# Patient Record
Sex: Female | Born: 1940 | Race: White | Hispanic: No | Marital: Married | Smoking: Former smoker
Health system: Southern US, Community
[De-identification: ages and names within clinical notes are randomized; demographics above are authoritative.]

## PROBLEM LIST (undated history)

## (undated) ENCOUNTER — Emergency Department (HOSPITAL_COMMUNITY): Payer: Medicare Other

## (undated) ENCOUNTER — Emergency Department (HOSPITAL_COMMUNITY): Admission: EM | Payer: Medicare Other | Source: Home / Self Care

## (undated) DIAGNOSIS — I10 Essential (primary) hypertension: Secondary | ICD-10-CM

## (undated) DIAGNOSIS — G629 Polyneuropathy, unspecified: Secondary | ICD-10-CM

## (undated) DIAGNOSIS — E039 Hypothyroidism, unspecified: Secondary | ICD-10-CM

## (undated) DIAGNOSIS — G8929 Other chronic pain: Secondary | ICD-10-CM

## (undated) DIAGNOSIS — I82409 Acute embolism and thrombosis of unspecified deep veins of unspecified lower extremity: Secondary | ICD-10-CM

## (undated) DIAGNOSIS — K3184 Gastroparesis: Secondary | ICD-10-CM

## (undated) DIAGNOSIS — I219 Acute myocardial infarction, unspecified: Secondary | ICD-10-CM

## (undated) DIAGNOSIS — K529 Noninfective gastroenteritis and colitis, unspecified: Secondary | ICD-10-CM

## (undated) DIAGNOSIS — M1990 Unspecified osteoarthritis, unspecified site: Secondary | ICD-10-CM

## (undated) DIAGNOSIS — E7800 Pure hypercholesterolemia, unspecified: Secondary | ICD-10-CM

## (undated) DIAGNOSIS — I2699 Other pulmonary embolism without acute cor pulmonale: Secondary | ICD-10-CM

## (undated) DIAGNOSIS — I2510 Atherosclerotic heart disease of native coronary artery without angina pectoris: Secondary | ICD-10-CM

## (undated) DIAGNOSIS — Z86718 Personal history of other venous thrombosis and embolism: Secondary | ICD-10-CM

## (undated) DIAGNOSIS — M62838 Other muscle spasm: Secondary | ICD-10-CM

## (undated) DIAGNOSIS — K5900 Constipation, unspecified: Secondary | ICD-10-CM

## (undated) DIAGNOSIS — I4891 Unspecified atrial fibrillation: Secondary | ICD-10-CM

## (undated) DIAGNOSIS — K219 Gastro-esophageal reflux disease without esophagitis: Secondary | ICD-10-CM

## (undated) HISTORY — PX: TONSILLECTOMY: SUR1361

## (undated) HISTORY — PX: WRIST SURGERY: SHX841

## (undated) HISTORY — PX: ANKLE SURGERY: SHX546

## (undated) HISTORY — PX: ABDOMINAL HYSTERECTOMY: SHX81

## (undated) HISTORY — PX: OTHER SURGICAL HISTORY: SHX169

## (undated) HISTORY — PX: CARDIAC SURGERY: SHX584

## (undated) HISTORY — PX: JOINT REPLACEMENT: SHX530

---

## 1999-01-08 ENCOUNTER — Encounter: Payer: Self-pay | Admitting: Gastroenterology

## 1999-01-08 ENCOUNTER — Ambulatory Visit (HOSPITAL_COMMUNITY): Admission: RE | Admit: 1999-01-08 | Discharge: 1999-01-08 | Payer: Self-pay | Admitting: Anesthesiology

## 1999-01-19 ENCOUNTER — Observation Stay (HOSPITAL_COMMUNITY): Admission: EM | Admit: 1999-01-19 | Discharge: 1999-01-20 | Payer: Self-pay | Admitting: Gastroenterology

## 1999-01-19 ENCOUNTER — Encounter: Payer: Self-pay | Admitting: Gastroenterology

## 2012-01-12 ENCOUNTER — Encounter (HOSPITAL_BASED_OUTPATIENT_CLINIC_OR_DEPARTMENT_OTHER): Payer: Self-pay | Admitting: *Deleted

## 2012-01-12 ENCOUNTER — Emergency Department (HOSPITAL_BASED_OUTPATIENT_CLINIC_OR_DEPARTMENT_OTHER)
Admission: EM | Admit: 2012-01-12 | Discharge: 2012-01-13 | Disposition: A | Discharge status: Home / Self Care | Payer: Medicare Other | Attending: Emergency Medicine | Admitting: Emergency Medicine

## 2012-01-12 ENCOUNTER — Emergency Department (INDEPENDENT_AMBULATORY_CARE_PROVIDER_SITE_OTHER): Payer: Medicare Other

## 2012-01-12 DIAGNOSIS — E119 Type 2 diabetes mellitus without complications: Secondary | ICD-10-CM | POA: Insufficient documentation

## 2012-01-12 DIAGNOSIS — Z794 Long term (current) use of insulin: Secondary | ICD-10-CM | POA: Insufficient documentation

## 2012-01-12 DIAGNOSIS — I10 Essential (primary) hypertension: Secondary | ICD-10-CM | POA: Insufficient documentation

## 2012-01-12 DIAGNOSIS — R7309 Other abnormal glucose: Secondary | ICD-10-CM

## 2012-01-12 DIAGNOSIS — Z9641 Presence of insulin pump (external) (internal): Secondary | ICD-10-CM | POA: Insufficient documentation

## 2012-01-12 DIAGNOSIS — I517 Cardiomegaly: Secondary | ICD-10-CM

## 2012-01-12 DIAGNOSIS — R739 Hyperglycemia, unspecified: Secondary | ICD-10-CM

## 2012-01-12 HISTORY — DX: Essential (primary) hypertension: I10

## 2012-01-12 HISTORY — DX: Unspecified osteoarthritis, unspecified site: M19.90

## 2012-01-12 HISTORY — DX: Gastroparesis: K31.84

## 2012-01-12 HISTORY — DX: Polyneuropathy, unspecified: G62.9

## 2012-01-12 HISTORY — DX: Atherosclerotic heart disease of native coronary artery without angina pectoris: I25.10

## 2012-01-12 HISTORY — DX: Other chronic pain: G89.29

## 2012-01-12 HISTORY — DX: Noninfective gastroenteritis and colitis, unspecified: K52.9

## 2012-01-12 HISTORY — DX: Gastro-esophageal reflux disease without esophagitis: K21.9

## 2012-01-12 LAB — CBC
MCH: 29.8 pg (ref 26.0–34.0)
MCHC: 33.2 g/dL (ref 30.0–36.0)
MCV: 89.7 fL (ref 78.0–100.0)
Platelets: 235 10*3/uL (ref 150–400)
RBC: 3.59 MIL/uL — ABNORMAL LOW (ref 3.87–5.11)

## 2012-01-12 LAB — BASIC METABOLIC PANEL
CO2: 30 mEq/L (ref 19–32)
Calcium: 9.3 mg/dL (ref 8.4–10.5)
Creatinine, Ser: 1.2 mg/dL — ABNORMAL HIGH (ref 0.50–1.10)
GFR calc non Af Amer: 45 mL/min — ABNORMAL LOW (ref >90)
Sodium: 134 mEq/L — ABNORMAL LOW (ref 135–145)

## 2012-01-12 LAB — GLUCOSE, CAPILLARY

## 2012-01-12 LAB — URINALYSIS, ROUTINE W REFLEX MICROSCOPIC
Glucose, UA: >1000 mg/dL — AB
Hgb urine dipstick: NEGATIVE
Ketones, ur: NEGATIVE mg/dL
Leukocytes, UA: NEGATIVE
Protein, ur: NEGATIVE mg/dL
Urobilinogen, UA: 0.2 mg/dL (ref 0.0–1.0)

## 2012-01-12 MED ORDER — SODIUM CHLORIDE 0.9 % IV SOLN: INTRAVENOUS | Status: DC

## 2012-01-12 MED ORDER — SODIUM CHLORIDE 0.9 % IV BOLUS (SEPSIS)
500.0000 mL | Freq: Once | INTRAVENOUS | Status: AC
  Administered 2012-01-12: 500 mL via INTRAVENOUS

## 2012-01-12 MED ORDER — INSULIN REGULAR HUMAN 100 UNIT/ML IJ SOLN
10.0000 [IU] | Freq: Once | INTRAMUSCULAR | Status: AC
  Administered 2012-01-12: 10 [IU] via SUBCUTANEOUS
  Filled 2012-01-12: qty 3

## 2012-01-12 NOTE — ED Notes (Signed)
Yesterday she was nauseated. Her sugar was over 600. She was able to get it down but it keeps going back up. Today she called her MD and she was told to come to the ED.

## 2012-01-12 NOTE — ED Notes (Signed)
Pt reports trouble keeping BS regulated states that it has gotten above 600 x 2 in last 2 days called PCP and was told to come to ED for assessment pt reports recent stomach virus and some nausea denies vomiting in last 48 hours

## 2012-01-12 NOTE — ED Provider Notes (Signed)
History     CSN: [id number]  Arrival date & time 01/12/12  2005   First MD Initiated Contact with Patient 01/12/12 2029      Chief Complaint  Patient presents with  . Blood Sugar Problem    (Consider location/radiation/quality/duration/timing/severity/associated sxs/prior treatment) The history is provided by the patient.   patient is a 71 year old female with known history of diabetes takes long-acting insulin for the diabetes for the past couple days she's been having trouble controlling her sugars in the range of 300- 600 no systemic symptoms no bowel pain no chest pain no shortness of breath no dysuria no fever no nausea vomiting or diarrhea.  Patient is followed by a local endocrinologist. Past Medical History  Diagnosis Date  . Diabetes mellitus   . Hypertension   . Coronary artery disease   . Colitis   . Neuropathy   . Chronic pain   . GERD (gastroesophageal reflux disease)   . Gastroparesis   . Fibrocystic breast   . Thyroid disease   . Arthritis     Past Surgical History  Procedure Date  . Abdominal hysterectomy   . Cardiac surgery   . Tonsillectomy   . Cardiac surgery   . Ankle surgery   . Wrist surgery   . Cateract surgery     No family history on file.  History  Substance Use Topics  . Smoking status: Never Smoker   . Smokeless tobacco: Not on file  . Alcohol Use: No    OB History    Grav Para Term Preterm Abortions TAB SAB Ect Mult Living                  Review of Systems  Constitutional: Negative for fever and chills.  HENT: Negative for congestion, sore throat and neck pain.   Respiratory: Negative for cough and shortness of breath.   Cardiovascular: Negative for chest pain.  Gastrointestinal: Negative for nausea, vomiting, abdominal pain and diarrhea.  Genitourinary: Negative for dysuria and hematuria.  Musculoskeletal: Negative for back pain.  Skin: Negative for rash and wound.  Neurological: Negative for headaches.    Hematological: Does not bruise/bleed easily.  Psychiatric/Behavioral: Negative for confusion.    Allergies  Sulfa antibiotics  Home Medications   Current Outpatient Rx  Name Route Sig Dispense Refill  . VITAMIN C PO Oral Take 1 tablet by mouth daily.    . ASPIRIN 81 MG PO TABS Oral Take 81 mg by mouth daily.    Marland Kitchen CALCIUM ACETATE PO Oral Take 1 tablet by mouth daily.    . FUROSEMIDE 20 MG PO TABS Oral Take 20 mg by mouth 2 (two) times daily.    Marland Kitchen GABAPENTIN 300 MG PO CAPS Oral Take 300 mg by mouth 3 (three) times daily.    . INSULIN PUMP Subcutaneous Inject 22.15 each into the skin 4 (four) times daily. Insulin pump:  @ midnight .09 units of insulin, @ 3:30 am 1.0 units of insulin, @ 9 am .80 units of insulin, @ 6 pm 1.05 units of insulin.    Marland Kitchen LEVOTHYROXINE SODIUM 112 MCG PO TABS Oral Take 112 mcg by mouth daily.    Marland Kitchen LISINOPRIL 20 MG PO TABS Oral Take 20 mg by mouth daily.    Marland Kitchen MESALAMINE 800 MG PO TBEC Oral Take 1 tablet by mouth daily.    Marland Kitchen METOPROLOL SUCCINATE ER 25 MG PO TB24 Oral Take 25 mg by mouth daily.    Marland Kitchen MODAFINIL 200 MG PO TABS  Oral Take 200 mg by mouth daily.    Marland Kitchen OMEPRAZOLE 40 MG PO CPDR Oral Take 40 mg by mouth daily.    . OXYCODONE-ACETAMINOPHEN 5-325 MG PO TABS Oral Take 1 tablet by mouth every 4 (four) hours as needed. Patient uses this medication for pain.    Marland Kitchen OXYMORPHONE ER (CRUSH RESIST) 40 MG PO TB12 Oral Take 1 tablet by mouth daily.    Marland Kitchen POLYETHYLENE GLYCOL 3350 PO PACK Oral Take 17 g by mouth daily.    Marland Kitchen ROSUVASTATIN CALCIUM 5 MG PO TABS Oral Take 5 mg by mouth daily. Patient takes 2.5 mg of this medication every night, except for on Wednesday and Friday.  Then she takes the 5 mg tablet.    Marland Kitchen UREA 40 % EX CREA Topical Apply 1 application topically daily. Patient applies this medication to her feet.    . WARFARIN SODIUM 3 MG PO TABS Oral Take 3 mg by mouth daily. Anti-coagulant.  Patient needs a consult.  Patient states that she takes 1 and half tablets on  everyday except Sunday.    Marland Kitchen ZINC GLUCONATE 50 MG PO TABS Oral Take 50 mg by mouth daily.      BP 160/58  Pulse 78  Temp(Src) 98.2 F (36.8 C) (Oral)  Resp 20  Wt 240 lb (108.863 kg)  SpO2 99%  Physical Exam  Nursing note and vitals reviewed. Constitutional: She is oriented to person, place, and time. She appears well-developed and well-nourished. No distress.  HENT:  Head: Normocephalic and atraumatic.  Mouth/Throat: Oropharynx is clear and moist. No oropharyngeal exudate.  Eyes: Conjunctivae and EOM are normal. Pupils are equal, round, and reactive to light. No scleral icterus.  Neck: Normal range of motion. Neck supple.  Cardiovascular: Normal rate, regular rhythm and normal heart sounds.   No murmur heard. Pulmonary/Chest: Effort normal and breath sounds normal. No respiratory distress. She has no wheezes. She has no rales.  Abdominal: Soft. Bowel sounds are normal. There is no tenderness.  Musculoskeletal: Normal range of motion. She exhibits no tenderness.  Lymphadenopathy:    She has no cervical adenopathy.  Neurological: She is alert and oriented to person, place, and time. No cranial nerve deficit. She exhibits normal muscle tone. Coordination normal.  Skin: Skin is warm. No rash noted.    ED Course  Procedures (including critical care time)  Labs Reviewed  GLUCOSE, CAPILLARY - Abnormal; Notable for the following:    Glucose-Capillary 407 (*)    All other components within normal limits  CBC - Abnormal; Notable for the following:    RBC 3.59 (*)    Hemoglobin 10.7 (*)    HCT 32.2 (*)    All other components within normal limits  BASIC METABOLIC PANEL - Abnormal; Notable for the following:    Sodium 134 (*)    Potassium 5.3 (*)    Glucose, Bld 428 (*)    Creatinine, Ser 1.20 (*)    GFR calc non Af Amer 45 (*)    GFR calc Af Amer 53 (*)    All other components within normal limits  URINALYSIS, ROUTINE W REFLEX MICROSCOPIC - Abnormal; Notable for the  following:    Glucose, UA >1000 (*)    All other components within normal limits  GLUCOSE, CAPILLARY - Abnormal; Notable for the following:    Glucose-Capillary 490 (*)    All other components within normal limits  URINE MICROSCOPIC-ADD ON   Dg Chest 2 View  01/12/2012  *RADIOLOGY REPORT*  Clinical Data: Hyperglycemia  CHEST - 2 VIEW  Comparison: None.  Findings: Bronchitic changes.  No focal consolidation.  No pleural effusion or pneumothorax.  Heart size upper normal to mildly enlarged.  Aortic arch atherosclerosis.  No acute osseous abnormality.  IMPRESSION: Bronchitic change without focal consolidation.  There heart size is upper normal to mildly enlarged.  Original Report Authenticated By: Waneta Martins, M.D.     1. Hyperglycemia       MDM  Patient with known diabetes for the past few days and having trouble controlling her blood sugars she is followed by endocrinology and also has a primary care Dr. The medication she takes for her diabetes is NovoLog she's been supplementing that with a sliding scale to help control her blood sugars have gone as high as 600 the remaining in the 300-600 range. No evidence of systemic infection or complaint no history of rest for infection cough abdominal pain nausea vomiting or diarrhea fever or dysuria.  Lab work up here tonight showing blood sugars very much in the 400 range despite IV fluids and 10 units of regular insulin IV no evidence of metabolic acidosis no evidence of pneumonia on chest x-ray no fever no sniffing and leukocytosis mild elevation in potassium which may be a little bit of hemolysis. No urinary tract infection. Patient first to go home and followup with her endocrinologist in the morning.       Shelda Jakes, MD 01/12/12 [phone]

## 2012-01-12 NOTE — Discharge Instructions (Signed)
Blood sugar slightly improved in the emergency department from which she had at home but no significant improvement still in the 400 range no evidence of infection in the lungs no evidence of infection in the urine no significant leukocytosis no significant lead actually abnormalities other than a slight increase in potassium no evidence of metabolic acidosis. Patient does have an endocrinologist in followup by phone with them tomorrow suspect that NovoLog will need to be increased and then supplemented with a sliding scale. But preferred to allow them to make that decision. Patient needs to return for new or worse symptoms.

## 2012-07-04 ENCOUNTER — Emergency Department (HOSPITAL_BASED_OUTPATIENT_CLINIC_OR_DEPARTMENT_OTHER)
Admission: EM | Admit: 2012-07-04 | Discharge: 2012-07-04 | Disposition: A | Discharge status: Home / Self Care | Payer: Medicare Other | Attending: Emergency Medicine | Admitting: Emergency Medicine

## 2012-07-04 ENCOUNTER — Encounter (HOSPITAL_BASED_OUTPATIENT_CLINIC_OR_DEPARTMENT_OTHER): Payer: Self-pay | Admitting: *Deleted

## 2012-07-04 DIAGNOSIS — R739 Hyperglycemia, unspecified: Secondary | ICD-10-CM

## 2012-07-04 DIAGNOSIS — E119 Type 2 diabetes mellitus without complications: Secondary | ICD-10-CM | POA: Insufficient documentation

## 2012-07-04 DIAGNOSIS — Z79899 Other long term (current) drug therapy: Secondary | ICD-10-CM | POA: Insufficient documentation

## 2012-07-04 DIAGNOSIS — E079 Disorder of thyroid, unspecified: Secondary | ICD-10-CM | POA: Insufficient documentation

## 2012-07-04 DIAGNOSIS — G8929 Other chronic pain: Secondary | ICD-10-CM | POA: Insufficient documentation

## 2012-07-04 DIAGNOSIS — Z7901 Long term (current) use of anticoagulants: Secondary | ICD-10-CM | POA: Insufficient documentation

## 2012-07-04 DIAGNOSIS — I10 Essential (primary) hypertension: Secondary | ICD-10-CM | POA: Insufficient documentation

## 2012-07-04 DIAGNOSIS — Z9641 Presence of insulin pump (external) (internal): Secondary | ICD-10-CM | POA: Insufficient documentation

## 2012-07-04 DIAGNOSIS — K219 Gastro-esophageal reflux disease without esophagitis: Secondary | ICD-10-CM | POA: Insufficient documentation

## 2012-07-04 DIAGNOSIS — I2510 Atherosclerotic heart disease of native coronary artery without angina pectoris: Secondary | ICD-10-CM | POA: Insufficient documentation

## 2012-07-04 DIAGNOSIS — M129 Arthropathy, unspecified: Secondary | ICD-10-CM | POA: Insufficient documentation

## 2012-07-04 LAB — CBC WITH DIFFERENTIAL/PLATELET
Basophils Relative: 1 % (ref 0–1)
HCT: 28.7 % — ABNORMAL LOW (ref 36.0–46.0)
Hemoglobin: 9.5 g/dL — ABNORMAL LOW (ref 12.0–15.0)
Lymphocytes Relative: 20 % (ref 12–46)
Lymphs Abs: 1.6 10*3/uL (ref 0.7–4.0)
Monocytes Relative: 9 % (ref 3–12)
Neutro Abs: 5.8 10*3/uL (ref 1.7–7.7)
Neutrophils Relative %: 71 % (ref 43–77)
RBC: 3.14 MIL/uL — ABNORMAL LOW (ref 3.87–5.11)

## 2012-07-04 LAB — URINALYSIS, ROUTINE W REFLEX MICROSCOPIC
Glucose, UA: 250 mg/dL — AB
Hgb urine dipstick: NEGATIVE
Specific Gravity, Urine: 1.024 (ref 1.005–1.030)
Urobilinogen, UA: 0.2 mg/dL (ref 0.0–1.0)
pH: 5 (ref 5.0–8.0)

## 2012-07-04 LAB — POCT I-STAT 3, VENOUS BLOOD GAS (G3P V)
TCO2: 26 mmol/L (ref 0–100)
pCO2, Ven: 45.1 mmHg (ref 45.0–50.0)
pH, Ven: 7.346 — ABNORMAL HIGH (ref 7.250–7.300)
pO2, Ven: 30 mmHg (ref 30.0–45.0)

## 2012-07-04 LAB — URINE MICROSCOPIC-ADD ON

## 2012-07-04 LAB — BASIC METABOLIC PANEL
Chloride: 94 mEq/L — ABNORMAL LOW (ref 96–112)
GFR calc Af Amer: 43 mL/min — ABNORMAL LOW (ref >90)
GFR calc non Af Amer: 37 mL/min — ABNORMAL LOW (ref >90)
Potassium: 4.1 mEq/L (ref 3.5–5.1)
Sodium: 132 mEq/L — ABNORMAL LOW (ref 135–145)

## 2012-07-04 MED ORDER — INSULIN REGULAR HUMAN 100 UNIT/ML IJ SOLN
INTRAMUSCULAR | Status: AC
  Filled 2012-07-04: qty 1

## 2012-07-04 MED ORDER — SODIUM CHLORIDE 0.9 % IV SOLN
INTRAVENOUS | Status: DC
  Administered 2012-07-04 (×2): via INTRAVENOUS

## 2012-07-04 MED ORDER — INSULIN ASPART 100 UNIT/ML ~~LOC~~ SOLN
10.0000 [IU] | Freq: Once | SUBCUTANEOUS | Status: AC
  Administered 2012-07-04: 10 [IU] via SUBCUTANEOUS
  Filled 2012-07-04: qty 3

## 2012-07-04 MED ORDER — SODIUM CHLORIDE 0.9 % IV BOLUS (SEPSIS): 1000.0000 mL | Freq: Once | INTRAVENOUS | Status: DC

## 2012-07-04 NOTE — ED Notes (Signed)
Pt c/o increased BS at home 400

## 2012-07-04 NOTE — ED Provider Notes (Signed)
History     CSN: [id number]  Arrival date & time 07/04/12  1513   First MD Initiated Contact with Patient 07/04/12 1530      Chief Complaint  Patient presents with  . Hyperglycemia    (Consider location/radiation/quality/duration/timing/severity/associated sxs/prior treatment) The history is provided by the patient.   patient complains of having hyperglycemia x2 days. Notes noncompliance with diabetic diet. Notes polyuria and polydipsia. Denies any fever, vomiting, diarrhea. States that her endocrinologist adjusted her insulin pump last week. States that she does have chronic pain and this seen at pain clinic for this and feels that her hyperglycemia is due to increased pain that she sustained from a weekend camping trip. States her sugar yesterday was 600 and she took extra insulin and she was able to have his sugar go down into the 400s.  Past Medical History  Diagnosis Date  . Diabetes mellitus   . Hypertension   . Coronary artery disease   . Colitis   . Neuropathy   . Chronic pain   . GERD (gastroesophageal reflux disease)   . Gastroparesis   . Fibrocystic breast   . Thyroid disease   . Arthritis     Past Surgical History  Procedure Date  . Abdominal hysterectomy   . Cardiac surgery   . Tonsillectomy   . Cardiac surgery   . Ankle surgery   . Wrist surgery   . Cateract surgery     History reviewed. No pertinent family history.  History  Substance Use Topics  . Smoking status: Never Smoker   . Smokeless tobacco: Not on file  . Alcohol Use: No    OB History    Grav Para Term Preterm Abortions TAB SAB Ect Mult Living                  Review of Systems  All other systems reviewed and are negative.    Allergies  Sulfa antibiotics  Home Medications   Current Outpatient Rx  Name Route Sig Dispense Refill  . VITAMIN C PO Oral Take 1 tablet by mouth daily.    . ASPIRIN 81 MG PO TABS Oral Take 81 mg by mouth daily.    Marland Kitchen CALCIUM ACETATE PO Oral Take 1  tablet by mouth daily.    . FUROSEMIDE 20 MG PO TABS Oral Take 20 mg by mouth 2 (two) times daily.    Marland Kitchen GABAPENTIN 300 MG PO CAPS Oral Take 300 mg by mouth 3 (three) times daily.    . INSULIN PUMP Subcutaneous Inject 22.15 each into the skin 4 (four) times daily. Insulin pump:  @ midnight .09 units of insulin, @ 3:30 am 1.0 units of insulin, @ 9 am .80 units of insulin, @ 6 pm 1.05 units of insulin.    Marland Kitchen LEVOTHYROXINE SODIUM 112 MCG PO TABS Oral Take 112 mcg by mouth daily.    Marland Kitchen LISINOPRIL 20 MG PO TABS Oral Take 20 mg by mouth daily.    Marland Kitchen MESALAMINE 800 MG PO TBEC Oral Take 1 tablet by mouth daily.    Marland Kitchen METOPROLOL SUCCINATE ER 25 MG PO TB24 Oral Take 25 mg by mouth daily.    Marland Kitchen MODAFINIL 200 MG PO TABS Oral Take 200 mg by mouth daily.    Marland Kitchen OMEPRAZOLE 40 MG PO CPDR Oral Take 40 mg by mouth daily.    . OXYCODONE-ACETAMINOPHEN 5-325 MG PO TABS Oral Take 1 tablet by mouth every 4 (four) hours as needed. Patient uses this medication for  pain.    Marland Kitchen OXYMORPHONE ER (CRUSH RESIST) 40 MG PO TB12 Oral Take 1 tablet by mouth daily.    Marland Kitchen POLYETHYLENE GLYCOL 3350 PO PACK Oral Take 17 g by mouth daily.    Marland Kitchen ROSUVASTATIN CALCIUM 5 MG PO TABS Oral Take 5 mg by mouth daily. Patient takes 2.5 mg of this medication every night, except for on Wednesday and Friday.  Then she takes the 5 mg tablet.    Marland Kitchen UREA 40 % EX CREA Topical Apply 1 application topically daily. Patient applies this medication to her feet.    . WARFARIN SODIUM 3 MG PO TABS Oral Take 3 mg by mouth daily. Anti-coagulant.  Patient needs a consult.  Patient states that she takes 1 and half tablets on everyday except Sunday.    Marland Kitchen ZINC GLUCONATE 50 MG PO TABS Oral Take 50 mg by mouth daily.      BP 124/56  Pulse 81  Temp 98.1 F (36.7 C) (Oral)  Resp 16  Ht 5\' 3"  (1.6 m)  Wt 250 lb (113.399 kg)  BMI 44.29 kg/m2  SpO2 100%  Physical Exam  Nursing note and vitals reviewed. Constitutional: She is oriented to person, place, and time. Vital signs  are normal. She appears well-developed and well-nourished.  Non-toxic appearance. No distress.  HENT:  Head: Normocephalic and atraumatic.  Eyes: Conjunctivae, EOM and lids are normal. Pupils are equal, round, and reactive to light.  Neck: Normal range of motion. Neck supple. No tracheal deviation present. No mass present.  Cardiovascular: Normal rate, regular rhythm and normal heart sounds.  Exam reveals no gallop.   No murmur heard. Pulmonary/Chest: Effort normal and breath sounds normal. No stridor. No respiratory distress. She has no decreased breath sounds. She has no wheezes. She has no rhonchi. She has no rales.  Abdominal: Soft. Normal appearance and bowel sounds are normal. She exhibits no distension. There is no tenderness. There is no rebound and no CVA tenderness.  Musculoskeletal: Normal range of motion. She exhibits no edema and no tenderness.  Neurological: She is alert and oriented to person, place, and time. She has normal strength. No cranial nerve deficit or sensory deficit. GCS eye subscore is 4. GCS verbal subscore is 5. GCS motor subscore is 6.  Skin: Skin is warm and dry. No abrasion and no rash noted.  Psychiatric: She has a normal mood and affect. Her speech is normal and behavior is normal.    ED Course  Procedures (including critical care time)  Labs Reviewed  GLUCOSE, CAPILLARY - Abnormal; Notable for the following:    Glucose-Capillary 436 (*)     All other components within normal limits  BASIC METABOLIC PANEL  CBC WITH DIFFERENTIAL  URINALYSIS, ROUTINE W REFLEX MICROSCOPIC  BLOOD GAS, VENOUS   No results found.   No diagnosis found.    MDM  Patient given IV fluids and insulin. Blood sugar has improved. She will followup with her endocrinologist tomorrow        Toy Baker, MD 07/04/12 1818

## 2013-02-10 ENCOUNTER — Emergency Department (HOSPITAL_BASED_OUTPATIENT_CLINIC_OR_DEPARTMENT_OTHER): Payer: Medicare Other

## 2013-02-10 ENCOUNTER — Encounter (HOSPITAL_BASED_OUTPATIENT_CLINIC_OR_DEPARTMENT_OTHER): Payer: Self-pay | Admitting: *Deleted

## 2013-02-10 ENCOUNTER — Emergency Department (HOSPITAL_BASED_OUTPATIENT_CLINIC_OR_DEPARTMENT_OTHER)
Admission: EM | Admit: 2013-02-10 | Discharge: 2013-02-10 | Disposition: A | Discharge status: Home / Self Care | Payer: Medicare Other | Attending: Emergency Medicine | Admitting: Emergency Medicine

## 2013-02-10 DIAGNOSIS — I10 Essential (primary) hypertension: Secondary | ICD-10-CM | POA: Insufficient documentation

## 2013-02-10 DIAGNOSIS — Z8669 Personal history of other diseases of the nervous system and sense organs: Secondary | ICD-10-CM | POA: Insufficient documentation

## 2013-02-10 DIAGNOSIS — Z8739 Personal history of other diseases of the musculoskeletal system and connective tissue: Secondary | ICD-10-CM | POA: Insufficient documentation

## 2013-02-10 DIAGNOSIS — K219 Gastro-esophageal reflux disease without esophagitis: Secondary | ICD-10-CM | POA: Insufficient documentation

## 2013-02-10 DIAGNOSIS — S63501A Unspecified sprain of right wrist, initial encounter: Secondary | ICD-10-CM

## 2013-02-10 DIAGNOSIS — Z7982 Long term (current) use of aspirin: Secondary | ICD-10-CM | POA: Insufficient documentation

## 2013-02-10 DIAGNOSIS — Z8742 Personal history of other diseases of the female genital tract: Secondary | ICD-10-CM | POA: Insufficient documentation

## 2013-02-10 DIAGNOSIS — S63509A Unspecified sprain of unspecified wrist, initial encounter: Secondary | ICD-10-CM | POA: Insufficient documentation

## 2013-02-10 DIAGNOSIS — Z79899 Other long term (current) drug therapy: Secondary | ICD-10-CM | POA: Insufficient documentation

## 2013-02-10 DIAGNOSIS — I2510 Atherosclerotic heart disease of native coronary artery without angina pectoris: Secondary | ICD-10-CM | POA: Insufficient documentation

## 2013-02-10 DIAGNOSIS — Z8719 Personal history of other diseases of the digestive system: Secondary | ICD-10-CM | POA: Insufficient documentation

## 2013-02-10 DIAGNOSIS — E079 Disorder of thyroid, unspecified: Secondary | ICD-10-CM | POA: Insufficient documentation

## 2013-02-10 DIAGNOSIS — Z794 Long term (current) use of insulin: Secondary | ICD-10-CM | POA: Insufficient documentation

## 2013-02-10 DIAGNOSIS — Y9289 Other specified places as the place of occurrence of the external cause: Secondary | ICD-10-CM | POA: Insufficient documentation

## 2013-02-10 DIAGNOSIS — S93401A Sprain of unspecified ligament of right ankle, initial encounter: Secondary | ICD-10-CM

## 2013-02-10 DIAGNOSIS — G8929 Other chronic pain: Secondary | ICD-10-CM | POA: Insufficient documentation

## 2013-02-10 DIAGNOSIS — R5381 Other malaise: Secondary | ICD-10-CM | POA: Insufficient documentation

## 2013-02-10 DIAGNOSIS — Z7901 Long term (current) use of anticoagulants: Secondary | ICD-10-CM | POA: Insufficient documentation

## 2013-02-10 DIAGNOSIS — W1789XA Other fall from one level to another, initial encounter: Secondary | ICD-10-CM | POA: Insufficient documentation

## 2013-02-10 DIAGNOSIS — Y9389 Activity, other specified: Secondary | ICD-10-CM | POA: Insufficient documentation

## 2013-02-10 DIAGNOSIS — R5383 Other fatigue: Secondary | ICD-10-CM | POA: Insufficient documentation

## 2013-02-10 DIAGNOSIS — E119 Type 2 diabetes mellitus without complications: Secondary | ICD-10-CM | POA: Insufficient documentation

## 2013-02-10 DIAGNOSIS — S93409A Sprain of unspecified ligament of unspecified ankle, initial encounter: Secondary | ICD-10-CM | POA: Insufficient documentation

## 2013-02-10 MED ORDER — HYDROMORPHONE HCL PF 2 MG/ML IJ SOLN
2.0000 mg | Freq: Once | INTRAMUSCULAR | Status: AC
  Administered 2013-02-10: 2 mg via INTRAMUSCULAR
  Filled 2013-02-10: qty 1

## 2013-02-10 NOTE — ED Provider Notes (Addendum)
History     CSN: [id number]  Arrival date & time 02/10/13  5366   First MD Initiated Contact with Patient 02/10/13 [phone]      Chief Complaint  Patient presents with  . Fall    (Consider location/radiation/quality/duration/timing/severity/associated sxs/prior treatment) Patient is a 72 y.o. female presenting with fall. The history is provided by the patient.  Fall The accident occurred yesterday. Fall occurred: she was working outside and felt weak and her right ankle locked up on her and she fell in the gravel. She fell from a height of 1 to 2 ft. She landed on dirt. There was no blood loss. The point of impact was the left wrist (no head injury.  ). Pain location: right ankle and left wrist. The pain is at a severity of 9/10. The pain is severe. She was not ambulatory at the scene (husband had to pick her up and take her to the house). Pertinent negatives include no abdominal pain, no nausea, no vomiting, no headaches and no loss of consciousness. The symptoms are aggravated by use of the injured limb, standing, ambulation and pressure on the injury. She has tried ice and elevation for the symptoms. The treatment provided no relief.    Past Medical History  Diagnosis Date  . Diabetes mellitus   . Hypertension   . Coronary artery disease   . Colitis   . Neuropathy   . Chronic pain   . GERD (gastroesophageal reflux disease)   . Gastroparesis   . Fibrocystic breast   . Thyroid disease   . Arthritis     Past Surgical History  Procedure Laterality Date  . Abdominal hysterectomy    . Cardiac surgery    . Tonsillectomy    . Cardiac surgery    . Ankle surgery    . Wrist surgery    . Cateract surgery      No family history on file.  History  Substance Use Topics  . Smoking status: Never Smoker   . Smokeless tobacco: Not on file  . Alcohol Use: No    OB History   Grav Para Term Preterm Abortions TAB SAB Ect Mult Living                  Review of Systems   Gastrointestinal: Negative for nausea, vomiting and abdominal pain.  Neurological: Negative for loss of consciousness and headaches.  All other systems reviewed and are negative.    Allergies  Sulfa antibiotics  Home Medications   Current Outpatient Rx  Name  Route  Sig  Dispense  Refill  . Armodafinil (NUVIGIL) 250 MG tablet   Oral   Take 250 mg by mouth daily.         . Ascorbic Acid (VITAMIN C PO)   Oral   Take 1 tablet by mouth daily.         Marland Kitchen aspirin 81 MG tablet   Oral   Take 81 mg by mouth daily.         . Calcium Acetate, Phos Binder, (CALCIUM ACETATE PO)   Oral   Take 1 tablet by mouth daily.         . diclofenac (FLECTOR) 1.3 % PTCH   Transdermal   Place 1 patch onto the skin 2 (two) times daily. Patient uses this on her back.         . furosemide (LASIX) 20 MG tablet   Oral   Take 20 mg by mouth 2 (two) times  daily.         . gabapentin (NEURONTIN) 300 MG capsule   Oral   Take 300 mg by mouth 3 (three) times daily.         . Insulin Human (INSULIN PUMP) 100 unit/ml SOLN   Subcutaneous   Inject 22.15 each into the skin 4 (four) times daily. Insulin pump:  @ midnight .09 units of insulin, @ 3:30 am 1.0 units of insulin, @ 9 am .80 units of insulin, @ 6 pm 1.05 units of insulin.         Marland Kitchen levothyroxine (SYNTHROID, LEVOTHROID) 112 MCG tablet   Oral   Take 112 mcg by mouth daily.         Marland Kitchen lidocaine (LIDODERM) 5 %   Transdermal   Place 1 patch onto the skin daily. Remove & Discard patch within 12 hours or as directed by MD         . lisinopril (PRINIVIL,ZESTRIL) 20 MG tablet   Oral   Take 20 mg by mouth daily.         . Mesalamine (ASACOL HD) 800 MG TBEC   Oral   Take 1 tablet by mouth daily.         . metoprolol succinate (TOPROL-XL) 25 MG 24 hr tablet   Oral   Take 25 mg by mouth daily.         . modafinil (PROVIGIL) 200 MG tablet   Oral   Take 200 mg by mouth daily.         . Multiple Vitamins-Minerals  (MULTIVITAMIN PO)   Oral   Take 1 tablet by mouth daily.         Marland Kitchen omeprazole (PRILOSEC) 40 MG capsule   Oral   Take 40 mg by mouth daily.         . Ondansetron HCl (ZOFRAN PO)   Oral   Take 1 tablet by mouth daily as needed. For nausea.         Marland Kitchen oxyCODONE-acetaminophen (PERCOCET) 5-325 MG per tablet   Oral   Take 1 tablet by mouth every 4 (four) hours as needed. Patient uses this medication for pain.         . Oxymorphone HCl, Crush Resist, (OPANA ER, CRUSH RESISTANT,) 40 MG TB12   Oral   Take 1 tablet by mouth daily.         . polyethylene glycol (MIRALAX / GLYCOLAX) packet   Oral   Take 17 g by mouth daily.         . rosuvastatin (CRESTOR) 5 MG tablet   Oral   Take 5 mg by mouth daily. Patient takes 2.5 mg of this medication every night, except for on Wednesday and Friday.  Then she takes the 5 mg tablet.         Marland Kitchen tiZANidine (ZANAFLEX) 4 MG tablet   Oral   Take 4 mg by mouth 3 (three) times daily.         . urea (CARMOL) 40 % CREA   Topical   Apply 1 application topically daily. Patient applies this medication to her feet.         . vitamin E 100 UNIT capsule   Oral   Take 100 Units by mouth daily.         Marland Kitchen warfarin (COUMADIN) 3 MG tablet   Oral   Take 3 mg by mouth daily. Anti-coagulant.  Patient needs a consult.  Patient states that she takes 1 and  half tablets on everyday except Sunday.         . zinc gluconate 50 MG tablet   Oral   Take 50 mg by mouth daily.           BP 126/56  Pulse 68  Temp(Src) 98.1 F (36.7 C) (Oral)  Resp 18  SpO2 95%  Physical Exam  Nursing note and vitals reviewed. Constitutional: She is oriented to person, place, and time. She appears well-developed and well-nourished. No distress.  Morbid obesity  HENT:  Head: Normocephalic and atraumatic.  Mouth/Throat: Oropharynx is clear and moist.  Eyes: Conjunctivae and EOM are normal. Pupils are equal, round, and reactive to light.  Neck: Normal range  of motion. Neck supple. No spinous process tenderness and no muscular tenderness present.  Cardiovascular: Normal rate, regular rhythm and intact distal pulses.   No murmur heard. Pulmonary/Chest: Effort normal and breath sounds normal. No respiratory distress. She has no wheezes. She has no rales. She exhibits no tenderness.  Abdominal: Soft. She exhibits no distension. There is no tenderness. There is no rebound and no guarding.  Musculoskeletal: She exhibits tenderness. She exhibits no edema.       Left wrist: She exhibits tenderness and bony tenderness. She exhibits normal range of motion and no swelling.       Right ankle: She exhibits decreased range of motion, swelling and ecchymosis. She exhibits no deformity and normal pulse. Tenderness. Lateral malleolus, head of 5th metatarsal and proximal fibula tenderness found.  Tenderness along the radial aspect of the left wrist with mild snuffbox tenderness.  Normal 2+ radial pulse  Neurological: She is alert and oriented to person, place, and time.  Skin: Skin is warm and dry. No rash noted. No erythema.  Psychiatric: She has a normal mood and affect. Her behavior is normal.    ED Course  Procedures (including critical care time)  Labs Reviewed - No data to display Dg Wrist Complete Left  02/10/2013  *RADIOLOGY REPORT*  Clinical Data: Fall, left wrist pain near scaphoid  LEFT WRIST - COMPLETE 3+ VIEW  Comparison: None.  Findings: No fracture or dislocation is seen.  Degenerative changes of the scaphoid triquetral joint and first carpometacarpal joint.  The visualized soft tissues are unremarkable.  IMPRESSION: No fracture or dislocation is seen.  If the patient has continued pain in the anatomic snuff box, consider follow-up MRI or delayed radiographs.   Original Report Authenticated By: Charline Bills, M.D.    Dg Tibia/fibula Right  02/10/2013  *RADIOLOGY REPORT*  Clinical Data: Fall, right lower leg pain  RIGHT TIBIA AND FIBULA - 2 VIEW   Comparison: None.  Findings: No fracture or dislocation is seen.  Right total knee arthroplasty, incompletely visualized but without evidence of complication.  The visualized soft tissues are unremarkable.  IMPRESSION: No fracture or dislocation is seen.   Original Report Authenticated By: Charline Bills, M.D.    Dg Ankle Complete Right  02/10/2013  *RADIOLOGY REPORT*  Clinical Data: Fall, right ankle pain  RIGHT ANKLE - COMPLETE 3+ VIEW  Comparison: None.  Findings: No fracture or dislocation is seen.  The ankle mortise is intact.  The base of the fifth metatarsal is unremarkable.  The visualized soft tissues are unremarkable.  IMPRESSION: No fracture or dislocation is seen.   Original Report Authenticated By: Charline Bills, M.D.      1. Ankle sprain, right, initial encounter   2. Wrist sprain, right, initial encounter       MDM  Patient with a mechanical fall yesterday while working outside. She states she started feeling very weak and her ankle locked up. Husband states at that time her blood sugar was 58. She felt much better after eating but has had persistent right ankle and left wrist pain. Her right ankle is swollen and tender also with some mild fibular head tenderness. She has been unable to bear weight. Also tenderness in the left wrist including mild snuffbox tenderness. Pt denies LOC or head injury and last coumadin was checked in the last week and 2.6.  Plain films of the ankle, tib-fib and wrist pending  11:22 AM No acute fractures.  Pt placed in thumb spica and cam walker for ankle sprain.      Gwyneth Sprout, MD 02/10/13 1122  Gwyneth Sprout, MD 02/10/13 1123

## 2013-02-10 NOTE — ED Notes (Signed)
Thumb spica placed on patient's left wrist.  CMS present per and post application.  Patient and husband verbalized understanding of it's use, application and removal.  CAM walker placed on the patient's RIGHT leg per patient complaint.  CMS present pre and post application.  Patient and husband verbalized understanding of it's use, application and removal.  Patient d/c via wheelchair.

## 2013-02-10 NOTE — ED Notes (Signed)
Patient fell while working outside, c/o R ankle & L hand pain

## 2013-07-20 ENCOUNTER — Emergency Department (HOSPITAL_BASED_OUTPATIENT_CLINIC_OR_DEPARTMENT_OTHER): Payer: Medicare Other

## 2013-07-20 ENCOUNTER — Emergency Department (HOSPITAL_BASED_OUTPATIENT_CLINIC_OR_DEPARTMENT_OTHER)
Admission: EM | Admit: 2013-07-20 | Discharge: 2013-07-20 | Disposition: A | Discharge status: Home / Self Care | Payer: Medicare Other | Attending: Emergency Medicine | Admitting: Emergency Medicine

## 2013-07-20 ENCOUNTER — Encounter (HOSPITAL_BASED_OUTPATIENT_CLINIC_OR_DEPARTMENT_OTHER): Payer: Self-pay

## 2013-07-20 DIAGNOSIS — Y9301 Activity, walking, marching and hiking: Secondary | ICD-10-CM | POA: Insufficient documentation

## 2013-07-20 DIAGNOSIS — Y92009 Unspecified place in unspecified non-institutional (private) residence as the place of occurrence of the external cause: Secondary | ICD-10-CM | POA: Insufficient documentation

## 2013-07-20 DIAGNOSIS — Z79899 Other long term (current) drug therapy: Secondary | ICD-10-CM | POA: Insufficient documentation

## 2013-07-20 DIAGNOSIS — S52109A Unspecified fracture of upper end of unspecified radius, initial encounter for closed fracture: Secondary | ICD-10-CM | POA: Insufficient documentation

## 2013-07-20 DIAGNOSIS — G8929 Other chronic pain: Secondary | ICD-10-CM | POA: Insufficient documentation

## 2013-07-20 DIAGNOSIS — S62009A Unspecified fracture of navicular [scaphoid] bone of unspecified wrist, initial encounter for closed fracture: Secondary | ICD-10-CM | POA: Insufficient documentation

## 2013-07-20 DIAGNOSIS — Z7901 Long term (current) use of anticoagulants: Secondary | ICD-10-CM | POA: Insufficient documentation

## 2013-07-20 DIAGNOSIS — K219 Gastro-esophageal reflux disease without esophagitis: Secondary | ICD-10-CM | POA: Insufficient documentation

## 2013-07-20 DIAGNOSIS — S62002A Unspecified fracture of navicular [scaphoid] bone of left wrist, initial encounter for closed fracture: Secondary | ICD-10-CM

## 2013-07-20 DIAGNOSIS — E119 Type 2 diabetes mellitus without complications: Secondary | ICD-10-CM | POA: Insufficient documentation

## 2013-07-20 DIAGNOSIS — Z8739 Personal history of other diseases of the musculoskeletal system and connective tissue: Secondary | ICD-10-CM | POA: Insufficient documentation

## 2013-07-20 DIAGNOSIS — S52121A Displaced fracture of head of right radius, initial encounter for closed fracture: Secondary | ICD-10-CM

## 2013-07-20 DIAGNOSIS — W19XXXA Unspecified fall, initial encounter: Secondary | ICD-10-CM

## 2013-07-20 DIAGNOSIS — Z8719 Personal history of other diseases of the digestive system: Secondary | ICD-10-CM | POA: Insufficient documentation

## 2013-07-20 DIAGNOSIS — I2510 Atherosclerotic heart disease of native coronary artery without angina pectoris: Secondary | ICD-10-CM | POA: Insufficient documentation

## 2013-07-20 DIAGNOSIS — Z794 Long term (current) use of insulin: Secondary | ICD-10-CM | POA: Insufficient documentation

## 2013-07-20 DIAGNOSIS — I10 Essential (primary) hypertension: Secondary | ICD-10-CM | POA: Insufficient documentation

## 2013-07-20 DIAGNOSIS — Z7982 Long term (current) use of aspirin: Secondary | ICD-10-CM | POA: Insufficient documentation

## 2013-07-20 DIAGNOSIS — Z8742 Personal history of other diseases of the female genital tract: Secondary | ICD-10-CM | POA: Insufficient documentation

## 2013-07-20 DIAGNOSIS — E079 Disorder of thyroid, unspecified: Secondary | ICD-10-CM | POA: Insufficient documentation

## 2013-07-20 DIAGNOSIS — W010XXA Fall on same level from slipping, tripping and stumbling without subsequent striking against object, initial encounter: Secondary | ICD-10-CM | POA: Insufficient documentation

## 2013-07-20 DIAGNOSIS — Z8669 Personal history of other diseases of the nervous system and sense organs: Secondary | ICD-10-CM | POA: Insufficient documentation

## 2013-07-20 MED ORDER — HYDROMORPHONE HCL PF 1 MG/ML IJ SOLN
1.0000 mg | Freq: Once | INTRAMUSCULAR | Status: AC
  Administered 2013-07-20: 1 mg via INTRAMUSCULAR
  Filled 2013-07-20: qty 1

## 2013-07-20 MED ORDER — CVS TRIPLE ANTIBIOTIC EX OINT: 1.0000 "application " | TOPICAL_OINTMENT | Freq: Two times a day (BID) | CUTANEOUS | Status: DC

## 2013-07-20 NOTE — ED Provider Notes (Signed)
CSN: [id number]     Arrival date & time 07/20/13  4098 History   First MD Initiated Contact with Patient 07/20/13 [phone]     Chief Complaint  Patient presents with  . Fall   (Consider location/radiation/quality/duration/timing/severity/associated sxs/prior Treatment) Patient is a 72 y.o. female presenting with fall. The history is provided by the patient.  Fall This is a new problem. The current episode started 12 to 24 hours ago. Episode frequency: once. The problem has been resolved. Pertinent negatives include no chest pain, no abdominal pain, no headaches and no shortness of breath. Exacerbated by: moving arms. The symptoms are relieved by narcotics. Treatments tried: narcoctics. The treatment provided mild relief.    Past Medical History  Diagnosis Date  . Diabetes mellitus   . Hypertension   . Coronary artery disease   . Colitis   . Neuropathy   . Chronic pain   . GERD (gastroesophageal reflux disease)   . Gastroparesis   . Fibrocystic breast   . Thyroid disease   . Arthritis    Past Surgical History  Procedure Laterality Date  . Abdominal hysterectomy    . Cardiac surgery    . Tonsillectomy    . Cardiac surgery    . Ankle surgery    . Wrist surgery    . Cateract surgery     No family history on file. History  Substance Use Topics  . Smoking status: Never Smoker   . Smokeless tobacco: Not on file  . Alcohol Use: No   OB History   Grav Para Term Preterm Abortions TAB SAB Ect Mult Living                 Review of Systems  Constitutional: Negative for fever and fatigue.  HENT: Negative for congestion, drooling and neck pain.   Eyes: Negative for pain.  Respiratory: Negative for cough and shortness of breath.   Cardiovascular: Negative for chest pain.  Gastrointestinal: Negative for nausea, vomiting, abdominal pain and diarrhea.  Genitourinary: Negative for dysuria and hematuria.  Musculoskeletal: Negative for back pain and gait problem.  Skin: Negative for  color change.  Neurological: Negative for dizziness and headaches.  Hematological: Negative for adenopathy.  Psychiatric/Behavioral: Negative for behavioral problems.  All other systems reviewed and are negative.    Allergies  Sulfa antibiotics  Home Medications   Current Outpatient Rx  Name  Route  Sig  Dispense  Refill  . Armodafinil (NUVIGIL) 250 MG tablet   Oral   Take 250 mg by mouth daily.         . Ascorbic Acid (VITAMIN C PO)   Oral   Take 1 tablet by mouth daily.         Marland Kitchen aspirin 81 MG tablet   Oral   Take 81 mg by mouth daily.         . Calcium Acetate, Phos Binder, (CALCIUM ACETATE PO)   Oral   Take 1 tablet by mouth daily.         . diclofenac (FLECTOR) 1.3 % PTCH   Transdermal   Place 1 patch onto the skin 2 (two) times daily. Patient uses this on her back.         . furosemide (LASIX) 20 MG tablet   Oral   Take 20 mg by mouth 2 (two) times daily.         Marland Kitchen gabapentin (NEURONTIN) 300 MG capsule   Oral   Take 300 mg by mouth 3 (three)  times daily.         . Insulin Human (INSULIN PUMP) 100 unit/ml SOLN   Subcutaneous   Inject 22.15 each into the skin 4 (four) times daily. Insulin pump:  @ midnight .09 units of insulin, @ 3:30 am 1.0 units of insulin, @ 9 am .80 units of insulin, @ 6 pm 1.05 units of insulin.         Marland Kitchen levothyroxine (SYNTHROID, LEVOTHROID) 112 MCG tablet   Oral   Take 112 mcg by mouth daily.         Marland Kitchen lidocaine (LIDODERM) 5 %   Transdermal   Place 1 patch onto the skin daily. Remove & Discard patch within 12 hours or as directed by MD         . lisinopril (PRINIVIL,ZESTRIL) 20 MG tablet   Oral   Take 20 mg by mouth daily.         . Mesalamine (ASACOL HD) 800 MG TBEC   Oral   Take 1 tablet by mouth daily.         . metoprolol succinate (TOPROL-XL) 25 MG 24 hr tablet   Oral   Take 25 mg by mouth daily.         . modafinil (PROVIGIL) 200 MG tablet   Oral   Take 200 mg by mouth daily.         .  Multiple Vitamins-Minerals (MULTIVITAMIN PO)   Oral   Take 1 tablet by mouth daily.         Marland Kitchen omeprazole (PRILOSEC) 40 MG capsule   Oral   Take 40 mg by mouth daily.         . Ondansetron HCl (ZOFRAN PO)   Oral   Take 1 tablet by mouth daily as needed. For nausea.         Marland Kitchen oxyCODONE-acetaminophen (PERCOCET) 5-325 MG per tablet   Oral   Take 1 tablet by mouth every 4 (four) hours as needed. Patient uses this medication for pain.         . Oxymorphone HCl, Crush Resist, (OPANA ER, CRUSH RESISTANT,) 40 MG TB12   Oral   Take 1 tablet by mouth daily.         . polyethylene glycol (MIRALAX / GLYCOLAX) packet   Oral   Take 17 g by mouth daily.         . rosuvastatin (CRESTOR) 5 MG tablet   Oral   Take 5 mg by mouth daily. Patient takes 2.5 mg of this medication every night, except for on Wednesday and Friday.  Then she takes the 5 mg tablet.         Marland Kitchen tiZANidine (ZANAFLEX) 4 MG tablet   Oral   Take 4 mg by mouth 3 (three) times daily.         . urea (CARMOL) 40 % CREA   Topical   Apply 1 application topically daily. Patient applies this medication to her feet.         . vitamin E 100 UNIT capsule   Oral   Take 100 Units by mouth daily.         Marland Kitchen warfarin (COUMADIN) 3 MG tablet   Oral   Take 3 mg by mouth daily. Anti-coagulant.  Patient needs a consult.  Patient states that she takes 1 and half tablets on everyday except Sunday.         . zinc gluconate 50 MG tablet   Oral   Take 50  mg by mouth daily.          BP 163/74  Pulse 67  Temp(Src) 98 F (36.7 C) (Oral)  Resp 18  SpO2 96% Physical Exam  Nursing note and vitals reviewed. Constitutional: She is oriented to person, place, and time. She appears well-developed and well-nourished.  HENT:  Head: Normocephalic.  Mouth/Throat: No oropharyngeal exudate.  Eyes: Conjunctivae and EOM are normal. Pupils are equal, round, and reactive to light.  Neck: Normal range of motion. Neck supple.   Cardiovascular: Normal rate, regular rhythm, normal heart sounds and intact distal pulses.  Exam reveals no gallop and no friction rub.   No murmur heard. Pulmonary/Chest: Effort normal and breath sounds normal. No respiratory distress. She has no wheezes.  Abdominal: Soft. Bowel sounds are normal. There is no tenderness. There is no rebound and no guarding.  Musculoskeletal: Normal range of motion. She exhibits no edema and no tenderness.  Tissue flap at base of left palm which has been re-approximated. No sutures in place. No erythema or evidence of infection.   Mild ttp of left wrist and mid left forearm.   Mild ttp of right proximal forearm and elbow.   2+ distal pulses. Sensation intact. No obvious motor deficits noted in bilateral UE's.   No ttp of hips/pelvis/LE's. Normal rom of hips bilaterally.   Neurological: She is alert and oriented to person, place, and time.  Skin: Skin is warm and dry.  Psychiatric: She has a normal mood and affect. Her behavior is normal.    ED Course  Procedures (including critical care time) Labs Review Labs Reviewed - No data to display Imaging Review Dg Elbow Complete Right  07/20/2013   CLINICAL DATA:  Fall, arm pain  EXAM: RIGHT ELBOW - COMPLETE 3+ VIEW  COMPARISON:  None.  FINDINGS: Mildly displaced radial head fracture.  No additional fracture is seen.  Displaced elbow joint fat pads, suggesting an elbow joint effusion.  IMPRESSION: Mildly displaced radial head fracture.   Electronically Signed   By: Charline Bills M.D.   On: 07/20/2013 09:55   Dg Forearm Left  07/20/2013   **ADDENDUM** CREATED: 07/20/2013 10:02:52  There is questioned lucency in the scaphoid.  Recommend left wrist film for further evaluation.  **END ADDENDUM** SIGNED BY: Sherian Rein, M.D.  07/20/2013   *RADIOLOGY REPORT*  Clinical Data: Status post fall with bilateral arm and hand pain.  LEFT FOREARM - 2 VIEW  Comparison: None.  Findings: There is no acute fracture or  dislocation in the left ulna or radius.  IMPRESSION: No acute fracture or dislocation.   Original Report Authenticated By: Sherian Rein, M.D.   Dg Forearm Right  07/20/2013   CLINICAL DATA:  Fall, arm/hand pain  EXAM: RIGHT FOREARM - 2 VIEW  COMPARISON:  None.  FINDINGS: Minimally displaced radial head fracture.  No additional fracture is seen.  Suspected displaced elbow joint fat pad, suggesting an elbow joint effusion.  IMPRESSION: Minimally displaced radial head fracture.   Electronically Signed   By: Charline Bills M.D.   On: 07/20/2013 09:55   Dg Wrist Complete Left  07/20/2013   *RADIOLOGY REPORT*  Clinical Data: Hand and wrist pain secondary to a fall yesterday. Palm laceration.  LEFT WRIST - COMPLETE 3+ VIEW  Comparison: None.  Findings: There appears to be a hairline fracture through the mid scaphoid.  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  Distal radius and ulna are normal.  IMPRESSION: Probable hairline fracture through the mid scaphoid.   Original Report Authenticated By: Francene Boyers, M.D.   Ct Head Wo Contrast  07/20/2013   CLINICAL DATA:  Fall  EXAM: CT HEAD WITHOUT CONTRAST  TECHNIQUE: Contiguous axial images were obtained from the base of the skull through the vertex without intravenous contrast.  COMPARISON:  None.  FINDINGS: No evidence of parenchymal hemorrhage or extra-axial fluid collection. No mass lesion, mass effect, or midline shift.  No CT evidence of acute infarction.  Subcortical white matter and periventricular small vessel ischemic changes. Intracranial atherosclerosis.  Cerebral volume is within normal limits.  No ventriculomegaly.  The visualized paranasal sinuses are essentially clear. The mastoid air cells are unopacified.  No evidence of calvarial fracture.  IMPRESSION: No evidence of acute intracranial abnormality.  Small vessel ischemic changes with intracranial  atherosclerosis.   Electronically Signed   By: Charline Bills M.D.   On: 07/20/2013 09:58   Dg Hand Complete Left  07/20/2013   *RADIOLOGY REPORT*  Clinical Data: Hand pain and laceration and swelling secondary to a fall yesterday.  LEFT HAND - COMPLETE 3+ VIEW  Comparison: None.  Findings: There is moderately severe arthritis of the interphalangeal joints of the hand as well as between the scaphoid and the trapezium and trapezoid.  There are cystic degenerative changes of the lunate and scaphoid.  No acute osseous abnormality.  IMPRESSION: No acute osseous abnormality.   Original Report Authenticated By: Francene Boyers, M.D.    MDM   1. Scaphoid fracture of wrist, left, closed, initial encounter   2. Right radial head fracture, closed, initial encounter   3. Fall, initial encounter    9:05 AM 72 y.o. female who presents with mechanical fall which occurred yesterday in her driveway. The patient suffered an avulsion of tissue on her right palm where her recent carpal tunnel surgery was. She was seen by her primary care doctor yesterday who started her on antibiotics, clean her wound, and set her up with a wound care followup. The patient presents today to 2 bony pain in her bilateral arms. She is afebrile here and her vital signs are otherwise unremarkable. She denies hitting her head or loss of consciousness yesterday. The patient has no headache. She is on Coumadin and will get a CT of her head. Will get screening imaging. The patient has been ambulatory without any pelvic or lower extremity complaints. She is no focal pelvic or large timing tenderness on exam. Do not think screening hip or pelvic films are necessary at this time. Will give the dilauded IM for pain.  Pt found to have minimally displaced right radial head fx. No limitation to supination/pronation in RUE. Will place in sling. Will place left hand scaphoid fx in thumb spica. Bacitracin for soft tissue wound on left palm.   I have  discussed the diagnosis/risks/treatment options with the patient and believe the pt to be eligible for discharge home to follow-up with ortho next week, call for appt. We also discussed returning to the ED immediately if new or worsening sx occur. We discussed the sx which are most concerning (e.g., worsening pain) that necessitate immediate return. Any new prescriptions provided to the patient are listed below.  Discharge Medication List as of 07/20/2013 11:00 AM    START taking these medications   Details  Neomycin-Bacitracin-Polymyxin ([facility] TRIPLE ANTIBIOTIC) OINT Apply 1 application topically 2 (two) times daily., Starting 07/20/2013, Until Discontinued, Print  Junius Argyle, MD 07/20/13 1730

## 2013-07-20 NOTE — ED Notes (Signed)
Patient here after fall yesterday am while walking down driveway, no loc. Complains of right elbow, left hand and wrist pain, recently had carpal tunnel surgery to left wrist

## 2013-07-20 NOTE — ED Notes (Signed)
Patients diamond ear rings were removed before cat scan and given to patients husband when patient was returned to her room after CT.

## 2013-07-20 NOTE — ED Notes (Signed)
MD at bedside. 

## 2013-10-05 ENCOUNTER — Emergency Department (HOSPITAL_BASED_OUTPATIENT_CLINIC_OR_DEPARTMENT_OTHER): Payer: Medicare Other

## 2013-10-05 ENCOUNTER — Emergency Department (HOSPITAL_BASED_OUTPATIENT_CLINIC_OR_DEPARTMENT_OTHER)
Admission: EM | Admit: 2013-10-05 | Discharge: 2013-10-05 | Disposition: A | Discharge status: Other Acute Inpatient Hospital | Payer: Medicare Other | Attending: Emergency Medicine | Admitting: Emergency Medicine

## 2013-10-05 ENCOUNTER — Encounter (HOSPITAL_BASED_OUTPATIENT_CLINIC_OR_DEPARTMENT_OTHER): Payer: Self-pay | Admitting: Emergency Medicine

## 2013-10-05 ENCOUNTER — Other Ambulatory Visit: Payer: Self-pay

## 2013-10-05 DIAGNOSIS — Z791 Long term (current) use of non-steroidal anti-inflammatories (NSAID): Secondary | ICD-10-CM | POA: Insufficient documentation

## 2013-10-05 DIAGNOSIS — E079 Disorder of thyroid, unspecified: Secondary | ICD-10-CM | POA: Insufficient documentation

## 2013-10-05 DIAGNOSIS — Z9861 Coronary angioplasty status: Secondary | ICD-10-CM | POA: Insufficient documentation

## 2013-10-05 DIAGNOSIS — Z79899 Other long term (current) drug therapy: Secondary | ICD-10-CM | POA: Insufficient documentation

## 2013-10-05 DIAGNOSIS — I2510 Atherosclerotic heart disease of native coronary artery without angina pectoris: Secondary | ICD-10-CM | POA: Insufficient documentation

## 2013-10-05 DIAGNOSIS — Z792 Long term (current) use of antibiotics: Secondary | ICD-10-CM | POA: Insufficient documentation

## 2013-10-05 DIAGNOSIS — M129 Arthropathy, unspecified: Secondary | ICD-10-CM | POA: Insufficient documentation

## 2013-10-05 DIAGNOSIS — Z794 Long term (current) use of insulin: Secondary | ICD-10-CM | POA: Insufficient documentation

## 2013-10-05 DIAGNOSIS — E119 Type 2 diabetes mellitus without complications: Secondary | ICD-10-CM | POA: Insufficient documentation

## 2013-10-05 DIAGNOSIS — Z7901 Long term (current) use of anticoagulants: Secondary | ICD-10-CM | POA: Insufficient documentation

## 2013-10-05 DIAGNOSIS — G8929 Other chronic pain: Secondary | ICD-10-CM | POA: Insufficient documentation

## 2013-10-05 DIAGNOSIS — I214 Non-ST elevation (NSTEMI) myocardial infarction: Secondary | ICD-10-CM

## 2013-10-05 DIAGNOSIS — Z7982 Long term (current) use of aspirin: Secondary | ICD-10-CM | POA: Insufficient documentation

## 2013-10-05 DIAGNOSIS — R739 Hyperglycemia, unspecified: Secondary | ICD-10-CM

## 2013-10-05 DIAGNOSIS — I10 Essential (primary) hypertension: Secondary | ICD-10-CM | POA: Insufficient documentation

## 2013-10-05 DIAGNOSIS — E860 Dehydration: Secondary | ICD-10-CM

## 2013-10-05 DIAGNOSIS — K219 Gastro-esophageal reflux disease without esophagitis: Secondary | ICD-10-CM | POA: Insufficient documentation

## 2013-10-05 DIAGNOSIS — Z8742 Personal history of other diseases of the female genital tract: Secondary | ICD-10-CM | POA: Insufficient documentation

## 2013-10-05 DIAGNOSIS — G609 Hereditary and idiopathic neuropathy, unspecified: Secondary | ICD-10-CM | POA: Insufficient documentation

## 2013-10-05 HISTORY — DX: Pure hypercholesterolemia, unspecified: E78.00

## 2013-10-05 HISTORY — DX: Personal history of other venous thrombosis and embolism: Z86.718

## 2013-10-05 LAB — URINALYSIS, ROUTINE W REFLEX MICROSCOPIC
Leukocytes, UA: NEGATIVE
Protein, ur: NEGATIVE mg/dL
Urobilinogen, UA: 0.2 mg/dL (ref 0.0–1.0)

## 2013-10-05 LAB — CBC WITH DIFFERENTIAL/PLATELET
Basophils Absolute: 0 10*3/uL (ref 0.0–0.1)
Eosinophils Relative: 0 % (ref 0–5)
HCT: 35.4 % — ABNORMAL LOW (ref 36.0–46.0)
Lymphocytes Relative: 6 % — ABNORMAL LOW (ref 12–46)
MCV: 91.9 fL (ref 78.0–100.0)
Monocytes Absolute: 0.8 10*3/uL (ref 0.1–1.0)
Platelets: 302 10*3/uL (ref 150–400)
RDW: 13 % (ref 11.5–15.5)
WBC: 15.8 10*3/uL — ABNORMAL HIGH (ref 4.0–10.5)

## 2013-10-05 LAB — POCT I-STAT 3, VENOUS BLOOD GAS (G3P V)
Acid-base deficit: 6 mmol/L — ABNORMAL HIGH (ref 0.0–2.0)
Patient temperature: 98
pCO2, Ven: 38.2 mmHg — ABNORMAL LOW (ref 45.0–50.0)

## 2013-10-05 LAB — GLUCOSE, CAPILLARY: Glucose-Capillary: 549 mg/dL — ABNORMAL HIGH (ref 70–99)

## 2013-10-05 LAB — COMPREHENSIVE METABOLIC PANEL
AST: 106 U/L — ABNORMAL HIGH (ref 0–37)
Alkaline Phosphatase: 107 U/L (ref 39–117)
BUN: 27 mg/dL — ABNORMAL HIGH (ref 6–23)
CO2: 19 mEq/L (ref 19–32)
Calcium: 9.7 mg/dL (ref 8.4–10.5)
Creatinine, Ser: 1.2 mg/dL — ABNORMAL HIGH (ref 0.50–1.10)
GFR calc Af Amer: 52 mL/min — ABNORMAL LOW (ref >90)
GFR calc non Af Amer: 45 mL/min — ABNORMAL LOW (ref >90)
Glucose, Bld: 399 mg/dL — ABNORMAL HIGH (ref 70–99)
Total Bilirubin: 0.3 mg/dL (ref 0.3–1.2)

## 2013-10-05 LAB — KETONES, QUALITATIVE

## 2013-10-05 LAB — URINE MICROSCOPIC-ADD ON

## 2013-10-05 LAB — TROPONIN I: Troponin I: >20 ng/mL (ref <0.30)

## 2013-10-05 MED ORDER — MORPHINE SULFATE 4 MG/ML IJ SOLN: 4.0000 mg | Freq: Once | INTRAMUSCULAR | Status: DC

## 2013-10-05 MED ORDER — HEPARIN (PORCINE) IN NACL 100-0.45 UNIT/ML-% IJ SOLN
12.0000 [IU]/kg/h | INTRAMUSCULAR | Status: DC
  Administered 2013-10-05: 8.507 [IU]/kg/h via INTRAVENOUS

## 2013-10-05 MED ORDER — INSULIN REGULAR HUMAN 100 UNIT/ML IJ SOLN
10.0000 [IU] | Freq: Once | INTRAMUSCULAR | Status: AC
  Administered 2013-10-05: 10 [IU] via SUBCUTANEOUS

## 2013-10-05 MED ORDER — HEPARIN BOLUS VIA INFUSION
4000.0000 [IU] | Freq: Once | INTRAVENOUS | Status: AC
  Administered 2013-10-05: 4000 [IU] via INTRAVENOUS

## 2013-10-05 MED ORDER — METOCLOPRAMIDE HCL 5 MG/ML IJ SOLN
10.0000 mg | Freq: Once | INTRAMUSCULAR | Status: AC
  Administered 2013-10-05: 10 mg via INTRAVENOUS

## 2013-10-05 MED ORDER — HEPARIN (PORCINE) IN NACL 100-0.45 UNIT/ML-% IJ SOLN
INTRAMUSCULAR | Status: AC
  Administered 2013-10-05: 8.507 [IU]/kg/h via INTRAVENOUS
  Filled 2013-10-05: qty 250

## 2013-10-05 MED ORDER — SODIUM CHLORIDE 0.9 % IV SOLN: 1000.0000 mL | INTRAVENOUS | Status: DC

## 2013-10-05 MED ORDER — NITROGLYCERIN 0.4 MG SL SUBL
0.4000 mg | SUBLINGUAL_TABLET | Freq: Once | SUBLINGUAL | Status: AC
  Administered 2013-10-05: 0.4 mg via SUBLINGUAL
  Filled 2013-10-05 (×2): qty 25

## 2013-10-05 MED ORDER — ASPIRIN 81 MG PO CHEW
324.0000 mg | CHEWABLE_TABLET | Freq: Once | ORAL | Status: AC
  Administered 2013-10-05: 324 mg via ORAL
  Filled 2013-10-05: qty 4

## 2013-10-05 MED ORDER — DEXTROSE-NACL 5-0.45 % IV SOLN: INTRAVENOUS | Status: DC

## 2013-10-05 MED ORDER — ONDANSETRON HCL 4 MG/2ML IJ SOLN
4.0000 mg | Freq: Once | INTRAMUSCULAR | Status: AC
  Administered 2013-10-05: 4 mg via INTRAVENOUS
  Filled 2013-10-05: qty 2

## 2013-10-05 MED ORDER — NITROGLYCERIN IN D5W 200-5 MCG/ML-% IV SOLN
10.0000 ug/min | INTRAVENOUS | Status: DC
  Administered 2013-10-05: 10 ug/min via INTRAVENOUS
  Filled 2013-10-05: qty 250

## 2013-10-05 MED ORDER — MORPHINE SULFATE 4 MG/ML IJ SOLN
4.0000 mg | Freq: Once | INTRAMUSCULAR | Status: AC
  Administered 2013-10-05: 4 mg via INTRAVENOUS

## 2013-10-05 MED ORDER — ASPIRIN 81 MG PO CHEW
CHEWABLE_TABLET | ORAL | Status: AC
  Administered 2013-10-05: 324 mg via ORAL
  Filled 2013-10-05: qty 3

## 2013-10-05 MED ORDER — SODIUM CHLORIDE 0.9 % IV SOLN: INTRAVENOUS | Status: DC

## 2013-10-05 MED ORDER — INSULIN REGULAR HUMAN 100 UNIT/ML IJ SOLN
INTRAMUSCULAR | Status: AC
  Filled 2013-10-05: qty 1

## 2013-10-05 MED ORDER — SODIUM CHLORIDE 0.9 % IV BOLUS (SEPSIS)
1000.0000 mL | Freq: Once | INTRAVENOUS | Status: AC
  Administered 2013-10-05: 1000 mL via INTRAVENOUS

## 2013-10-05 MED ORDER — INSULIN ASPART 100 UNIT/ML ~~LOC~~ SOLN: 10.0000 [IU] | Freq: Once | SUBCUTANEOUS | Status: DC

## 2013-10-05 MED ORDER — INSULIN REGULAR HUMAN 100 UNIT/ML IJ SOLN: 10.0000 [IU] | Freq: Once | INTRAMUSCULAR | Status: DC

## 2013-10-05 MED ORDER — MORPHINE SULFATE 4 MG/ML IJ SOLN
INTRAMUSCULAR | Status: AC
  Filled 2013-10-05: qty 1

## 2013-10-05 MED ORDER — METOCLOPRAMIDE HCL 5 MG/ML IJ SOLN
INTRAMUSCULAR | Status: AC
  Filled 2013-10-05: qty 2

## 2013-10-05 MED ORDER — MORPHINE SULFATE 4 MG/ML IJ SOLN
4.0000 mg | Freq: Once | INTRAMUSCULAR | Status: AC
  Administered 2013-10-05: 4 mg via INTRAVENOUS
  Filled 2013-10-05: qty 1

## 2013-10-05 NOTE — ED Notes (Signed)
Heparin to be dosed at 1,100 units/hour (104ml/hr) per Amalia Hailey @ main pharmacy

## 2013-10-05 NOTE — ED Provider Notes (Addendum)
CSN: [id number]     Arrival date & time 10/05/13  1005 History   First MD Initiated Contact with Patient 10/05/13 1029     Chief Complaint  Patient presents with  . Emesis   (Consider location/radiation/quality/duration/timing/severity/associated sxs/prior Treatment) HPI Comments: Patient presents with a two-day history of nausea vomiting. She feels achy all over. She's checked her ketones at home and it showed a large amount of urine ketones. Her blood sugars have been running high in the 3-400 range. With the vomiting she started having some pain under her left breast. She describes as an achy pain. It radiates to her back. It's not significantly worse with inspiration. It is worse with movement and cough. She denies any significant pedal edema. She denies any known fevers. She does have a history of DKA in the past. She's on insulin pump.  Patient is a 72 y.o. female presenting with vomiting.  Emesis Associated symptoms: no abdominal pain, no arthralgias, no chills, no diarrhea and no headaches     Past Medical History  Diagnosis Date  . Diabetes mellitus   . Hypertension   . Coronary artery disease   . Colitis   . Neuropathy   . Chronic pain   . GERD (gastroesophageal reflux disease)   . Gastroparesis   . Fibrocystic breast   . Thyroid disease   . Arthritis    Past Surgical History  Procedure Laterality Date  . Abdominal hysterectomy    . Cardiac surgery    . Tonsillectomy    . Cardiac surgery    . Ankle surgery    . Wrist surgery    . Cateract surgery     No family history on file. History  Substance Use Topics  . Smoking status: Never Smoker   . Smokeless tobacco: Not on file  . Alcohol Use: No   OB History   Grav Para Term Preterm Abortions TAB SAB Ect Mult Living                 Review of Systems  Constitutional: Positive for fatigue. Negative for fever, chills and diaphoresis.  HENT: Negative for congestion, rhinorrhea and sneezing.   Eyes: Negative.    Respiratory: Positive for shortness of breath. Negative for cough and chest tightness.   Cardiovascular: Positive for chest pain. Negative for leg swelling.  Gastrointestinal: Positive for nausea and vomiting. Negative for abdominal pain, diarrhea and blood in stool.  Genitourinary: Negative for frequency, hematuria, flank pain and difficulty urinating.  Musculoskeletal: Negative for arthralgias and back pain.  Skin: Negative for rash.  Neurological: Negative for dizziness, speech difficulty, weakness, numbness and headaches.    Allergies  Sulfa antibiotics  Home Medications   Current Outpatient Rx  Name  Route  Sig  Dispense  Refill  . Armodafinil (NUVIGIL) 250 MG tablet   Oral   Take 250 mg by mouth daily.         . Ascorbic Acid (VITAMIN C PO)   Oral   Take 1 tablet by mouth daily.         Marland Kitchen aspirin 81 MG tablet   Oral   Take 81 mg by mouth daily.         . Calcium Acetate, Phos Binder, (CALCIUM ACETATE PO)   Oral   Take 1 tablet by mouth daily.         . diclofenac (FLECTOR) 1.3 % PTCH   Transdermal   Place 1 patch onto the skin 2 (two) times daily. Patient  uses this on her back.         . furosemide (LASIX) 20 MG tablet   Oral   Take 20 mg by mouth 2 (two) times daily.         Marland Kitchen gabapentin (NEURONTIN) 300 MG capsule   Oral   Take 300 mg by mouth 3 (three) times daily.         . Insulin Human (INSULIN PUMP) 100 unit/ml SOLN   Subcutaneous   Inject 22.15 each into the skin 4 (four) times daily. Insulin pump:  @ midnight .09 units of insulin, @ 3:30 am 1.0 units of insulin, @ 9 am .80 units of insulin, @ 6 pm 1.05 units of insulin.         Marland Kitchen levothyroxine (SYNTHROID, LEVOTHROID) 112 MCG tablet   Oral   Take 112 mcg by mouth daily.         Marland Kitchen lidocaine (LIDODERM) 5 %   Transdermal   Place 1 patch onto the skin daily. Remove & Discard patch within 12 hours or as directed by MD         . lisinopril (PRINIVIL,ZESTRIL) 20 MG tablet   Oral    Take 20 mg by mouth daily.         . Mesalamine (ASACOL HD) 800 MG TBEC   Oral   Take 1 tablet by mouth daily.         . metoprolol succinate (TOPROL-XL) 25 MG 24 hr tablet   Oral   Take 25 mg by mouth daily.         . modafinil (PROVIGIL) 200 MG tablet   Oral   Take 200 mg by mouth daily.         . Multiple Vitamins-Minerals (MULTIVITAMIN PO)   Oral   Take 1 tablet by mouth daily.         Marland Kitchen Neomycin-Bacitracin-Polymyxin ([employer] TRIPLE ANTIBIOTIC) OINT   Apply externally   Apply 1 application topically 2 (two) times daily.   60 g   0   . omeprazole (PRILOSEC) 40 MG capsule   Oral   Take 40 mg by mouth daily.         . Ondansetron HCl (ZOFRAN PO)   Oral   Take 1 tablet by mouth daily as needed. For nausea.         Marland Kitchen oxyCODONE-acetaminophen (PERCOCET) 5-325 MG per tablet   Oral   Take 1 tablet by mouth every 4 (four) hours as needed. Patient uses this medication for pain.         . Oxymorphone HCl, Crush Resist, (OPANA ER, CRUSH RESISTANT,) 40 MG TB12   Oral   Take 1 tablet by mouth daily.         . polyethylene glycol (MIRALAX / GLYCOLAX) packet   Oral   Take 17 g by mouth daily.         . rosuvastatin (CRESTOR) 5 MG tablet   Oral   Take 5 mg by mouth daily. Patient takes 2.5 mg of this medication every night, except for on Wednesday and Friday.  Then she takes the 5 mg tablet.         Marland Kitchen tiZANidine (ZANAFLEX) 4 MG tablet   Oral   Take 4 mg by mouth 3 (three) times daily.         . urea (CARMOL) 40 % CREA   Topical   Apply 1 application topically daily. Patient applies this medication to her feet.         Marland Kitchen  vitamin E 100 UNIT capsule   Oral   Take 100 Units by mouth daily.         Marland Kitchen warfarin (COUMADIN) 3 MG tablet   Oral   Take 3 mg by mouth daily. Anti-coagulant.  Patient needs a consult.  Patient states that she takes 1 and half tablets on everyday except Sunday.         . zinc gluconate 50 MG tablet   Oral   Take 50 mg by  mouth daily.          BP 166/59  Pulse 80  Temp(Src) 98 F (36.7 C)  Resp 18  SpO2 97% Physical Exam  Constitutional: She is oriented to person, place, and time. She appears well-developed and well-nourished.  Smells of ketones  HENT:  Head: Normocephalic and atraumatic.  Eyes: Pupils are equal, round, and reactive to light.  Neck: Normal range of motion. Neck supple.  Cardiovascular: Normal rate, regular rhythm and normal heart sounds.   Pulmonary/Chest: Effort normal and breath sounds normal. No respiratory distress. She has no wheezes. She has no rales. She exhibits tenderness (Some reproducible tenderness to the left chest wall).  Abdominal: Soft. Bowel sounds are normal. There is no tenderness. There is no rebound and no guarding.  Musculoskeletal: Normal range of motion. She exhibits no edema.  Lymphadenopathy:    She has no cervical adenopathy.  Neurological: She is alert and oriented to person, place, and time.  Skin: Skin is warm and dry. No rash noted.  Psychiatric: She has a normal mood and affect.    ED Course  Procedures (including critical care time) Labs Review Labs Reviewed  CBC WITH DIFFERENTIAL - Abnormal; Notable for the following:    WBC 15.8 (*)    RBC 3.85 (*)    Hemoglobin 11.5 (*)    HCT 35.4 (*)    Neutrophils Relative % 89 (*)    Neutro Abs 14.1 (*)    Lymphocytes Relative 6 (*)    All other components within normal limits  COMPREHENSIVE METABOLIC PANEL - Abnormal; Notable for the following:    Chloride 95 (*)    Glucose, Bld 399 (*)    BUN 27 (*)    Creatinine, Ser 1.20 (*)    AST 106 (*)    GFR calc non Af Amer 45 (*)    GFR calc Af Amer 52 (*)    All other components within normal limits  TROPONIN I - Abnormal; Notable for the following:    Troponin I >20.00 (*)    All other components within normal limits  URINALYSIS, ROUTINE W REFLEX MICROSCOPIC - Abnormal; Notable for the following:    Glucose, UA >1000 (*)    Bilirubin Urine  SMALL (*)    Ketones, ur >80 (*)    All other components within normal limits  KETONES, QUALITATIVE - Abnormal; Notable for the following:    Acetone, Bld MODERATE (*)    All other components within normal limits  URINE MICROSCOPIC-ADD ON - Abnormal; Notable for the following:    Bacteria, UA FEW (*)    All other components within normal limits  GLUCOSE, CAPILLARY - Abnormal; Notable for the following:    Glucose-Capillary 531 (*)    All other components within normal limits  POCT I-STAT 3, BLOOD GAS (G3P V) - Abnormal; Notable for the following:    pH, Ven 7.315 (*)    pCO2, Ven 38.2 (*)    pO2, Ven 28.0 (*)  Bicarbonate 19.5 (*)    Acid-base deficit 6.0 (*)    All other components within normal limits  BLOOD GAS, VENOUS   Imaging Review Dg Chest 2 View  10/05/2013   CLINICAL DATA:  Nausea and vomiting.  EXAM: CHEST  2 VIEW  COMPARISON:  01/12/2012  FINDINGS: There is mild cardiac enlargement. No pleural effusion or edema identified. Both lungs are clear. The visualized skeletal structures are unremarkable.  IMPRESSION: 1. No acute cardiopulmonary abnormalities.   Electronically Signed   By: Signa Kell M.D.   On: 10/05/2013 11:29    EKG Interpretation    Date/Time:  Saturday October 05 2013 14:15:44 EST Ventricular Rate:  83 PR Interval:  160 QRS Duration: 84 QT Interval:  392 QTC Calculation: 460 R Axis:   -30 Text Interpretation:  Normal sinus rhythm Left axis deviation Abnormal ECG Confirmed by Rilda Bulls  MD, Suleika Donavan ([id number]) on 10/05/2013 2:34:02 PM            Date: 10/05/2013  Rate: 83  Rhythm: normal sinus rhythm  QRS Axis: left  Intervals: normal  ST/T Wave abnormalities: nonspecific ST/T changes  Conduction Disutrbances:none  Narrative Interpretation:   Old EKG Reviewed: unchanged    MDM   1. NSTEMI (non-ST elevated myocardial infarction)   2. Hyperglycemia   3. Dehydration    Patient with a history of diabetes and coronary artery disease status  post stent placement 10 years ago presents with left-sided chest pain associated with vomiting and hyperglycemia. Her pain has been going on continuously suggested a morning. The pain is atypical sharp in nature and somewhat reproducible on exam. However her troponin came back at greater than 20. She has no ischemic changes noted on EKG. She was started on heparin and as well as a nitroglycerin drip. She was started on glucose stabilizer. She was given aspirin. Her pain is starting to improve with the nitroglycerin. Given her markedly elevated troponin I did contact Dr. Dot Been at [facility]. Patient prefers to go to [facility] his all her doctors are there. Dr. Dot Been says that he agrees with the current plan and will see the patient when she is transferred to the hospital. He prefers the patient be admitted to [facility].  I spoke with Karena Addison, the [facility] who accepted the patient for transfer to a step-down bed for Dr. Clydie Braun  14:55: Dr Clydie Braun called back and does not want to admit pt.  Dr. Dot Been contacted and agrees to accept  CRITICAL CARE Performed by: Amylynn Fano Total critical care time: 60 Critical care time was exclusive of separately billable procedures and treating other patients. Critical care was necessary to treat or prevent imminent or life-threatening deterioration. Critical care was time spent personally by me on the following activities: development of treatment plan with patient and/or surrogate as well as nursing, discussions with consultants, evaluation of patient's response to treatment, examination of patient, obtaining history from patient or surrogate, ordering and performing treatments and interventions, ordering and review of laboratory studies, ordering and review of radiographic studies, pulse oximetry and re-evaluation of patient's condition.     Rolan Bucco, MD 10/05/13 1447  Rolan Bucco, MD 10/05/13 1455

## 2013-10-05 NOTE — ED Notes (Addendum)
Patient here with ongoing nausea and vomiting since last pm. Reports that she is aching all over and has vomited x 5, denies diarrhea. Concerned that her urine dipstick showed a lot of ketones, denies abdominal pain, pain under left breast

## 2013-10-05 NOTE — ED Notes (Signed)
Beeped Cardiologist back on patient for Dr. Florene Glen call back to [phone]

## 2013-10-05 NOTE — ED Notes (Signed)
Beeped [facility] for admission--sill call back to [phone]

## 2013-10-05 NOTE — ED Notes (Signed)
[place] is transporting patient to [facility] is riding to transport

## 2013-10-05 NOTE — ED Notes (Signed)
Beeped [facility]--to call back to [phone]

## 2014-01-20 ENCOUNTER — Emergency Department (HOSPITAL_BASED_OUTPATIENT_CLINIC_OR_DEPARTMENT_OTHER)
Admission: EM | Admit: 2014-01-20 | Discharge: 2014-01-20 | Disposition: A | Discharge status: Nursing Home (Includes ICF) | Payer: Medicare Other | Attending: Emergency Medicine | Admitting: Emergency Medicine

## 2014-01-20 ENCOUNTER — Encounter (HOSPITAL_BASED_OUTPATIENT_CLINIC_OR_DEPARTMENT_OTHER): Payer: Self-pay | Admitting: Emergency Medicine

## 2014-01-20 ENCOUNTER — Emergency Department (HOSPITAL_BASED_OUTPATIENT_CLINIC_OR_DEPARTMENT_OTHER): Payer: Medicare Other

## 2014-01-20 DIAGNOSIS — Z791 Long term (current) use of non-steroidal anti-inflammatories (NSAID): Secondary | ICD-10-CM | POA: Insufficient documentation

## 2014-01-20 DIAGNOSIS — I10 Essential (primary) hypertension: Secondary | ICD-10-CM | POA: Insufficient documentation

## 2014-01-20 DIAGNOSIS — K219 Gastro-esophageal reflux disease without esophagitis: Secondary | ICD-10-CM | POA: Insufficient documentation

## 2014-01-20 DIAGNOSIS — Z794 Long term (current) use of insulin: Secondary | ICD-10-CM | POA: Insufficient documentation

## 2014-01-20 DIAGNOSIS — Z7901 Long term (current) use of anticoagulants: Secondary | ICD-10-CM | POA: Insufficient documentation

## 2014-01-20 DIAGNOSIS — Z9889 Other specified postprocedural states: Secondary | ICD-10-CM | POA: Insufficient documentation

## 2014-01-20 DIAGNOSIS — I509 Heart failure, unspecified: Secondary | ICD-10-CM | POA: Insufficient documentation

## 2014-01-20 DIAGNOSIS — G8929 Other chronic pain: Secondary | ICD-10-CM | POA: Insufficient documentation

## 2014-01-20 DIAGNOSIS — I2510 Atherosclerotic heart disease of native coronary artery without angina pectoris: Secondary | ICD-10-CM | POA: Insufficient documentation

## 2014-01-20 DIAGNOSIS — E079 Disorder of thyroid, unspecified: Secondary | ICD-10-CM | POA: Insufficient documentation

## 2014-01-20 DIAGNOSIS — E7800 Pure hypercholesterolemia, unspecified: Secondary | ICD-10-CM | POA: Insufficient documentation

## 2014-01-20 DIAGNOSIS — R0902 Hypoxemia: Secondary | ICD-10-CM

## 2014-01-20 DIAGNOSIS — I252 Old myocardial infarction: Secondary | ICD-10-CM | POA: Insufficient documentation

## 2014-01-20 DIAGNOSIS — Z8669 Personal history of other diseases of the nervous system and sense organs: Secondary | ICD-10-CM | POA: Insufficient documentation

## 2014-01-20 DIAGNOSIS — M129 Arthropathy, unspecified: Secondary | ICD-10-CM | POA: Insufficient documentation

## 2014-01-20 DIAGNOSIS — Z86711 Personal history of pulmonary embolism: Secondary | ICD-10-CM | POA: Insufficient documentation

## 2014-01-20 DIAGNOSIS — Z7982 Long term (current) use of aspirin: Secondary | ICD-10-CM | POA: Insufficient documentation

## 2014-01-20 DIAGNOSIS — E119 Type 2 diabetes mellitus without complications: Secondary | ICD-10-CM | POA: Insufficient documentation

## 2014-01-20 DIAGNOSIS — Z79899 Other long term (current) drug therapy: Secondary | ICD-10-CM | POA: Insufficient documentation

## 2014-01-20 DIAGNOSIS — Z86718 Personal history of other venous thrombosis and embolism: Secondary | ICD-10-CM | POA: Insufficient documentation

## 2014-01-20 DIAGNOSIS — Z8719 Personal history of other diseases of the digestive system: Secondary | ICD-10-CM | POA: Insufficient documentation

## 2014-01-20 DIAGNOSIS — Z8742 Personal history of other diseases of the female genital tract: Secondary | ICD-10-CM | POA: Insufficient documentation

## 2014-01-20 HISTORY — DX: Acute myocardial infarction, unspecified: I21.9

## 2014-01-20 HISTORY — DX: Acute embolism and thrombosis of unspecified deep veins of unspecified lower extremity: I82.409

## 2014-01-20 HISTORY — DX: Other pulmonary embolism without acute cor pulmonale: I26.99

## 2014-01-20 LAB — CBC WITH DIFFERENTIAL/PLATELET
Basophils Absolute: 0.1 10*3/uL (ref 0.0–0.1)
Basophils Relative: 1 % (ref 0–1)
Eosinophils Absolute: 0.2 10*3/uL (ref 0.0–0.7)
Eosinophils Relative: 2 % (ref 0–5)
HEMATOCRIT: 33.6 % — AB (ref 36.0–46.0)
HEMOGLOBIN: 10.9 g/dL — AB (ref 12.0–15.0)
LYMPHS ABS: 1.2 10*3/uL (ref 0.7–4.0)
Lymphocytes Relative: 13 % (ref 12–46)
MCH: 29.8 pg (ref 26.0–34.0)
MCHC: 32.4 g/dL (ref 30.0–36.0)
MCV: 91.8 fL (ref 78.0–100.0)
MONO ABS: 0.7 10*3/uL (ref 0.1–1.0)
MONOS PCT: 8 % (ref 3–12)
NEUTROS ABS: 6.7 10*3/uL (ref 1.7–7.7)
Neutrophils Relative %: 76 % (ref 43–77)
Platelets: 278 10*3/uL (ref 150–400)
RBC: 3.66 MIL/uL — AB (ref 3.87–5.11)
RDW: 13.1 % (ref 11.5–15.5)
WBC: 8.8 10*3/uL (ref 4.0–10.5)

## 2014-01-20 LAB — PROTIME-INR
INR: 1.16 (ref 0.00–1.49)
PROTHROMBIN TIME: 14.6 s (ref 11.6–15.2)

## 2014-01-20 LAB — BASIC METABOLIC PANEL
BUN: 14 mg/dL (ref 6–23)
CHLORIDE: 100 meq/L (ref 96–112)
CO2: 30 mEq/L (ref 19–32)
Calcium: 9.5 mg/dL (ref 8.4–10.5)
Creatinine, Ser: 1 mg/dL (ref 0.50–1.10)
GFR, EST AFRICAN AMERICAN: 65 mL/min — AB (ref >90)
GFR, EST NON AFRICAN AMERICAN: 56 mL/min — AB (ref >90)
Glucose, Bld: 253 mg/dL — ABNORMAL HIGH (ref 70–99)
POTASSIUM: 4.6 meq/L (ref 3.7–5.3)
SODIUM: 140 meq/L (ref 137–147)

## 2014-01-20 LAB — CBG MONITORING, ED: Glucose-Capillary: 197 mg/dL — ABNORMAL HIGH (ref 70–99)

## 2014-01-20 LAB — TROPONIN I: Troponin I: <0.3 ng/mL (ref <0.30)

## 2014-01-20 LAB — PRO B NATRIURETIC PEPTIDE: Pro B Natriuretic peptide (BNP): 8565 pg/mL — ABNORMAL HIGH (ref 0–125)

## 2014-01-20 MED ORDER — IPRATROPIUM-ALBUTEROL 0.5-2.5 (3) MG/3ML IN SOLN
3.0000 mL | Freq: Once | RESPIRATORY_TRACT | Status: AC
  Administered 2014-01-20: 3 mL via RESPIRATORY_TRACT
  Filled 2014-01-20: qty 3

## 2014-01-20 MED ORDER — IPRATROPIUM BROMIDE 0.02 % IN SOLN
0.5000 mg | Freq: Once | RESPIRATORY_TRACT | Status: DC
  Filled 2014-01-20: qty 2.5

## 2014-01-20 MED ORDER — FUROSEMIDE 10 MG/ML IJ SOLN
40.0000 mg | Freq: Once | INTRAMUSCULAR | Status: AC
  Administered 2014-01-20: 40 mg via INTRAVENOUS
  Filled 2014-01-20: qty 4

## 2014-01-20 MED ORDER — ALBUTEROL SULFATE (2.5 MG/3ML) 0.083% IN NEBU
2.5000 mg | INHALATION_SOLUTION | Freq: Once | RESPIRATORY_TRACT | Status: DC
  Filled 2014-01-20: qty 3

## 2014-01-20 NOTE — ED Notes (Signed)
Patient transported to X-ray via stretcher per tech. 

## 2014-01-20 NOTE — ED Notes (Signed)
MD at bedside. 

## 2014-01-20 NOTE — ED Notes (Signed)
Patient had to use restroom urgently, EKG will be done upon return to room.

## 2014-01-20 NOTE — ED Notes (Signed)
Pt placed on cardiac, bp and pulse ox monitoring 

## 2014-01-20 NOTE — ED Notes (Addendum)
Pt c/o shortness of breath since waking up this morning. Pt is on O2 2L [place] at home. 94% in triage with 2L [place]. Pt tachypneic in triage and sts she felt like this the last time she had a heart attack. Pt reports discomfort in chest earlier today but not at present.

## 2014-01-20 NOTE — ED Notes (Signed)
Patient ambulated in hall with O2 at 2l/m.  HR 79-90, RR 18-24, spO2 87% at lowest point, DOE noted.

## 2014-01-20 NOTE — ED Provider Notes (Signed)
CSN: [id number]     Arrival date & time 01/20/14  1041 History   First MD Initiated Contact with Patient 01/20/14 1054     Chief Complaint  Patient presents with  . Shortness of Breath     (Consider location/radiation/quality/duration/timing/severity/associated sxs/prior Treatment) HPI Comments: Patient is a 73 year old female with history of diabetes, coronary artery disease, pulmonary embolism. She is on home oxygen. She presents today with complaints of difficulty breathing that started this morning at approximately 6 AM. She denies any fever or productive cough. She felt tight in her chest this morning but denies pain.  Patient is a 73 y.o. female presenting with shortness of breath. The history is provided by the patient.  Shortness of Breath Severity:  Moderate Onset quality:  Sudden Duration: 4. Timing:  Constant Progression:  Worsening Chronicity:  New Context: activity   Relieved by:  Nothing Worsened by:  Nothing tried Ineffective treatments:  None tried   Past Medical History  Diagnosis Date  . Diabetes mellitus   . Hypertension   . Coronary artery disease   . Colitis   . Neuropathy   . Chronic pain   . GERD (gastroesophageal reflux disease)   . Gastroparesis   . Fibrocystic breast   . Thyroid disease   . Arthritis   . History of blood clots   . High cholesterol   . MI (myocardial infarction)   . DVT (deep venous thrombosis)   . Pulmonary embolism    Past Surgical History  Procedure Laterality Date  . Abdominal hysterectomy    . Cardiac surgery    . Tonsillectomy    . Cardiac surgery    . Ankle surgery    . Wrist surgery    . Cateract surgery     No family history on file. History  Substance Use Topics  . Smoking status: Never Smoker   . Smokeless tobacco: Not on file  . Alcohol Use: No   OB History   Grav Para Term Preterm Abortions TAB SAB Ect Mult Living                 Review of Systems  Respiratory: Positive for shortness of breath.    All other systems reviewed and are negative.      Allergies  Sulfa antibiotics  Home Medications   Current Outpatient Rx  Name  Route  Sig  Dispense  Refill  . Armodafinil (NUVIGIL) 250 MG tablet   Oral   Take 250 mg by mouth daily.         . Ascorbic Acid (VITAMIN C PO)   Oral   Take 1 tablet by mouth daily.         Marland Kitchen aspirin 81 MG tablet   Oral   Take 81 mg by mouth daily.         . Calcium Acetate, Phos Binder, (CALCIUM ACETATE PO)   Oral   Take 1 tablet by mouth daily.         . diclofenac (FLECTOR) 1.3 % PTCH   Transdermal   Place 1 patch onto the skin 2 (two) times daily. Patient uses this on her back.         . furosemide (LASIX) 20 MG tablet   Oral   Take 20 mg by mouth 2 (two) times daily.         Marland Kitchen gabapentin (NEURONTIN) 300 MG capsule   Oral   Take 300 mg by mouth 3 (three) times daily.         Marland Kitchen  Insulin Human (INSULIN PUMP) 100 unit/ml SOLN   Subcutaneous   Inject 22.15 each into the skin 4 (four) times daily. Insulin pump:  @ midnight .09 units of insulin, @ 3:30 am 1.0 units of insulin, @ 9 am .80 units of insulin, @ 6 pm 1.05 units of insulin.         Marland Kitchen levothyroxine (SYNTHROID, LEVOTHROID) 112 MCG tablet   Oral   Take 112 mcg by mouth daily.         Marland Kitchen lidocaine (LIDODERM) 5 %   Transdermal   Place 1 patch onto the skin daily. Remove & Discard patch within 12 hours or as directed by MD         . lisinopril (PRINIVIL,ZESTRIL) 20 MG tablet   Oral   Take 20 mg by mouth daily.         . Mesalamine (ASACOL HD) 800 MG TBEC   Oral   Take 1 tablet by mouth daily.         . metoprolol succinate (TOPROL-XL) 25 MG 24 hr tablet   Oral   Take 25 mg by mouth daily.         . modafinil (PROVIGIL) 200 MG tablet   Oral   Take 200 mg by mouth daily.         . Multiple Vitamins-Minerals (MULTIVITAMIN PO)   Oral   Take 1 tablet by mouth daily.         Marland Kitchen Neomycin-Bacitracin-Polymyxin ([employer] TRIPLE ANTIBIOTIC) OINT    Apply externally   Apply 1 application topically 2 (two) times daily.   60 g   0   . omeprazole (PRILOSEC) 40 MG capsule   Oral   Take 40 mg by mouth daily.         . Ondansetron HCl (ZOFRAN PO)   Oral   Take 1 tablet by mouth daily as needed. For nausea.         Marland Kitchen oxyCODONE-acetaminophen (PERCOCET) 5-325 MG per tablet   Oral   Take 1 tablet by mouth every 4 (four) hours as needed. Patient uses this medication for pain.         . Oxymorphone HCl, Crush Resist, (OPANA ER, CRUSH RESISTANT,) 40 MG TB12   Oral   Take 1 tablet by mouth daily.         . polyethylene glycol (MIRALAX / GLYCOLAX) packet   Oral   Take 17 g by mouth daily.         . rosuvastatin (CRESTOR) 5 MG tablet   Oral   Take 5 mg by mouth daily. Patient takes 2.5 mg of this medication every night, except for on Wednesday and Friday.  Then she takes the 5 mg tablet.         Marland Kitchen tiZANidine (ZANAFLEX) 4 MG tablet   Oral   Take 4 mg by mouth 3 (three) times daily.         . urea (CARMOL) 40 % CREA   Topical   Apply 1 application topically daily. Patient applies this medication to her feet.         . vitamin E 100 UNIT capsule   Oral   Take 100 Units by mouth daily.         Marland Kitchen warfarin (COUMADIN) 3 MG tablet   Oral   Take 3 mg by mouth daily. Anti-coagulant.  Patient needs a consult.  Patient states that she takes 1 and half tablets on everyday except Sunday.         Marland Kitchen  zinc gluconate 50 MG tablet   Oral   Take 50 mg by mouth daily.          BP 118/62  Pulse 66  Temp(Src) 98.6 F (37 C) (Oral)  Resp 22  SpO2 94% Physical Exam  Nursing note and vitals reviewed. Constitutional: She is oriented to person, place, and time. She appears well-developed and well-nourished. No distress.  HENT:  Head: Normocephalic and atraumatic.  Neck: Normal range of motion. Neck supple.  Cardiovascular: Normal rate and regular rhythm.  Exam reveals no gallop and no friction rub.   No murmur  heard. Pulmonary/Chest: Effort normal. No respiratory distress. She has no wheezes. She has rales.  There are slight rales in the bases bilaterally.  Abdominal: Soft. Bowel sounds are normal. She exhibits no distension. There is no tenderness.  Musculoskeletal: Normal range of motion. She exhibits edema.  There is 2+ bilateral edema present.  Neurological: She is alert and oriented to person, place, and time.  Skin: Skin is warm and dry. She is not diaphoretic.    ED Course  Procedures (including critical care time) Labs Review Labs Reviewed  CBC WITH DIFFERENTIAL - Abnormal; Notable for the following:    RBC 3.66 (*)    Hemoglobin 10.9 (*)    HCT 33.6 (*)    All other components within normal limits  BASIC METABOLIC PANEL  TROPONIN I  PRO B NATRIURETIC PEPTIDE  PROTIME-INR   Imaging Review No results found.   EKG Interpretation   Date/Time:  Monday January 20 2014 11:00:40 EDT Ventricular Rate:  69 PR Interval:  168 QRS Duration: 82 QT Interval:  424 QTC Calculation: 454 R Axis:   59 Text Interpretation:  Normal sinus rhythm Nonspecific T wave abnormality  Abnormal ECG Confirmed by DELOS  MD, Anthonee Gelin ([id number]) on 01/20/2014 11:35:41  AM      MDM   Final diagnoses:  None    Patient is a 73 year old female with history of CHF, CAD, pulmonary embolism. She presents with complaints of dyspnea that appears to be heart failure related. Her BNP is 8500 and chest x-ray shows pulmonary vasculature 4 during on pulmonary edema. She was given IV Lasix which produced a good diuresis. However with ambulation she desaturates in the low 80s and becomes very dyspneic. I feel as though she requires hospitalization for diuresis and further evaluation. I've spoken with Helayne Seminole, the nurse practitioner with [facility]. The patient will be accepted under the care of Dr. Jonelle Sidle for admission.    Veryl Speak, MD 01/20/14 [phone]

## 2014-02-11 ENCOUNTER — Emergency Department (HOSPITAL_BASED_OUTPATIENT_CLINIC_OR_DEPARTMENT_OTHER): Payer: Medicare Other

## 2014-02-11 ENCOUNTER — Emergency Department (HOSPITAL_BASED_OUTPATIENT_CLINIC_OR_DEPARTMENT_OTHER)
Admission: EM | Admit: 2014-02-11 | Discharge: 2014-02-11 | Disposition: A | Discharge status: Other Acute Inpatient Hospital | Payer: Medicare Other | Attending: Emergency Medicine | Admitting: Emergency Medicine

## 2014-02-11 ENCOUNTER — Encounter (HOSPITAL_BASED_OUTPATIENT_CLINIC_OR_DEPARTMENT_OTHER): Payer: Self-pay | Admitting: Emergency Medicine

## 2014-02-11 DIAGNOSIS — Z79899 Other long term (current) drug therapy: Secondary | ICD-10-CM | POA: Insufficient documentation

## 2014-02-11 DIAGNOSIS — Z86711 Personal history of pulmonary embolism: Secondary | ICD-10-CM | POA: Insufficient documentation

## 2014-02-11 DIAGNOSIS — R05 Cough: Secondary | ICD-10-CM | POA: Insufficient documentation

## 2014-02-11 DIAGNOSIS — E079 Disorder of thyroid, unspecified: Secondary | ICD-10-CM | POA: Insufficient documentation

## 2014-02-11 DIAGNOSIS — G8929 Other chronic pain: Secondary | ICD-10-CM | POA: Insufficient documentation

## 2014-02-11 DIAGNOSIS — Z7982 Long term (current) use of aspirin: Secondary | ICD-10-CM | POA: Insufficient documentation

## 2014-02-11 DIAGNOSIS — Z86718 Personal history of other venous thrombosis and embolism: Secondary | ICD-10-CM | POA: Insufficient documentation

## 2014-02-11 DIAGNOSIS — E119 Type 2 diabetes mellitus without complications: Secondary | ICD-10-CM | POA: Insufficient documentation

## 2014-02-11 DIAGNOSIS — I10 Essential (primary) hypertension: Secondary | ICD-10-CM | POA: Insufficient documentation

## 2014-02-11 DIAGNOSIS — R5383 Other fatigue: Secondary | ICD-10-CM

## 2014-02-11 DIAGNOSIS — R112 Nausea with vomiting, unspecified: Secondary | ICD-10-CM | POA: Insufficient documentation

## 2014-02-11 DIAGNOSIS — I509 Heart failure, unspecified: Secondary | ICD-10-CM | POA: Insufficient documentation

## 2014-02-11 DIAGNOSIS — R609 Edema, unspecified: Secondary | ICD-10-CM | POA: Insufficient documentation

## 2014-02-11 DIAGNOSIS — Z9889 Other specified postprocedural states: Secondary | ICD-10-CM | POA: Insufficient documentation

## 2014-02-11 DIAGNOSIS — E7800 Pure hypercholesterolemia, unspecified: Secondary | ICD-10-CM | POA: Insufficient documentation

## 2014-02-11 DIAGNOSIS — M129 Arthropathy, unspecified: Secondary | ICD-10-CM | POA: Insufficient documentation

## 2014-02-11 DIAGNOSIS — R059 Cough, unspecified: Secondary | ICD-10-CM | POA: Insufficient documentation

## 2014-02-11 DIAGNOSIS — R5381 Other malaise: Secondary | ICD-10-CM | POA: Insufficient documentation

## 2014-02-11 DIAGNOSIS — K219 Gastro-esophageal reflux disease without esophagitis: Secondary | ICD-10-CM | POA: Insufficient documentation

## 2014-02-11 DIAGNOSIS — Z8742 Personal history of other diseases of the female genital tract: Secondary | ICD-10-CM | POA: Insufficient documentation

## 2014-02-11 DIAGNOSIS — I214 Non-ST elevation (NSTEMI) myocardial infarction: Secondary | ICD-10-CM | POA: Insufficient documentation

## 2014-02-11 DIAGNOSIS — I2510 Atherosclerotic heart disease of native coronary artery without angina pectoris: Secondary | ICD-10-CM | POA: Insufficient documentation

## 2014-02-11 LAB — PROTIME-INR
INR: 1.16 (ref 0.00–1.49)
PROTHROMBIN TIME: 14.6 s (ref 11.6–15.2)

## 2014-02-11 LAB — COMPREHENSIVE METABOLIC PANEL
ALBUMIN: 4 g/dL (ref 3.5–5.2)
ALT: 19 U/L (ref 0–35)
AST: 55 U/L — AB (ref 0–37)
Alkaline Phosphatase: 99 U/L (ref 39–117)
BILIRUBIN TOTAL: 0.4 mg/dL (ref 0.3–1.2)
BUN: 24 mg/dL — ABNORMAL HIGH (ref 6–23)
CHLORIDE: 99 meq/L (ref 96–112)
CO2: 25 mEq/L (ref 19–32)
CREATININE: 1.1 mg/dL (ref 0.50–1.10)
Calcium: 9.5 mg/dL (ref 8.4–10.5)
GFR calc Af Amer: 57 mL/min — ABNORMAL LOW (ref >90)
GFR calc non Af Amer: 49 mL/min — ABNORMAL LOW (ref >90)
Glucose, Bld: 376 mg/dL — ABNORMAL HIGH (ref 70–99)
Potassium: 4.5 mEq/L (ref 3.7–5.3)
Sodium: 139 mEq/L (ref 137–147)
Total Protein: 6.8 g/dL (ref 6.0–8.3)

## 2014-02-11 LAB — CBC WITH DIFFERENTIAL/PLATELET
BASOS ABS: 0 10*3/uL (ref 0.0–0.1)
BASOS PCT: 0 % (ref 0–1)
Eosinophils Absolute: 0 10*3/uL (ref 0.0–0.7)
Eosinophils Relative: 0 % (ref 0–5)
HEMATOCRIT: 33 % — AB (ref 36.0–46.0)
Hemoglobin: 10.8 g/dL — ABNORMAL LOW (ref 12.0–15.0)
LYMPHS PCT: 15 % (ref 12–46)
Lymphs Abs: 1.3 10*3/uL (ref 0.7–4.0)
MCH: 29.9 pg (ref 26.0–34.0)
MCHC: 32.7 g/dL (ref 30.0–36.0)
MCV: 91.4 fL (ref 78.0–100.0)
MONO ABS: 0.7 10*3/uL (ref 0.1–1.0)
Monocytes Relative: 7 % (ref 3–12)
NEUTROS ABS: 6.8 10*3/uL (ref 1.7–7.7)
Neutrophils Relative %: 78 % — ABNORMAL HIGH (ref 43–77)
PLATELETS: 261 10*3/uL (ref 150–400)
RBC: 3.61 MIL/uL — ABNORMAL LOW (ref 3.87–5.11)
RDW: 13 % (ref 11.5–15.5)
WBC: 8.8 10*3/uL (ref 4.0–10.5)

## 2014-02-11 LAB — PRO B NATRIURETIC PEPTIDE: Pro B Natriuretic peptide (BNP): 15162 pg/mL — ABNORMAL HIGH (ref 0–125)

## 2014-02-11 LAB — TROPONIN I: Troponin I: 0.73 ng/mL (ref <0.30)

## 2014-02-11 MED ORDER — NITROGLYCERIN 0.4 MG SL SUBL
0.4000 mg | SUBLINGUAL_TABLET | SUBLINGUAL | Status: AC | PRN
  Administered 2014-02-11 (×3): 0.4 mg via SUBLINGUAL
  Filled 2014-02-11: qty 1
  Filled 2014-02-11: qty 25

## 2014-02-11 MED ORDER — NITROGLYCERIN 2 % TD OINT
1.0000 [in_us] | TOPICAL_OINTMENT | Freq: Once | TRANSDERMAL | Status: AC
  Administered 2014-02-11: 1 [in_us] via TOPICAL
  Filled 2014-02-11: qty 1

## 2014-02-11 MED ORDER — ASPIRIN 325 MG PO TABS
325.0000 mg | ORAL_TABLET | Freq: Once | ORAL | Status: AC
  Administered 2014-02-11: 325 mg via ORAL
  Filled 2014-02-11: qty 1

## 2014-02-11 MED ORDER — MORPHINE SULFATE 4 MG/ML IJ SOLN
4.0000 mg | Freq: Once | INTRAMUSCULAR | Status: AC
  Administered 2014-02-11: 4 mg via INTRAVENOUS
  Filled 2014-02-11: qty 1

## 2014-02-11 NOTE — ED Notes (Signed)
Report to RN at [facility]

## 2014-02-11 NOTE — ED Notes (Signed)
Pt will go to [facility] room 467.

## 2014-02-11 NOTE — ED Provider Notes (Signed)
CSN: [id number]     Arrival date & time 02/11/14  1449 History   First MD Initiated Contact with Patient 02/11/14 1513     Chief Complaint  Patient presents with  . Chest Pain     (Consider location/radiation/quality/duration/timing/severity/associated sxs/prior Treatment) Patient is a 73 y.o. female presenting with chest pain. The history is provided by the patient and the spouse. No language interpreter was used.  Chest Pain Pain location:  L chest Pain quality: pressure   Pain radiates to:  Does not radiate Pain severity:  Moderate Onset quality:  Gradual Duration:  14 hours Timing:  Constant Progression:  Waxing and waning Context: at rest   Relieved by:  Nothing Worsened by:  Nothing tried Ineffective treatments:  Nitroglycerin and rest Associated symptoms: cough, fatigue, nausea, shortness of breath and vomiting   Associated symptoms: no abdominal pain, no back pain, no diaphoresis, no fever, no headache, no lower extremity edema, no near-syncope, no numbness, no palpitations and no weakness   Cough:    Cough characteristics:  Dry   Sputum characteristics:  Nondescript   Severity:  Moderate   Duration:  14 hours   Timing:  Intermittent   Progression:  Unchanged   Chronicity:  New Shortness of breath:    Severity:  Moderate   Onset quality:  Sudden   Duration:  14 hours   Timing:  Constant   Progression:  Unchanged Risk factors: aortic disease, coronary artery disease, diabetes mellitus, high cholesterol, hypertension, obesity and prior DVT/PE   Risk factors: no smoking     Past Medical History  Diagnosis Date  . Diabetes mellitus   . Hypertension   . Coronary artery disease   . Colitis   . Neuropathy   . Chronic pain   . GERD (gastroesophageal reflux disease)   . Gastroparesis   . Fibrocystic breast   . Thyroid disease   . Arthritis   . History of blood clots   . High cholesterol   . MI (myocardial infarction)   . DVT (deep venous thrombosis)   .  Pulmonary embolism    Past Surgical History  Procedure Laterality Date  . Abdominal hysterectomy    . Cardiac surgery    . Tonsillectomy    . Cardiac surgery    . Ankle surgery    . Wrist surgery    . Cateract surgery     History reviewed. No pertinent family history. History  Substance Use Topics  . Smoking status: Never Smoker   . Smokeless tobacco: Not on file  . Alcohol Use: No   OB History   Grav Para Term Preterm Abortions TAB SAB Ect Mult Living                 Review of Systems  Constitutional: Positive for fatigue. Negative for fever, chills, diaphoresis, activity change and appetite change.  HENT: Negative for congestion, facial swelling, rhinorrhea and sore throat.   Eyes: Negative for photophobia and discharge.  Respiratory: Positive for cough and shortness of breath. Negative for chest tightness.   Cardiovascular: Positive for chest pain. Negative for palpitations, leg swelling and near-syncope.  Gastrointestinal: Positive for nausea and vomiting. Negative for abdominal pain and diarrhea.  Endocrine: Negative for polydipsia and polyuria.  Genitourinary: Negative for dysuria, frequency, difficulty urinating and pelvic pain.  Musculoskeletal: Negative for arthralgias, back pain, neck pain and neck stiffness.  Skin: Negative for color change and wound.  Allergic/Immunologic: Negative for immunocompromised state.  Neurological: Negative for facial asymmetry,  weakness, numbness and headaches.  Hematological: Does not bruise/bleed easily.  Psychiatric/Behavioral: Negative for confusion and agitation.      Allergies  Sulfa antibiotics  Home Medications   Prior to Admission medications   Medication Sig Start Date End Date Taking? Authorizing Provider  Rivaroxaban (XARELTO) 15 MG TABS tablet Take 15 mg by mouth daily with supper.   Yes Historical Provider, MD  Armodafinil (NUVIGIL) 250 MG tablet Take 250 mg by mouth daily.    Historical Provider, MD  Ascorbic  Acid (VITAMIN C PO) Take 1 tablet by mouth daily.    Historical Provider, MD  aspirin 81 MG tablet Take 81 mg by mouth daily.    Historical Provider, MD  Calcium Acetate, Phos Binder, (CALCIUM ACETATE PO) Take 1 tablet by mouth daily.    Historical Provider, MD  diclofenac (FLECTOR) 1.3 % PTCH Place 1 patch onto the skin 2 (two) times daily. Patient uses this on her back.    Historical Provider, MD  furosemide (LASIX) 20 MG tablet Take 20 mg by mouth 2 (two) times daily.    Historical Provider, MD  gabapentin (NEURONTIN) 300 MG capsule Take 300 mg by mouth 3 (three) times daily.    Historical Provider, MD  Insulin Human (INSULIN PUMP) 100 unit/ml SOLN Inject 22.15 each into the skin 4 (four) times daily. Insulin pump:  @ midnight .09 units of insulin, @ 3:30 am 1.0 units of insulin, @ 9 am .80 units of insulin, @ 6 pm 1.05 units of insulin.    Historical Provider, MD  levothyroxine (SYNTHROID, LEVOTHROID) 112 MCG tablet Take 112 mcg by mouth daily.    Historical Provider, MD  lidocaine (LIDODERM) 5 % Place 1 patch onto the skin daily. Remove & Discard patch within 12 hours or as directed by MD    Historical Provider, MD  lisinopril (PRINIVIL,ZESTRIL) 20 MG tablet Take 20 mg by mouth daily.    Historical Provider, MD  Mesalamine (ASACOL HD) 800 MG TBEC Take 1 tablet by mouth daily.    Historical Provider, MD  modafinil (PROVIGIL) 200 MG tablet Take 200 mg by mouth daily.    Historical Provider, MD  Multiple Vitamins-Minerals (MULTIVITAMIN PO) Take 1 tablet by mouth daily.    Historical Provider, MD  Neomycin-Bacitracin-Polymyxin ([employer] TRIPLE ANTIBIOTIC) OINT Apply 1 application topically 2 (two) times daily. 07/20/13   Blanchard Kelch, MD  omeprazole (PRILOSEC) 40 MG capsule Take 40 mg by mouth daily.    Historical Provider, MD  Ondansetron HCl (ZOFRAN PO) Take 1 tablet by mouth daily as needed. For nausea.    Historical Provider, MD  oxyCODONE-acetaminophen (PERCOCET) 5-325 MG per tablet Take 1  tablet by mouth every 4 (four) hours as needed. Patient uses this medication for pain.    Historical Provider, MD  Oxymorphone HCl, Crush Resist, (OPANA ER, CRUSH RESISTANT,) 40 MG TB12 Take 1 tablet by mouth daily.    Historical Provider, MD  polyethylene glycol (MIRALAX / GLYCOLAX) packet Take 17 g by mouth daily.    Historical Provider, MD  rosuvastatin (CRESTOR) 5 MG tablet Take 5 mg by mouth daily. Patient takes 2.5 mg of this medication every night, except for on Wednesday and Friday.  Then she takes the 5 mg tablet.    Historical Provider, MD  tiZANidine (ZANAFLEX) 4 MG tablet Take 4 mg by mouth 3 (three) times daily.    Historical Provider, MD  urea (CARMOL) 40 % CREA Apply 1 application topically daily. Patient applies this medication to her feet.  Historical Provider, MD  vitamin E 100 UNIT capsule Take 100 Units by mouth daily.    Historical Provider, MD  zinc gluconate 50 MG tablet Take 50 mg by mouth daily.    Historical Provider, MD   BP 144/59  Pulse 68  Temp(Src) 97.6 F (36.4 C) (Oral)  Resp 17  SpO2 97% Physical Exam  Constitutional: She is oriented to person, place, and time. She appears well-developed and well-nourished. No distress.  HENT:  Head: Normocephalic and atraumatic.  Mouth/Throat: No oropharyngeal exudate.  Eyes: Pupils are equal, round, and reactive to light.  Neck: Normal range of motion. Neck supple.  Cardiovascular: Normal rate, regular rhythm and normal heart sounds.  Exam reveals no gallop and no friction rub.   No murmur heard. Pulmonary/Chest: Effort normal and breath sounds normal. No respiratory distress. She has no wheezes. She has no rales.  Abdominal: Soft. Bowel sounds are normal. She exhibits no distension and no mass. There is no tenderness. There is no rebound and no guarding.  Musculoskeletal: Normal range of motion. She exhibits edema (1+ BLLE edema). She exhibits no tenderness.  Neurological: She is alert and oriented to person, place,  and time.  Skin: Skin is warm and dry.  Psychiatric: She has a normal mood and affect.    ED Course  Procedures (including critical care time) Labs Review Labs Reviewed  CBC WITH DIFFERENTIAL - Abnormal; Notable for the following:    RBC 3.61 (*)    Hemoglobin 10.8 (*)    HCT 33.0 (*)    Neutrophils Relative % 78 (*)    All other components within normal limits  COMPREHENSIVE METABOLIC PANEL - Abnormal; Notable for the following:    Glucose, Bld 376 (*)    BUN 24 (*)    AST 55 (*)    GFR calc non Af Amer 49 (*)    GFR calc Af Amer 57 (*)    All other components within normal limits  TROPONIN I - Abnormal; Notable for the following:    Troponin I 0.73 (*)    All other components within normal limits  PRO B NATRIURETIC PEPTIDE - Abnormal; Notable for the following:    Pro B Natriuretic peptide (BNP) 15162.0 (*)    All other components within normal limits  PROTIME-INR    Imaging Review Dg Chest Port 1 View  02/11/2014   CLINICAL DATA:  CP, SOB CP, SOB  EXAM: PORTABLE CHEST - 1 VIEW  COMPARISON:  DG CHEST 2 VIEW dated 01/20/2014  FINDINGS: Cardiac silhouette is enlarged. Low lung volumes. There is diffuse prominence of the interstitial markings and areas of peribronchial cuffing. No new focal regions of consolidation or new infiltrates. Osseous structures unremarkable.  IMPRESSION: Stable interstitial densities and cardiomegaly. Chronic bronchitic changes diagnostic consideration. Component of pulmonary vascular congestion not excluded.   Electronically Signed   By: Margaree Mackintosh M.D.   On: 02/11/2014 15:55     EKG Interpretation   Date/Time:  Tuesday February 11 2014 15:03:19 EDT Ventricular Rate:  69 PR Interval:  174 QRS Duration: 86 QT Interval:  464 QTC Calculation: 497 R Axis:   153 Text Interpretation:  Normal sinus rhythm Left posterior fascicular block  Nonspecific ST abnormality New TWI V2 Prolonged QT Abnormal ECG Confirmed  by DOCHERTY  MD, MEGAN ([id number]) on  02/11/2014 3:09:15 PM      MDM   Final diagnoses:  NSTEMI (non-ST elevated myocardial infarction)  CHF exacerbation    Pt is a 73 y.o.  female with Pmhx as above including CAD w/ 4 stents, who presents with about 14 hrs of constant L sided CP w/ assoc SOB, n/v, cough.  She was seen at endo office in [place], encouraged to go ED, but refused instead coming here in hopes to being transferred to [facility] where her cardiologist is located, but skipping [facility] ED. EKG w/ new TWI V2, Trop elevated at 0.73, BNP elevated a 15,162, CXR "Stable interstitial densities and cardiomegaly. Chronic bronchitic changes diagnostic consideration. Component of pulmonary vascular congestion not excluded". ASA given. Pain not improved w/ 3 SL NTG. IV morphine given. Cannot start heparin as she last took her QD Xarelto at 7pm yesterday. Cardiology at [facility] consulted for transfer to likely NSTEMI, will call me back w/ accepting Dr and bed availability.    4:56 PM Dr. Quillian Quince will accept to Cardiac telemetry. Agree with plan to hold heparin given <24hrs from last Xarelto.          Neta Ehlers, MD 02/11/14 [phone]

## 2014-02-11 NOTE — ED Notes (Signed)
Pt reports that she had a MI and PE in December.  States that she started having substernal CP, N/V, SOB last night.  Vomited multiple times.  States that she went to her endocrinologist at [place] today and was told to come to the ED for eval.  pt very weak, SOB, labored breathing.

## 2014-02-11 NOTE — ED Notes (Signed)
MD at bedside. 

## 2014-06-04 ENCOUNTER — Encounter (HOSPITAL_BASED_OUTPATIENT_CLINIC_OR_DEPARTMENT_OTHER): Payer: Self-pay | Admitting: Emergency Medicine

## 2014-06-04 ENCOUNTER — Emergency Department (HOSPITAL_BASED_OUTPATIENT_CLINIC_OR_DEPARTMENT_OTHER)
Admission: EM | Admit: 2014-06-04 | Discharge: 2014-06-04 | Disposition: A | Discharge status: Home / Self Care | Payer: Medicare Other | Attending: Emergency Medicine | Admitting: Emergency Medicine

## 2014-06-04 DIAGNOSIS — E7800 Pure hypercholesterolemia, unspecified: Secondary | ICD-10-CM | POA: Insufficient documentation

## 2014-06-04 DIAGNOSIS — Z79899 Other long term (current) drug therapy: Secondary | ICD-10-CM | POA: Diagnosis not present

## 2014-06-04 DIAGNOSIS — M129 Arthropathy, unspecified: Secondary | ICD-10-CM | POA: Insufficient documentation

## 2014-06-04 DIAGNOSIS — Z86711 Personal history of pulmonary embolism: Secondary | ICD-10-CM | POA: Diagnosis not present

## 2014-06-04 DIAGNOSIS — I252 Old myocardial infarction: Secondary | ICD-10-CM | POA: Diagnosis not present

## 2014-06-04 DIAGNOSIS — Z86718 Personal history of other venous thrombosis and embolism: Secondary | ICD-10-CM | POA: Insufficient documentation

## 2014-06-04 DIAGNOSIS — Z9889 Other specified postprocedural states: Secondary | ICD-10-CM | POA: Insufficient documentation

## 2014-06-04 DIAGNOSIS — T48204A Poisoning by unspecified drugs acting on muscles, undetermined, initial encounter: Secondary | ICD-10-CM | POA: Insufficient documentation

## 2014-06-04 DIAGNOSIS — E119 Type 2 diabetes mellitus without complications: Secondary | ICD-10-CM | POA: Diagnosis not present

## 2014-06-04 DIAGNOSIS — E079 Disorder of thyroid, unspecified: Secondary | ICD-10-CM | POA: Insufficient documentation

## 2014-06-04 DIAGNOSIS — Z7982 Long term (current) use of aspirin: Secondary | ICD-10-CM | POA: Diagnosis not present

## 2014-06-04 DIAGNOSIS — K219 Gastro-esophageal reflux disease without esophagitis: Secondary | ICD-10-CM | POA: Insufficient documentation

## 2014-06-04 DIAGNOSIS — Z7901 Long term (current) use of anticoagulants: Secondary | ICD-10-CM | POA: Diagnosis not present

## 2014-06-04 DIAGNOSIS — G8929 Other chronic pain: Secondary | ICD-10-CM | POA: Diagnosis not present

## 2014-06-04 DIAGNOSIS — Z7902 Long term (current) use of antithrombotics/antiplatelets: Secondary | ICD-10-CM | POA: Insufficient documentation

## 2014-06-04 DIAGNOSIS — Y9389 Activity, other specified: Secondary | ICD-10-CM | POA: Insufficient documentation

## 2014-06-04 DIAGNOSIS — Z794 Long term (current) use of insulin: Secondary | ICD-10-CM | POA: Diagnosis not present

## 2014-06-04 DIAGNOSIS — Z9861 Coronary angioplasty status: Secondary | ICD-10-CM | POA: Insufficient documentation

## 2014-06-04 DIAGNOSIS — Z8742 Personal history of other diseases of the female genital tract: Secondary | ICD-10-CM | POA: Diagnosis not present

## 2014-06-04 DIAGNOSIS — T48201A Poisoning by unspecified drugs acting on muscles, accidental (unintentional), initial encounter: Secondary | ICD-10-CM | POA: Diagnosis not present

## 2014-06-04 DIAGNOSIS — Z792 Long term (current) use of antibiotics: Secondary | ICD-10-CM | POA: Diagnosis not present

## 2014-06-04 DIAGNOSIS — I2510 Atherosclerotic heart disease of native coronary artery without angina pectoris: Secondary | ICD-10-CM | POA: Diagnosis not present

## 2014-06-04 DIAGNOSIS — G589 Mononeuropathy, unspecified: Secondary | ICD-10-CM | POA: Insufficient documentation

## 2014-06-04 DIAGNOSIS — I10 Essential (primary) hypertension: Secondary | ICD-10-CM | POA: Diagnosis not present

## 2014-06-04 DIAGNOSIS — Y929 Unspecified place or not applicable: Secondary | ICD-10-CM | POA: Diagnosis not present

## 2014-06-04 DIAGNOSIS — T50901A Poisoning by unspecified drugs, medicaments and biological substances, accidental (unintentional), initial encounter: Secondary | ICD-10-CM

## 2014-06-04 NOTE — ED Provider Notes (Signed)
CSN: [id number]     Arrival date & time 06/04/14  1048 History   First MD Initiated Contact with Patient 06/04/14 1107     Chief Complaint  Patient presents with  . Ingestion     (Consider location/radiation/quality/duration/timing/severity/associated sxs/prior Treatment) Patient is a 73 y.o. female presenting with Ingested Medication.  Ingestion This is a new problem. Episode onset: last dose 2 hours ago. The problem occurs constantly. The problem has not changed since onset.Pertinent negatives include no chest pain, no abdominal pain, no headaches and no shortness of breath. Nothing aggravates the symptoms. Nothing relieves the symptoms.    Past Medical History  Diagnosis Date  . Diabetes mellitus   . Hypertension   . Coronary artery disease   . Colitis   . Neuropathy   . Chronic pain   . GERD (gastroesophageal reflux disease)   . Gastroparesis   . Fibrocystic breast   . Thyroid disease   . Arthritis   . History of blood clots   . High cholesterol   . MI (myocardial infarction)   . DVT (deep venous thrombosis)   . Pulmonary embolism    Past Surgical History  Procedure Laterality Date  . Abdominal hysterectomy    . Cardiac surgery    . Tonsillectomy    . Cardiac surgery    . Ankle surgery    . Wrist surgery    . Cateract surgery    . Cardiac stents    . Joint replacement      bilateral knees   No family history on file. History  Substance Use Topics  . Smoking status: Never Smoker   . Smokeless tobacco: Not on file  . Alcohol Use: No   OB History   Grav Para Term Preterm Abortions TAB SAB Ect Mult Living                 Review of Systems  Constitutional: Negative for fever and chills.  HENT: Negative for congestion, rhinorrhea and sore throat.   Eyes: Negative for photophobia and visual disturbance.  Respiratory: Negative for cough and shortness of breath.   Cardiovascular: Negative for chest pain and leg swelling.  Gastrointestinal: Negative for  nausea, vomiting, abdominal pain, diarrhea and constipation.  Endocrine: Negative for polyphagia and polyuria.  Genitourinary: Negative for dysuria, flank pain, vaginal bleeding, vaginal discharge and enuresis.  Musculoskeletal: Negative for back pain and gait problem.  Skin: Negative for color change and rash.  Neurological: Negative for dizziness, syncope, light-headedness, numbness and headaches.  Hematological: Negative for adenopathy. Does not bruise/bleed easily.  All other systems reviewed and are negative.     Allergies  Clindamycin/lincomycin and Sulfa antibiotics  Home Medications   Prior to Admission medications   Medication Sig Start Date End Date Taking? Authorizing Provider  Ascorbic Acid (VITAMIN C PO) Take 1 tablet by mouth daily.   Yes Historical Provider, MD  Calcium Acetate, Phos Binder, (CALCIUM ACETATE PO) Take 1 tablet by mouth daily.   Yes Historical Provider, MD  Carvedilol (COREG PO) Take 6 mg by mouth.    Yes Historical Provider, MD  diclofenac (FLECTOR) 1.3 % PTCH Place 1 patch onto the skin 2 (two) times daily. Patient uses this on her back.   Yes Historical Provider, MD  furosemide (LASIX) 20 MG tablet Take 20 mg by mouth daily.    Yes Historical Provider, MD  gabapentin (NEURONTIN) 300 MG capsule Take 300 mg by mouth 3 (three) times daily.   Yes Historical Provider, MD  Insulin Human (INSULIN PUMP) 100 unit/ml SOLN Inject 22.15 each into the skin 4 (four) times daily. Insulin pump:  @ midnight .09 units of insulin, @ 3:30 am 1.0 units of insulin, @ 9 am .80 units of insulin, @ 6 pm 1.05 units of insulin.   Yes Historical Provider, MD  levothyroxine (SYNTHROID, LEVOTHROID) 112 MCG tablet Take 224 mcg by mouth daily.    Yes Historical Provider, MD  lidocaine (LIDODERM) 5 % Place 1 patch onto the skin daily. Remove & Discard patch within 12 hours or as directed by MD   Yes Historical Provider, MD  lisinopril (PRINIVIL,ZESTRIL) 20 MG tablet Take 20 mg by mouth  daily.   Yes Historical Provider, MD  Mesalamine (ASACOL HD) 800 MG TBEC Take 1 tablet by mouth daily.   Yes Historical Provider, MD  Multiple Vitamins-Minerals (MULTIVITAMIN PO) Take 1 tablet by mouth daily.   Yes Historical Provider, MD  omeprazole (PRILOSEC) 40 MG capsule Take 40 mg by mouth daily.   Yes Historical Provider, MD  Ondansetron HCl (ZOFRAN PO) Take 1 tablet by mouth daily as needed. For nausea.   Yes Historical Provider, MD  oxyCODONE-acetaminophen (PERCOCET) 5-325 MG per tablet Take 1 tablet by mouth every 4 (four) hours as needed. Patient uses this medication for pain.   Yes Historical Provider, MD  Oxymorphone HCl, Crush Resist, (OPANA ER, CRUSH RESISTANT,) 40 MG TB12 Take 1 tablet by mouth 2 times daily at 12 noon and 4 pm.    Yes Historical Provider, MD  polyethylene glycol (MIRALAX / GLYCOLAX) packet Take 17 g by mouth daily.   Yes Historical Provider, MD  Rivaroxaban (XARELTO) 15 MG TABS tablet Take 15 mg by mouth daily with supper.   Yes Historical Provider, MD  rosuvastatin (CRESTOR) 5 MG tablet Take 5 mg by mouth daily. Patient takes 2.5 mg of this medication every night, except for on Wednesday and Friday.  Then she takes the 5 mg tablet.   Yes Historical Provider, MD  sucralfate (CARAFATE) 1 G tablet Take 1 g by mouth 2 (two) times daily.   Yes Historical Provider, MD  Ticagrelor (BRILINTA PO) Take 90 mg by mouth.   Yes Historical Provider, MD  tiZANidine (ZANAFLEX) 4 MG tablet Take 4 mg by mouth 3 (three) times daily.   Yes Historical Provider, MD  urea (CARMOL) 40 % CREA Apply 1 application topically daily. Patient applies this medication to her feet.   Yes Historical Provider, MD  vitamin E 100 UNIT capsule Take 100 Units by mouth daily.   Yes Historical Provider, MD  Armodafinil (NUVIGIL) 250 MG tablet Take 250 mg by mouth daily.    Historical Provider, MD  aspirin 81 MG tablet Take 81 mg by mouth daily.    Historical Provider, MD  modafinil (PROVIGIL) 200 MG tablet  Take 200 mg by mouth daily.    Historical Provider, MD  Neomycin-Bacitracin-Polymyxin ([employer] TRIPLE ANTIBIOTIC) OINT Apply 1 application topically 2 (two) times daily. 07/20/13   Pamella Pert, MD  zinc gluconate 50 MG tablet Take 50 mg by mouth daily.    Historical Provider, MD   BP 111/48  Pulse 60  Temp(Src) 97.7 F (36.5 C) (Oral)  Resp 16  Ht 5\' 2"  (1.575 m)  Wt 279 lb (126.554 kg)  BMI 51.02 kg/m2  SpO2 95% Physical Exam  Vitals reviewed. Constitutional: She is oriented to person, place, and time. She appears well-developed and well-nourished.  HENT:  Head: Normocephalic and atraumatic.  Right Ear: External ear normal.  Left Ear: External ear normal.  Eyes: Conjunctivae and EOM are normal. Pupils are equal, round, and reactive to light.  Neck: Normal range of motion. Neck supple.  Cardiovascular: Normal rate, regular rhythm, normal heart sounds and intact distal pulses.   Pulmonary/Chest: Effort normal and breath sounds normal.  Abdominal: Soft. Bowel sounds are normal. There is no tenderness.  Musculoskeletal: Normal range of motion.  Neurological: She is alert and oriented to person, place, and time. She has normal strength. No cranial nerve deficit or sensory deficit.  Skin: Skin is warm and dry.    ED Course  Procedures (including critical care time) Labs Review Labs Reviewed - No data to display  Imaging Review No results found.   EKG Interpretation   Date/Time:  Wednesday June 04 2014 11:50:07 EDT Ventricular Rate:  56 PR Interval:  190 QRS Duration: 88 QT Interval:  466 QTC Calculation: 449 R Axis:   57 Text Interpretation:  Sinus bradycardia Nonspecific T wave abnormality  Abnormal ECG Prolonged QT No significant change was found Confirmed by  Debby Freiberg [phone]) on 06/04/2014 1:01:05 PM      MDM   Final diagnoses:  Drug ingestion, accidental or unintentional, initial encounter    73 y.o. female  with pertinent PMH of coronary artery  disease, DVT, PE, hypertension, chronic neuropathy and pain secondary to diabetes presents with ingestion of approximately 20 to 24 mg of Zanaflex approximately 2 hours prior to arrival. Per her husband this is a mix up in the weight she is normally administered her pills and per his history I am not concerned about intentional overdose or any safety issues in the home this appears to be an isolated incident. The patient's only symptoms have been questionably a mild amount of drowsiness however the patient is awake, alert, and answering all questions and is not sleeping, by exam. Per the husband the patient is in her normal state of health at this time. Physical exam as above is unremarkable. I spoke with poison control who advised 4 hours of monitoring after her last dose of medication which is approximately 2 hours prior to arrival so we'll monitor for 2 hours, no labs were recommended at this time as the patient is hemodynamically stable.   1:28 PM patient monitored for 4 hours post ingestion and without significant hypotension or change in symptoms. The patient is at this point stable and considered medically clear from accidental injection.  Discussed standard return precautions for any worsening symptoms with patient and husband, they voiced understanding and agreed to followup.  Labs and imaging as above reviewed.   1. Drug ingestion, accidental or unintentional, initial encounter         Debby Freiberg, MD 06/04/14 1329

## 2014-06-04 NOTE — Discharge Instructions (Signed)
Nontoxic Ingestion Your exam shows your ingestion is not likely to cause serious medical problems. Further treatment is not needed at this time. If you have vomited since your ingestion, you should not drink or eat for at least 2 to 3 hours. Then start with small sips of clear liquids until your stomach settles. You should not drink alcohol or take illegal recreational drugs or other mind-altering substances as this may worsen your condition. Sometimes the effects of drugs and other substances can be delayed. SEEK IMMEDIATE MEDICAL CARE IF:  You develop confusion, sleepiness, agitation, or difficulty walking.  You develop breathing problems, a cough, difficulty swallowing, or excess mucus.  You develop a stomach ache, repeated vomiting, or severe diarrhea.  You develop weakness, fever, or dehydration. Document Released: 11/24/2004 Document Revised: 01/09/2012 Document Reviewed: 11/17/2008 [facility] Patient Information 2015 [place], [place]. This information is not intended to replace advice given to you by your health care provider. Make sure you discuss any questions you have with your health care provider.  Poisoning Information Poisoning is illness caused by eating, drinking, touching, or inhaling a harmful substance. The damaging effects on the person's health will vary depending on the type of poison, the amount of exposure, and the duration of exposure before treatment. These effects may range from mild to very severe or even fatal.  Most poisonings take place in the home and involve common household products. They can also occur in the workplace, especially in industrial or manufacturing facilities. Poisoning is more common in children than adults. However, poisoning often causes more serious illness in adults. Poisonings are often accidental, but there are also many cases in which a person intentionally ingests poison. WHAT THINGS MAY BE POISONOUS?  A poison can be any substance that causes  illness or harm to the body. Poisoning is often caused by products that are commonly found in homes. Many substances can become poisonous if used in ways or amounts that are not appropriate. Some common products that can cause poisoning are:   Medicines, including prescription medicines, over-the-counter pain medicines, vitamins, iron pills, and herbal supplements.  Cleaning or laundry products.  Paint and paint thinner.  Weed or insect killers.  Perfume, hair spray, or nail products.  Alcohol.  Plants, such as philodendron, poinsettia, oleander, castor bean, cactus, and tomato plants.  Batteries.  Furniture polish.  Drain cleaners.  Antifreeze or other automotive products.  Gasoline, lighter fluid, or lamp oil.  Carbon monoxide gas from furnaces or automobiles.  Toxic fumes from the burning of plastics or certain other materials. WHAT ARE SOME FIRST-AID MEASURES FOR POISONING? The local poison control center must be contacted whenever a person may have been exposed to poison. The poison control specialist will often give a set of directions to follow over the phone. These directions may include the following:  Remove any substance that is still in the mouth if the poison was not food or medicine. Drink a small amount of water.  Keep the medicine container if too much medicine or the wrong medicine was swallowed. Use it to identify the medicine to the poison control specialist.  Get away from the area where exposure occurred as soon as possible if the poison was from fumes or chemicals.  Get fresh air as soon as possible if a poison was inhaled.  Remove any affected clothing and rinse the skin with water if a poison got on the skin.  Rinse the eyes with water if a poison or chemical got in the eyes.  Begin cardiopulmonary resuscitation (CPR) if breathing stops. HOW CAN YOU PREVENT POISONING? Take these steps to help prevent poisoning:  Keep medicines and chemical  products in their original containers. Many of these come in child-safe packaging. Store them in areas out of reach of children.  Educate othersabout the dangers of possible poisons.  Read labels before using medicine or household products. Leave the original labels on the containers.  Always turn on a light when taking medicine. Check the dosage every time.   Close the containers tightly after using medicine or chemical products.  Get rid of unneeded and outdated medicines by following the specific disposal instructions on the medicine label or the patient information that came with the medicine. Do not put medicine in the trash or flush it down the toilet. Use the community's drug take-back program to dispose of medicine. If these options are not available, take the medicine out of the original container and mix it with an undesirable substance, such as coffee grounds or kitty litter. Seal the mixture in a sealable bag, can, or other container and throw it away.  Keep all dangerous household products (such as lighter fluid, paint thinner and remover, gasoline, and antifreeze) in locked cabinets.  Do not mix different household chemicals with each other.  Use protective equipment (gloves, goggles, masks, aprons) as needed when using chemicals or cleaners.  Install a carbon monoxide detector in your home. WHEN SHOULD YOU SEEK HELP?  Contact the poison control center wheneveryou suspect that a person has been exposed to poison. Call [phone] (in the [place] to reach a poison center for your area. If you are outside the [place] ask your health care provider what the phone number is for your local poison control center. Keep the phone number posted near your phone. Make sure everyone in your household knows where to find the number. The local emergency services (911 in [place] must be contacted if a person has been exposed to poison and:   Has trouble breathing or stops breathing.  Develops  chest pain.  Has trouble staying awake or becomes unconscious.  Has a seizure.  Has severe vomiting or bleeding.  Has a worsening headache.  Has a decreased level of alertness.  Develops a widespread rash that may or may not be painful.  Has changes in vision.  Has difficulty swallowing.  Develops severe abdominal pain. FOR MORE INFORMATION  American Association of Poison Control Centers: www.aapcc.org Document Released: 10/03/2012 Document Revised: 03/03/2014 Document Reviewed: 10/03/2012 [facility] Patient Information 2015 [place], [place]. This information is not intended to replace advice given to you by your health care provider. Make sure you discuss any questions you have with your health care provider.

## 2014-06-04 NOTE — ED Notes (Signed)
Call placed to Poison Control.  Recommendations include. Mild side effects if ingest < 28mg .  Side effects include: drowiness, dry mouth, hypotension.  Meds act within 30 minutes, peak within 2 hours.  Run EKG, monitor cardiac, and bp.  If hypotension occurs, use iv bolus and treat with Epi or dopamine.  Use po hydration if possible. No labs needed, labs only for severe nausea, vomiting and diarrhea.

## 2014-06-04 NOTE — ED Notes (Signed)
Patient states she accidentally took four or five tizanidine around 0800 today.  States she though she was taking her normal morning meds.  States she than took her normal meds and took another tizanidine at 0930.  States she called her pharmacist and pcp was told to come to the ED.  Denies any symptoms.

## 2014-09-28 IMAGING — CR DG HAND COMPLETE 3+V*L*
2 series · 2 of 2 positions shown · non-contrast
Comparison: None.

CLINICAL DATA: Hand pain and laceration and swelling secondary to a
fall yesterday.

LEFT HAND - COMPLETE 3+ VIEW

[x hand oblique left]
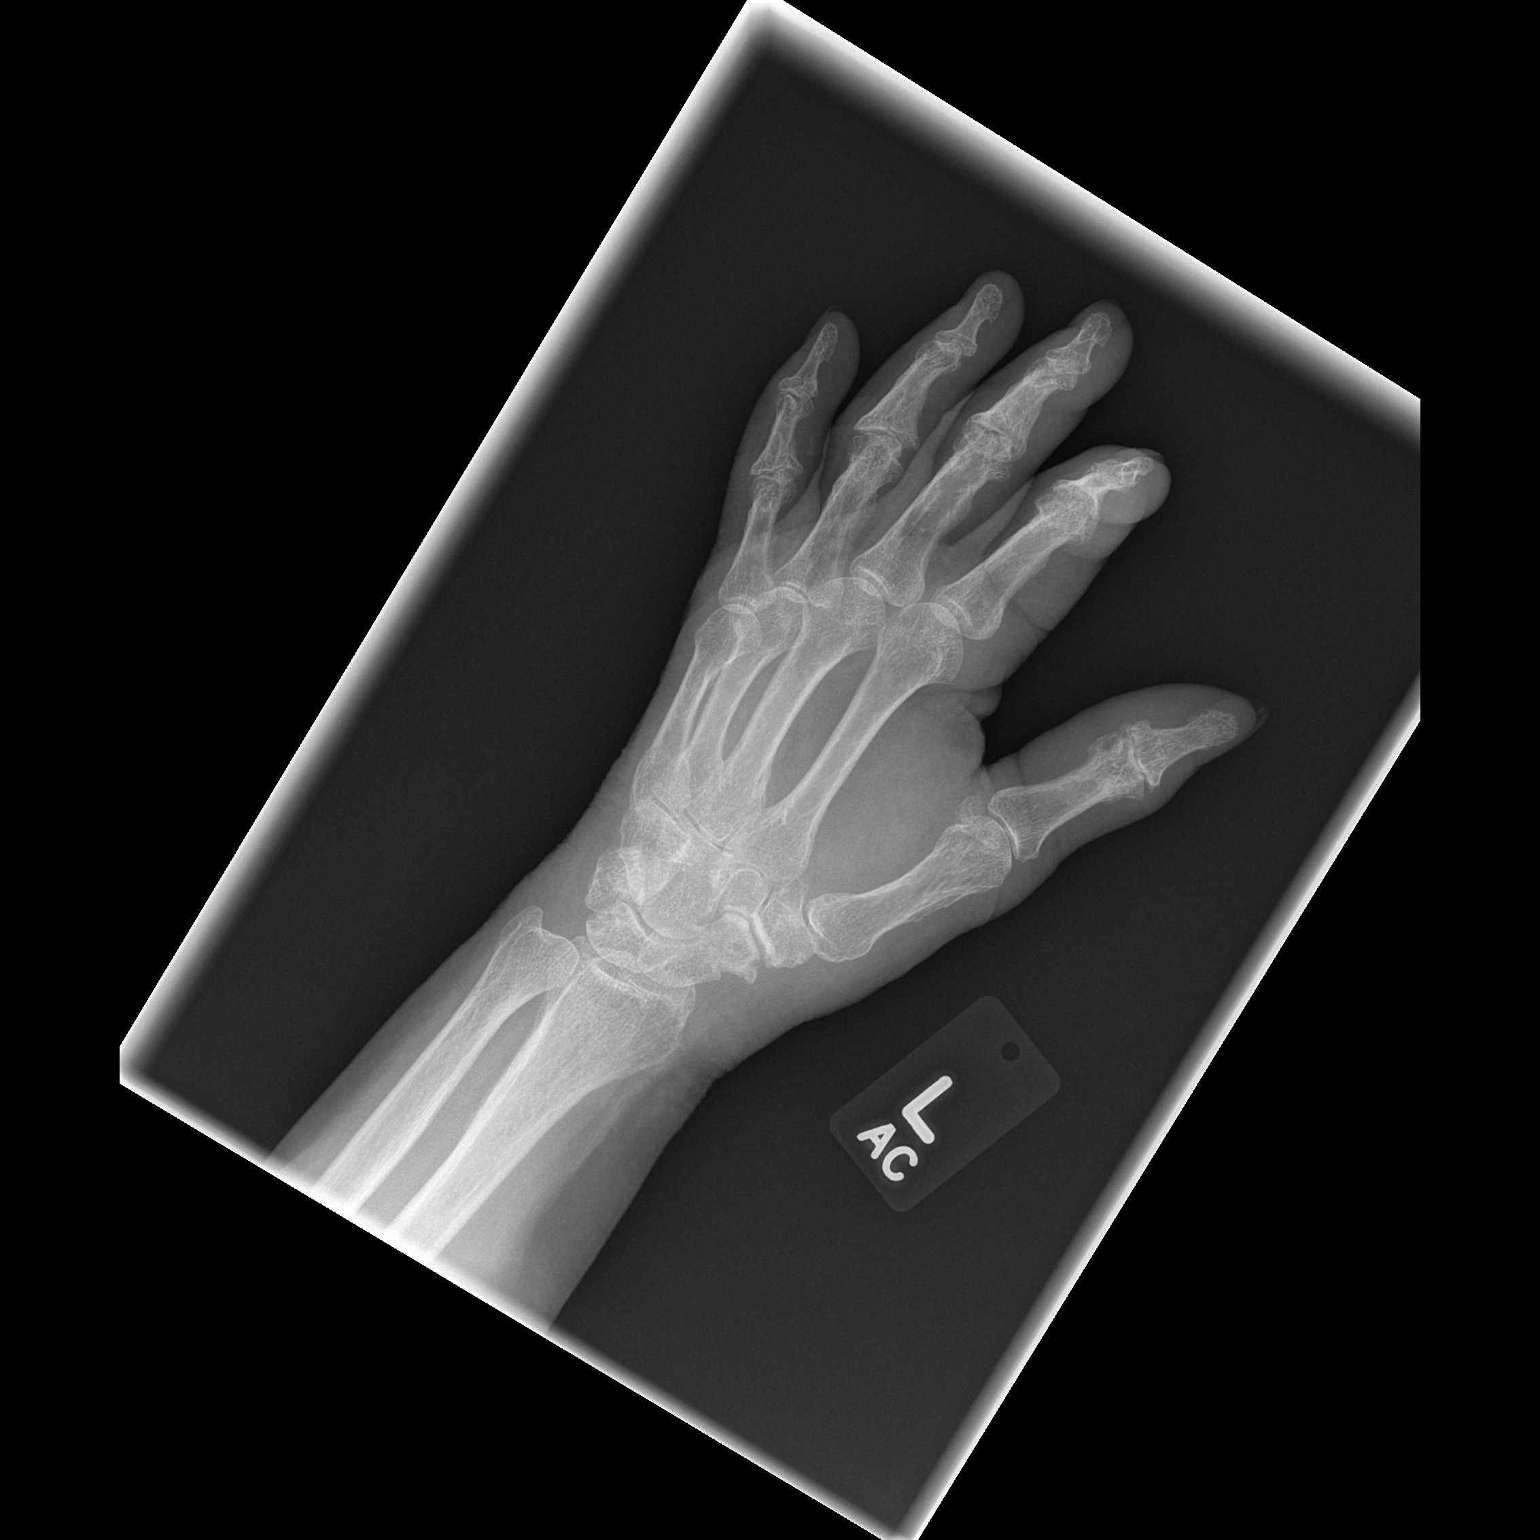

[x hand lat left]
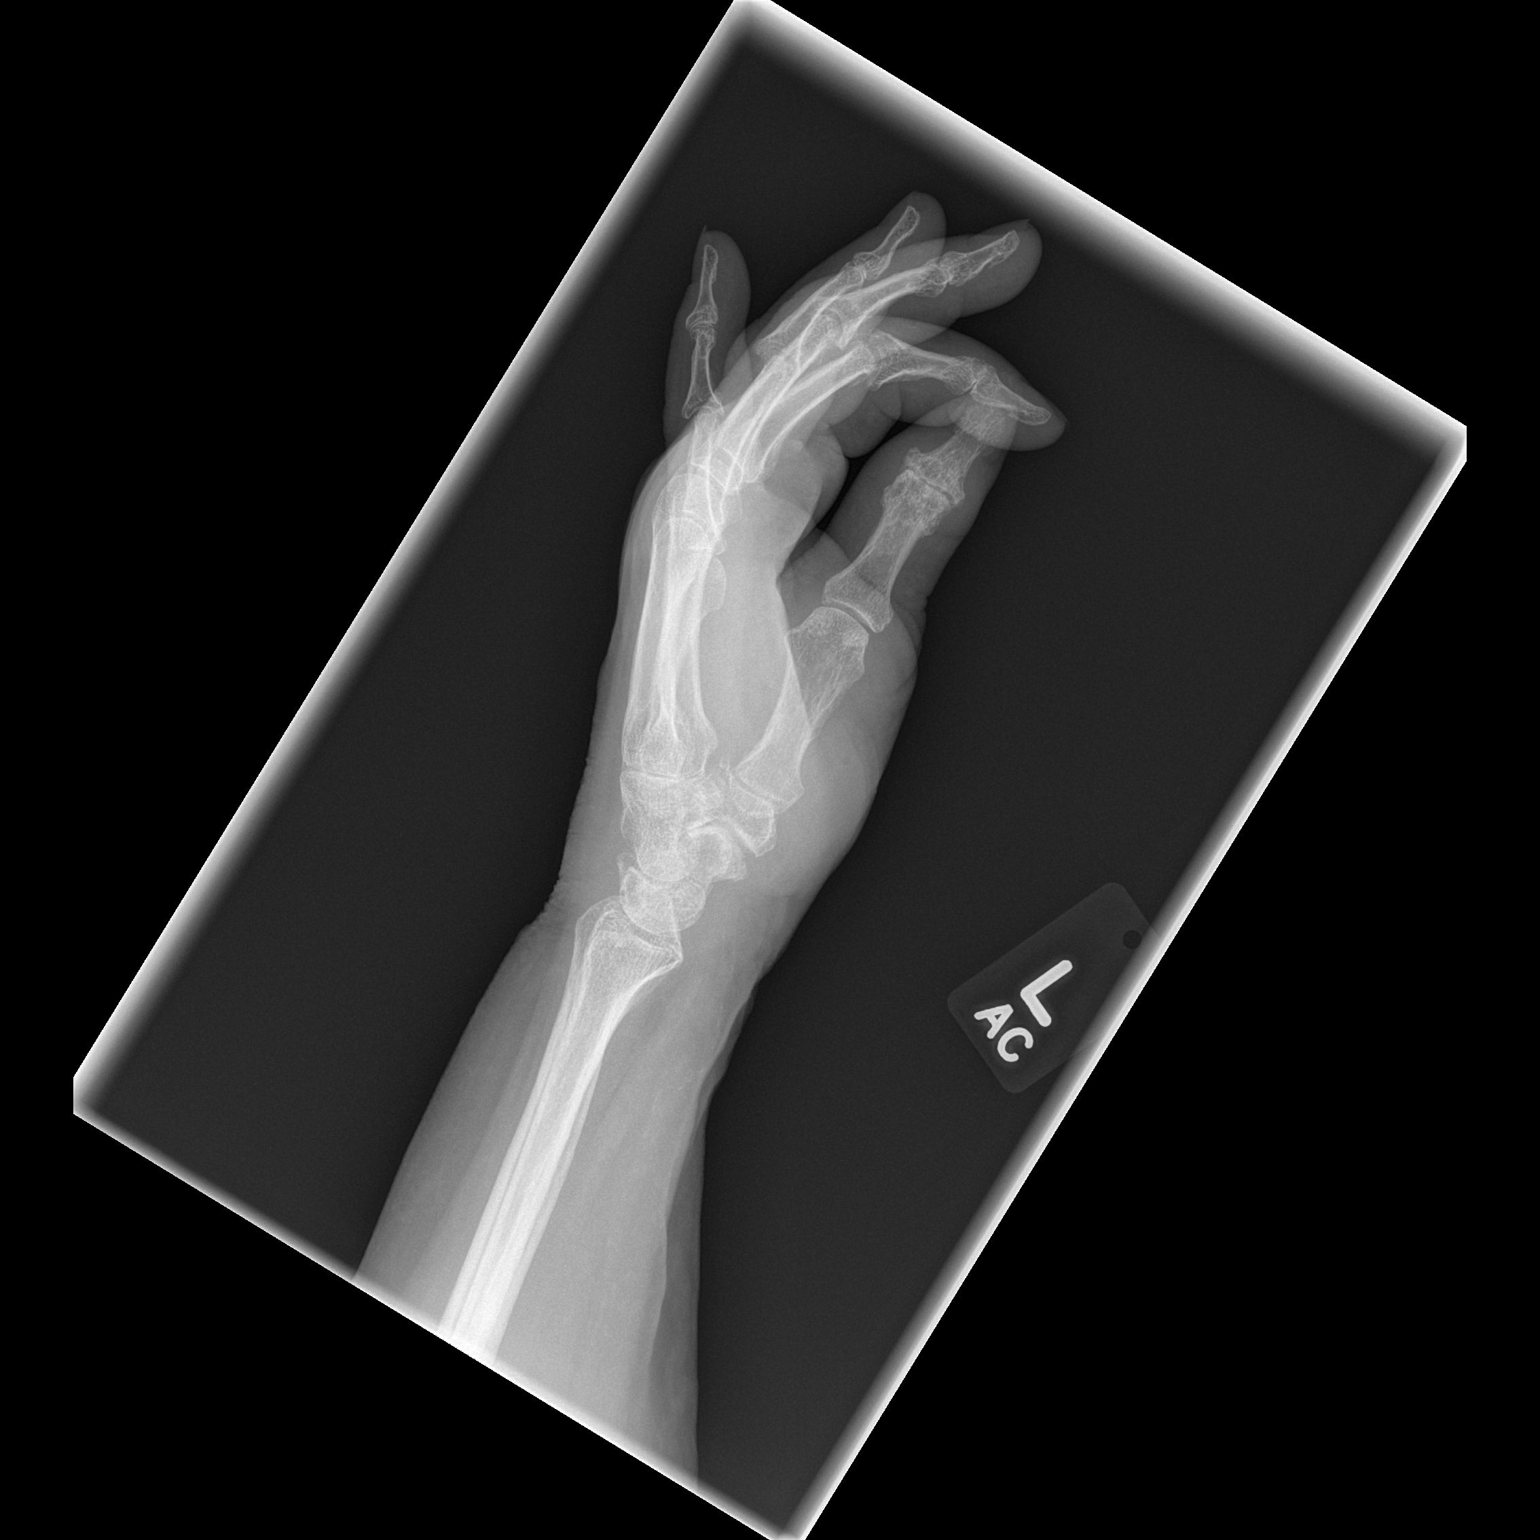

[2 of 2 positions shown; findings below may reference images not displayed]

FINDINGS: There is moderately severe arthritis of the
interphalangeal joints of the hand as well as between the scaphoid
and the trapezium and trapezoid.  There are cystic degenerative
changes of the lunate and scaphoid.  No acute osseous abnormality.
IMPRESSION: No acute osseous abnormality.

## 2014-09-28 IMAGING — CT CT HEAD W/O CM
1 series · 16 of 30 positions shown, 20 images · non-contrast
Comparison: None.

CLINICAL DATA: Fall

EXAM:
CT HEAD WITHOUT CONTRAST
TECHNIQUE: Contiguous axial images were obtained from the base of the skull
through the vertex without intravenous contrast.

[Series 2: head 4.8 h37s · axial · 0.46mm/px · z∈[-158,-18]mm · 16 of 32 slices shown, 20 images]
[im 2/32  brain]
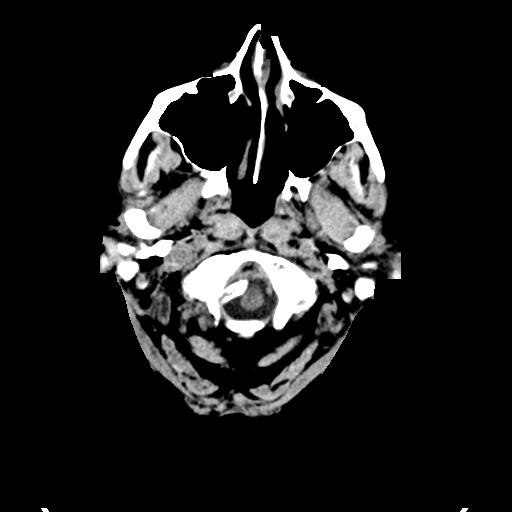
[im 2/32  bone]
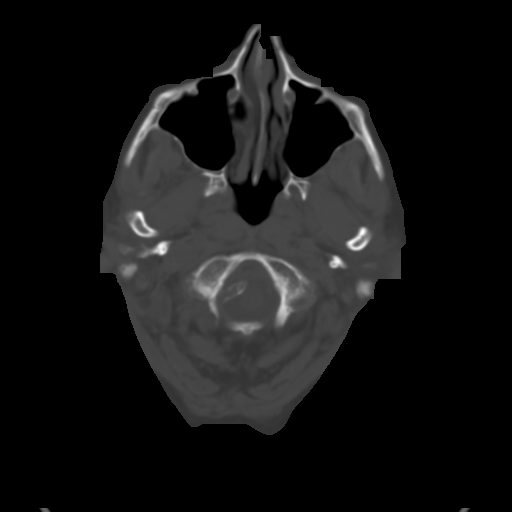
[im 4/32  brain]
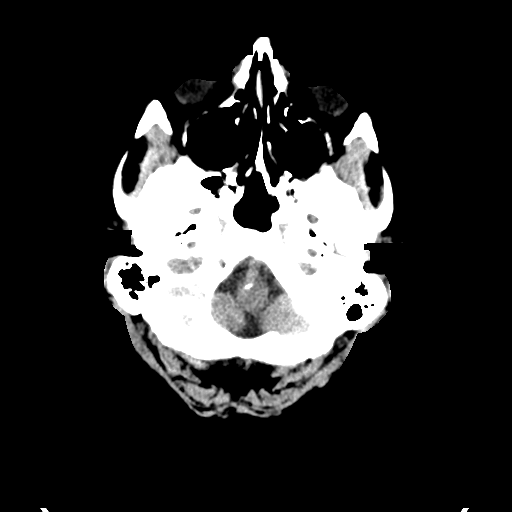
[im 6/32  brain]
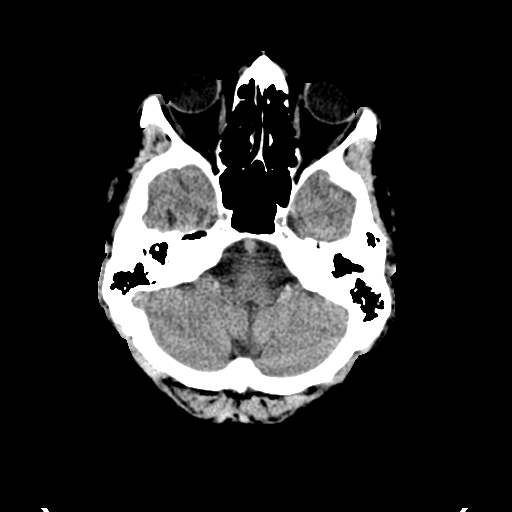
[im 8/32  brain]
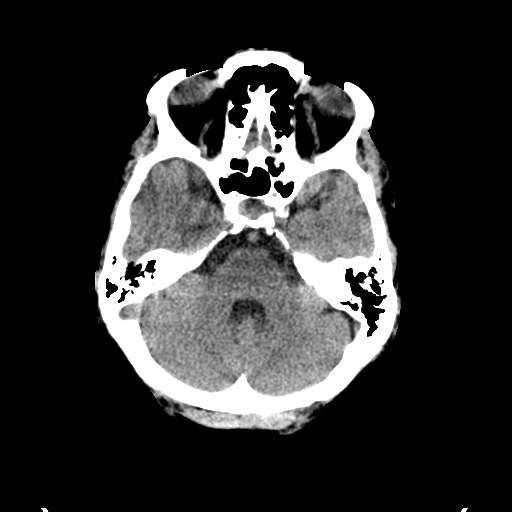
[im 9/32  brain]
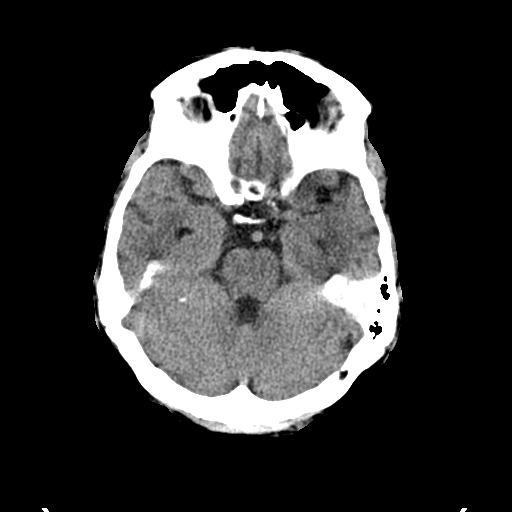
[im 9/32  bone]
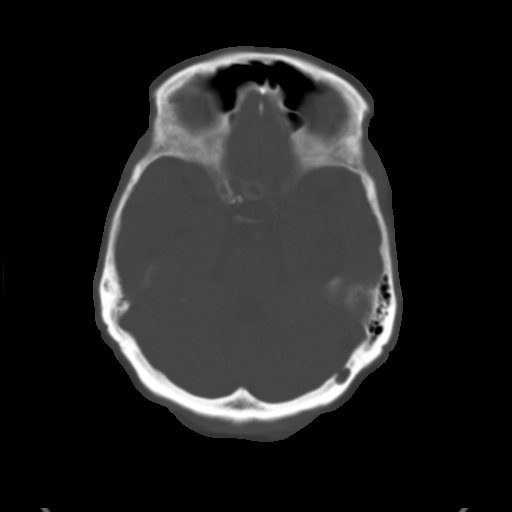
[im 11/32  brain]
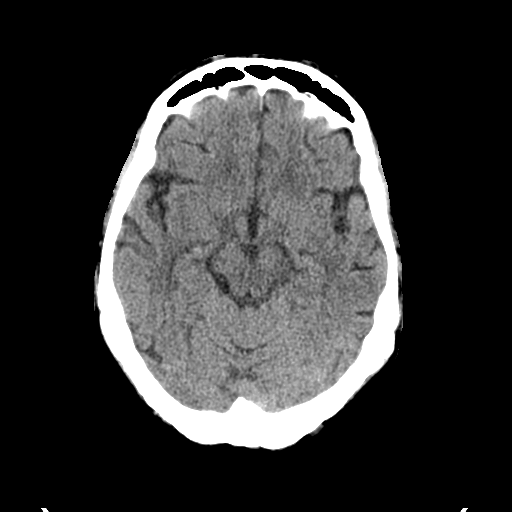
[im 13/32  brain]
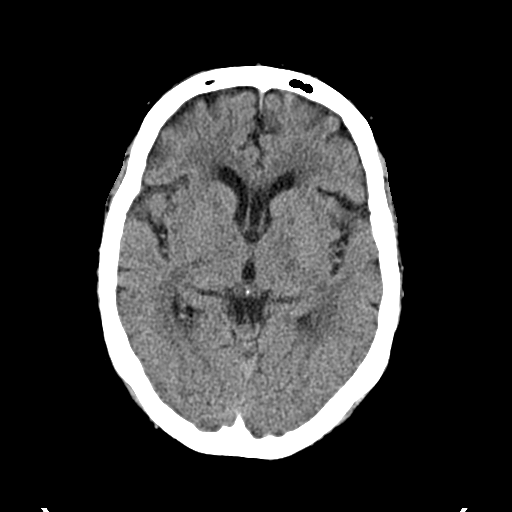
[im 15/32  brain]
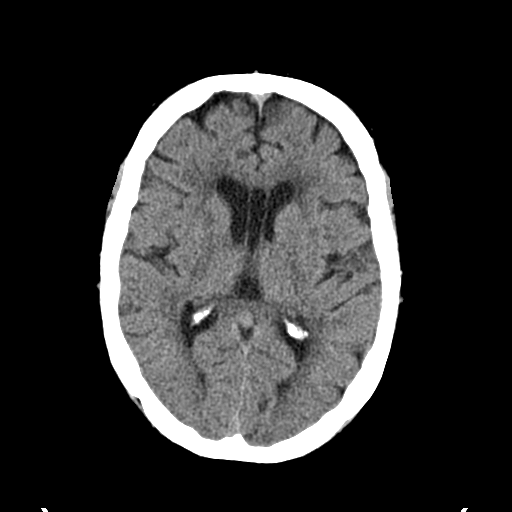
[im 17/32  brain]
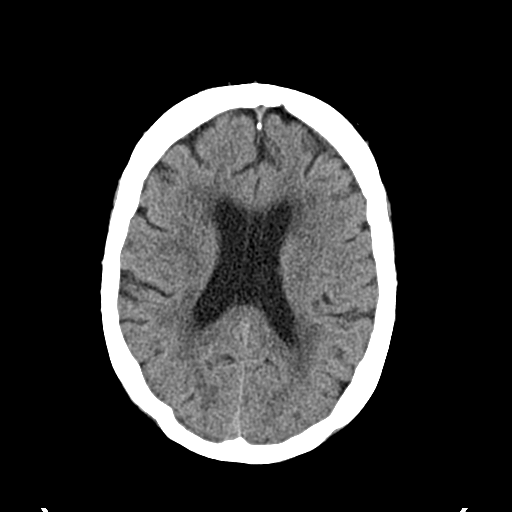
[im 17/32  bone]
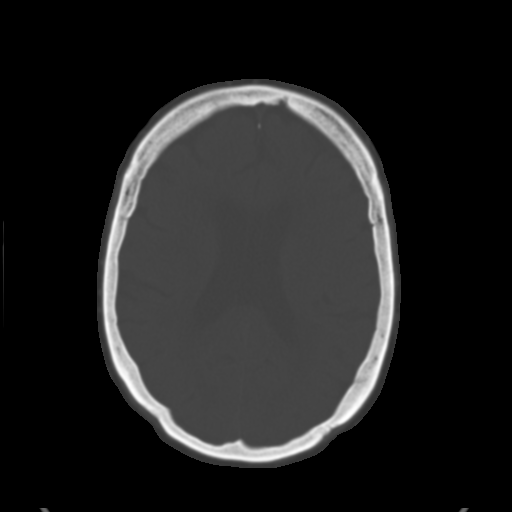
[im 19/32  brain]
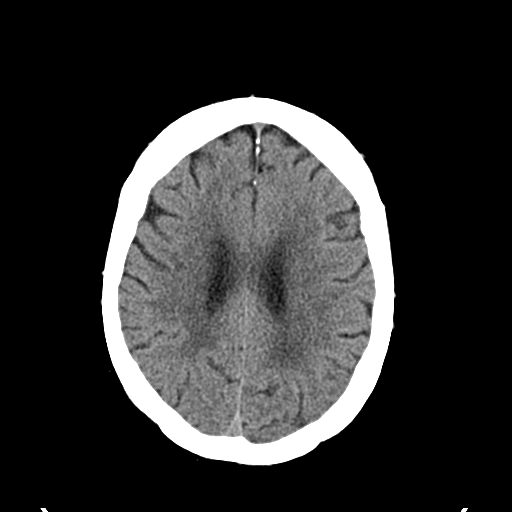
[im 21/32  brain]
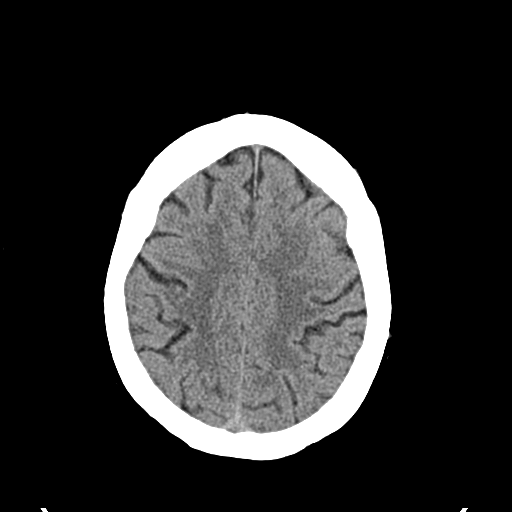
[im 23/32  brain]
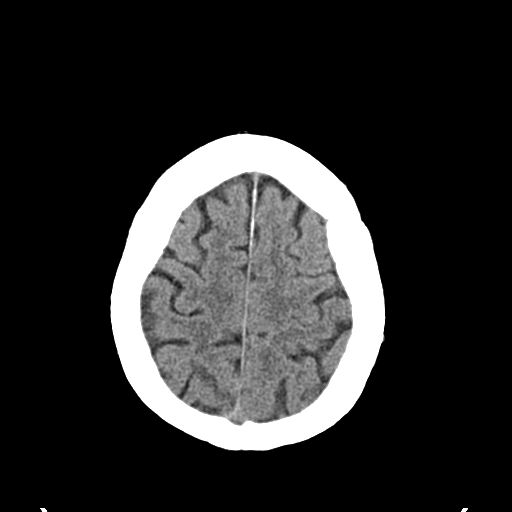
[im 24/32  brain]
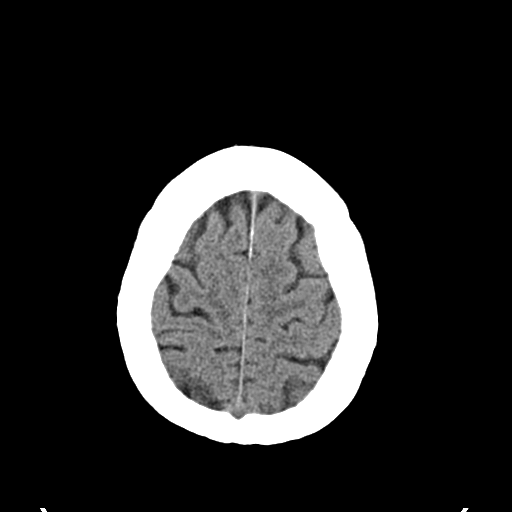
[im 24/32  bone]
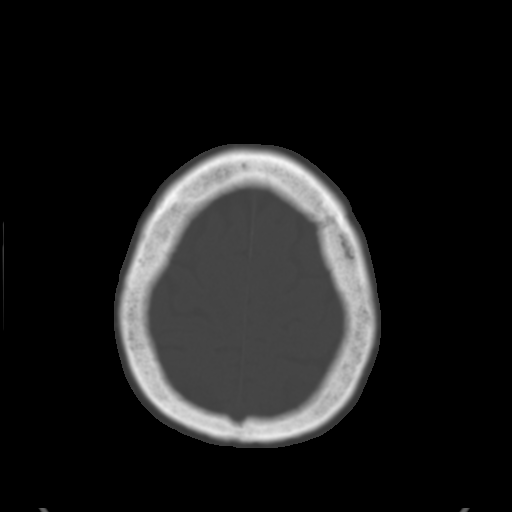
[im 26/32  brain]
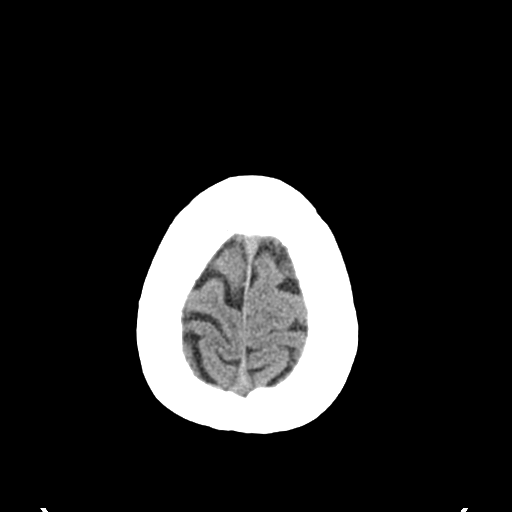
[im 28/32  brain]
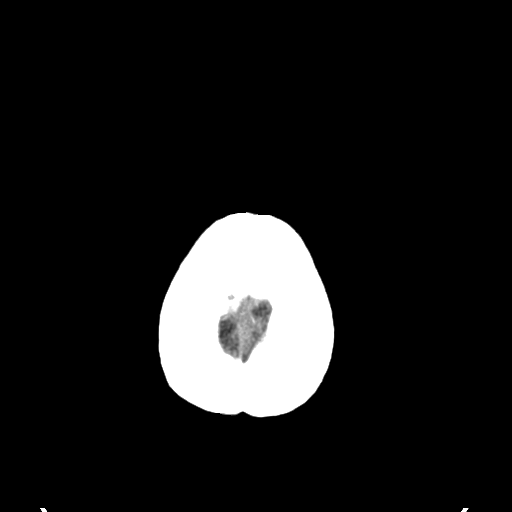
[im 30/32  brain]
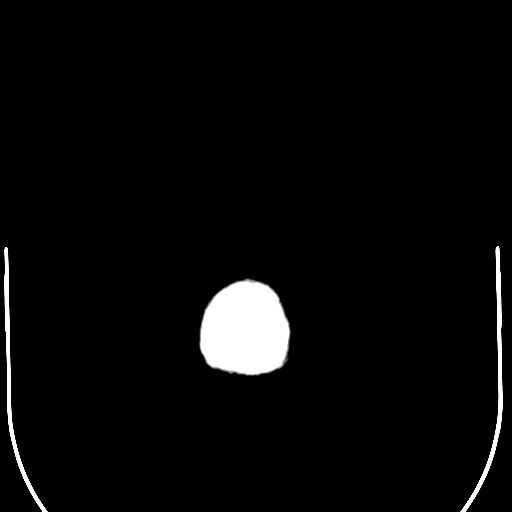

[16 of 30 positions shown; findings below may reference images not displayed]

FINDINGS: No evidence of parenchymal hemorrhage or extra-axial fluid
collection. No mass lesion, mass effect, or midline shift.

No CT evidence of acute infarction.

Subcortical white matter and periventricular small vessel ischemic
changes. Intracranial atherosclerosis.

Cerebral volume is within normal limits.  No ventriculomegaly.

The visualized paranasal sinuses are essentially clear. The mastoid
air cells are unopacified.

No evidence of calvarial fracture.
IMPRESSION: No evidence of acute intracranial abnormality.

Small vessel ischemic changes with intracranial atherosclerosis.

## 2014-11-04 NOTE — H&P (Signed)
Debra Ryan is an 74 y.o. female.    Chief Complaint: right shoulder pain  HPI: Pt is a 74 y.o. female complaining of right shoulder pain for multiple months. Pain had continually increased since the beginning. 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 Pt has tried various conservative treatments which have failed to alleviate their symptoms, including injections and therapy. Various options are discussed with the patient. Risks, benefits and expectations were discussed with the patient. Patient understand the risks, benefits and expectations and wishes to proceed with surgery.   PCP:  Thomes Dinning, MD  D/C Plans:  Home with HHPT  PMH: Past Medical History  Diagnosis Date  . Diabetes mellitus   . Hypertension   . Coronary artery disease   . Colitis   . Neuropathy   . Chronic pain   . GERD (gastroesophageal reflux disease)   . Gastroparesis   . Fibrocystic breast   . Thyroid disease   . Arthritis   . History of blood clots   . High cholesterol   . MI (myocardial infarction)   . DVT (deep venous thrombosis)   . Pulmonary embolism     PSH: Past Surgical History  Procedure Laterality Date  . Abdominal hysterectomy    . Cardiac surgery    . Tonsillectomy    . Cardiac surgery    . Ankle surgery    . Wrist surgery    . Cateract surgery    . Cardiac stents    . Joint replacement      bilateral knees    Social History:  reports that she has never smoked. She does not have any smokeless tobacco history on file. She reports that she does not drink alcohol or use illicit drugs.  Allergies:  Allergies  Allergen Reactions  . Clindamycin/Lincomycin Nausea And Vomiting  . Sulfa Antibiotics Hives    Medications: No current facility-administered medications for this encounter.   Current Outpatient Prescriptions  Medication Sig Dispense Refill  . Armodafinil (NUVIGIL) 250 MG tablet Take 250 mg by mouth daily.    . Ascorbic Acid  (VITAMIN C PO) Take 1 tablet by mouth daily.    Marland Kitchen aspirin 81 MG tablet Take 81 mg by mouth daily.    . Calcium Acetate, Phos Binder, (CALCIUM ACETATE PO) Take 1 tablet by mouth daily.    . Carvedilol (COREG PO) Take 6 mg by mouth.     . diclofenac (FLECTOR) 1.3 % PTCH Place 1 patch onto the skin 2 (two) times daily. Patient uses this on her back.    . furosemide (LASIX) 20 MG tablet Take 20 mg by mouth daily.     Marland Kitchen gabapentin (NEURONTIN) 300 MG capsule Take 300 mg by mouth 3 (three) times daily.    . Insulin Human (INSULIN PUMP) 100 unit/ml SOLN Inject 22.15 each into the skin 4 (four) times daily. Insulin pump:  @ midnight .09 units of insulin, @ 3:30 am 1.0 units of insulin, @ 9 am .80 units of insulin, @ 6 pm 1.05 units of insulin.    Marland Kitchen levothyroxine (SYNTHROID, LEVOTHROID) 112 MCG tablet Take 224 mcg by mouth daily.     Marland Kitchen lidocaine (LIDODERM) 5 % Place 1 patch onto the skin daily. Remove & Discard patch within 12 hours or as directed by MD    . lisinopril (PRINIVIL,ZESTRIL) 20 MG tablet Take 20 mg by mouth daily.    . Mesalamine (ASACOL HD) 800 MG TBEC Take 1 tablet by  mouth daily.    . modafinil (PROVIGIL) 200 MG tablet Take 200 mg by mouth daily.    . Multiple Vitamins-Minerals (MULTIVITAMIN PO) Take 1 tablet by mouth daily.    Marland Kitchen Neomycin-Bacitracin-Polymyxin ([employer] TRIPLE ANTIBIOTIC) OINT Apply 1 application topically 2 (two) times daily. 60 g 0  . omeprazole (PRILOSEC) 40 MG capsule Take 40 mg by mouth daily.    . Ondansetron HCl (ZOFRAN PO) Take 1 tablet by mouth daily as needed. For nausea.    Marland Kitchen oxyCODONE-acetaminophen (PERCOCET) 5-325 MG per tablet Take 1 tablet by mouth every 4 (four) hours as needed. Patient uses this medication for pain.    . Oxymorphone HCl, Crush Resist, (OPANA ER, CRUSH RESISTANT,) 40 MG TB12 Take 1 tablet by mouth 2 times daily at 12 noon and 4 pm.     . polyethylene glycol (MIRALAX / GLYCOLAX) packet Take 17 g by mouth daily.    . Rivaroxaban (XARELTO) 15 MG TABS  tablet Take 15 mg by mouth daily with supper.    . rosuvastatin (CRESTOR) 5 MG tablet Take 5 mg by mouth daily. Patient takes 2.5 mg of this medication every night, except for on Wednesday and Friday.  Then she takes the 5 mg tablet.    . sucralfate (CARAFATE) 1 G tablet Take 1 g by mouth 2 (two) times daily.    . Ticagrelor (BRILINTA PO) Take 90 mg by mouth.    Marland Kitchen tiZANidine (ZANAFLEX) 4 MG tablet Take 4 mg by mouth 3 (three) times daily.    . urea (CARMOL) 40 % CREA Apply 1 application topically daily. Patient applies this medication to her feet.    . vitamin E 100 UNIT capsule Take 100 Units by mouth daily.    Marland Kitchen zinc gluconate 50 MG tablet Take 50 mg by mouth daily.      No results found for this or any previous visit (from the past 48 hour(s)). No results found.  ROS: Pain with rom of the right upper extremity  Physical Exam:  Alert and oriented 74 y.o. 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 Veverly [facility]. Risks benefits and expectations were discussed with the patient. Patient understand risks, benefits and expectations and wishes to proceed.

## 2014-11-06 ENCOUNTER — Inpatient Hospital Stay (HOSPITAL_COMMUNITY)
Admission: RE | Admit: 2014-11-06 | Discharge: 2014-11-06 | Disposition: A | Discharge status: Home / Self Care | Payer: Medicare Other | Source: Ambulatory Visit

## 2014-11-06 ENCOUNTER — Encounter (HOSPITAL_COMMUNITY): Payer: Self-pay

## 2014-11-06 HISTORY — DX: Other muscle spasm: M62.838

## 2014-11-06 HISTORY — DX: Constipation, unspecified: K59.00

## 2014-11-06 HISTORY — DX: Hypothyroidism, unspecified: E03.9

## 2014-11-06 NOTE — Pre-Procedure Instructions (Signed)
Debra Ryan  11/06/2014   Your procedure is scheduled on:  Fri, Jan 15 @ 1:30 PM  Report to [facility]  at 11:30 AM.  Call this number if you have problems the morning of surgery: [phone]   Remember:   Do not eat food or drink liquids after midnight.   Take these medicines the morning of surgery with A SIP OF WATER: Coreg(Carvedilol),Gabapentin(Neurontin),Synthroid(Levothyroxine),Omeprazole(Prilosec),Zofran(Ondansetron-if needed),and Pain Pill(if needed)               Stop taking your Xarelto,Brilinta,and Aspirin as instructed.Also stop taking your Vit E and any other Vitamins or Herbal Medications. No Goody's,BC's,Ibuprofen,Aleve,or Fish Oil.   Do not wear jewelry, make-up or nail polish.  Do not wear lotions, powders, or perfumes.   Do not shave 48 hours prior to surgery.   Do not bring valuables to the hospital.  [facility] is not responsible                  for any belongings or valuables.               Contacts, dentures or bridgework may not be worn into surgery.  Leave suitcase in the car. After surgery it may be brought to your room.  For patients admitted to the hospital, discharge time is determined by your                treatment team.               Patients discharged the day of surgery will not be allowed to drive  home.    Special Instructions:  [facility] - Preparing for Surgery  Before surgery, you can play an important role.  Because skin is not sterile, your skin needs to be as free of germs as possible.  You can reduce the number of germs on you skin by washing with CHG (chlorahexidine gluconate) soap before surgery.  CHG is an antiseptic cleaner which kills germs and bonds with the skin to continue killing germs even after washing.  Please DO NOT use if you have an allergy to CHG or antibacterial soaps.  If your skin becomes reddened/irritated stop using the CHG and inform your nurse when you arrive at Short Stay.  Do not shave (including  legs and underarms) for at least 48 hours prior to the first CHG shower.  You may shave your face.  Please follow these instructions carefully:   1.  Shower with CHG Soap the night before surgery and the                                morning of Surgery.  2.  If you choose to wash your hair, wash your hair first as usual with your       normal shampoo.  3.  After you shampoo, rinse your hair and body thoroughly to remove the                      Shampoo.  4.  Use CHG as you would any other liquid soap.  You can apply chg directly       to the skin and wash gently with scrungie or a clean washcloth.  5.  Apply the CHG Soap to your body ONLY FROM THE NECK DOWN.        Do not use on open wounds or open sores.  Avoid contact with your eyes,       ears, mouth and genitals (private parts).  Wash genitals (private parts)       with your normal soap.  6.  Wash thoroughly, paying special attention to the area where your surgery        will be performed.  7.  Thoroughly rinse your body with warm water from the neck down.  8.  DO NOT shower/wash with your normal soap after using and rinsing off       the CHG Soap.  9.  Pat yourself dry with a clean towel.            10.  Wear clean pajamas.            11.  Place clean sheets on your bed the night of your first shower and do not        sleep with pets.  Day of Surgery  Do not apply any lotions/deoderants the morning of surgery.  Please wear clean clothes to the hospital/surgery center.     Please read over the following fact sheets that you were given: Pain Booklet, Coughing and Deep Breathing and Surgical Site Infection Prevention

## 2014-11-14 ENCOUNTER — Encounter (HOSPITAL_COMMUNITY): Admission: RE | Payer: Self-pay | Source: Ambulatory Visit

## 2014-11-14 ENCOUNTER — Ambulatory Visit (HOSPITAL_COMMUNITY): Admission: RE | Admit: 2014-11-14 | Payer: Medicare Other | Source: Ambulatory Visit | Admitting: Orthopedic Surgery

## 2014-11-14 SURGERY — SHOULDER ARTHROSCOPY WITH SUBACROMIAL DECOMPRESSION AND BICEP TENDON REPAIR
Anesthesia: Choice | Site: Shoulder | Laterality: Right

## 2017-11-02 ENCOUNTER — Encounter (HOSPITAL_BASED_OUTPATIENT_CLINIC_OR_DEPARTMENT_OTHER): Payer: Self-pay | Admitting: *Deleted

## 2017-11-02 ENCOUNTER — Emergency Department (HOSPITAL_BASED_OUTPATIENT_CLINIC_OR_DEPARTMENT_OTHER): Payer: Medicare Other

## 2017-11-02 ENCOUNTER — Other Ambulatory Visit: Payer: Self-pay

## 2017-11-02 ENCOUNTER — Emergency Department (HOSPITAL_BASED_OUTPATIENT_CLINIC_OR_DEPARTMENT_OTHER)
Admission: EM | Admit: 2017-11-02 | Discharge: 2017-11-02 | Disposition: A | Discharge status: Home / Self Care | Payer: Medicare Other | Attending: Emergency Medicine | Admitting: Emergency Medicine

## 2017-11-02 DIAGNOSIS — Z7982 Long term (current) use of aspirin: Secondary | ICD-10-CM | POA: Diagnosis not present

## 2017-11-02 DIAGNOSIS — R739 Hyperglycemia, unspecified: Secondary | ICD-10-CM

## 2017-11-02 DIAGNOSIS — Z87891 Personal history of nicotine dependence: Secondary | ICD-10-CM | POA: Insufficient documentation

## 2017-11-02 DIAGNOSIS — I10 Essential (primary) hypertension: Secondary | ICD-10-CM | POA: Diagnosis not present

## 2017-11-02 DIAGNOSIS — Z794 Long term (current) use of insulin: Secondary | ICD-10-CM | POA: Diagnosis not present

## 2017-11-02 DIAGNOSIS — R110 Nausea: Secondary | ICD-10-CM | POA: Diagnosis present

## 2017-11-02 DIAGNOSIS — E039 Hypothyroidism, unspecified: Secondary | ICD-10-CM | POA: Diagnosis not present

## 2017-11-02 DIAGNOSIS — E1165 Type 2 diabetes mellitus with hyperglycemia: Secondary | ICD-10-CM | POA: Diagnosis not present

## 2017-11-02 DIAGNOSIS — Z79899 Other long term (current) drug therapy: Secondary | ICD-10-CM | POA: Diagnosis not present

## 2017-11-02 DIAGNOSIS — E860 Dehydration: Secondary | ICD-10-CM

## 2017-11-02 DIAGNOSIS — I259 Chronic ischemic heart disease, unspecified: Secondary | ICD-10-CM | POA: Insufficient documentation

## 2017-11-02 LAB — COMPREHENSIVE METABOLIC PANEL WITH GFR
ALT: 19 U/L (ref 14–54)
AST: 27 U/L (ref 15–41)
Albumin: 3.7 g/dL (ref 3.5–5.0)
Alkaline Phosphatase: 84 U/L (ref 38–126)
Anion gap: 11 (ref 5–15)
BUN: 27 mg/dL — ABNORMAL HIGH (ref 6–20)
CO2: 22 mmol/L (ref 22–32)
Calcium: 9 mg/dL (ref 8.9–10.3)
Chloride: 101 mmol/L (ref 101–111)
Creatinine, Ser: 1.26 mg/dL — ABNORMAL HIGH (ref 0.44–1.00)
GFR calc Af Amer: 47 mL/min — ABNORMAL LOW
GFR calc non Af Amer: 41 mL/min — ABNORMAL LOW
Glucose, Bld: 377 mg/dL — ABNORMAL HIGH (ref 65–99)
Potassium: 4.3 mmol/L (ref 3.5–5.1)
Sodium: 134 mmol/L — ABNORMAL LOW (ref 135–145)
Total Bilirubin: 0.8 mg/dL (ref 0.3–1.2)
Total Protein: 6.6 g/dL (ref 6.5–8.1)

## 2017-11-02 LAB — CBC
HCT: 35 % — ABNORMAL LOW (ref 36.0–46.0)
Hemoglobin: 11.4 g/dL — ABNORMAL LOW (ref 12.0–15.0)
MCH: 29.8 pg (ref 26.0–34.0)
MCHC: 32.6 g/dL (ref 30.0–36.0)
MCV: 91.6 fL (ref 78.0–100.0)
PLATELETS: 225 10*3/uL (ref 150–400)
RBC: 3.82 MIL/uL — ABNORMAL LOW (ref 3.87–5.11)
RDW: 12.8 % (ref 11.5–15.5)
WBC: 8.4 10*3/uL (ref 4.0–10.5)

## 2017-11-02 LAB — URINALYSIS, ROUTINE W REFLEX MICROSCOPIC
Bilirubin Urine: NEGATIVE
Glucose, UA: >=500 mg/dL — AB
Hgb urine dipstick: NEGATIVE
Ketones, ur: 15 mg/dL — AB
Leukocytes, UA: NEGATIVE
Nitrite: NEGATIVE
Protein, ur: NEGATIVE mg/dL
Specific Gravity, Urine: 1.015 (ref 1.005–1.030)
pH: 5.5 (ref 5.0–8.0)

## 2017-11-02 LAB — URINALYSIS, MICROSCOPIC (REFLEX)

## 2017-11-02 LAB — CBG MONITORING, ED
Glucose-Capillary: 382 mg/dL — ABNORMAL HIGH (ref 65–99)
Glucose-Capillary: 236 mg/dL — ABNORMAL HIGH (ref 65–99)

## 2017-11-02 MED ORDER — SODIUM CHLORIDE 0.9 % IV BOLUS (SEPSIS)
1000.0000 mL | Freq: Once | INTRAVENOUS | Status: AC
  Administered 2017-11-02: 1000 mL via INTRAVENOUS

## 2017-11-02 MED ORDER — SODIUM CHLORIDE 0.9 % IV BOLUS (SEPSIS): 500.0000 mL | Freq: Once | INTRAVENOUS | Status: DC

## 2017-11-02 NOTE — ED Notes (Signed)
O2 sat 95%

## 2017-11-02 NOTE — ED Provider Notes (Addendum)
[facility] EMERGENCY DEPARTMENT Provider Note   CSN: [id number] Arrival date & time: 11/02/17  1043     History   Chief Complaint Chief Complaint  Patient presents with  . Nausea    HPI Debra Ryan is a 77 y.o. female.  Patient w hx iddm, states blood sugars running high this week, and as high as 600 earlier today. She called her doctor, was advised to change site of insulin pump (which she did) and go to ED for check.  Patient notes 1 episode nausea and vomiting this AM - emesis was not bloody or bilious. No diarrhea.  No recent change in meds or doses. No fevers. No cough, sore throat or uri c/o. No dysuria or gu c/o other than increased frequency. No abd pain.    The history is provided by the patient.    Past Medical History:  Diagnosis Date  . Arthritis   . Chronic pain    takes Opana daily and Percocet daily as needed  . Colitis   . Constipation    takes Carafate and Miralax daily  . Coronary artery disease   . Diabetes mellitus    has an Insulin Pump  . DVT (deep venous thrombosis) ([place])   . Gastroparesis   . GERD (gastroesophageal reflux disease)   . High cholesterol    takes Crestor daily  . History of blood clots    takes Xarelto and Brilinta daily  . Hypertension    takes Coreg and Lisinopril daily  . Hypothyroidism    takes Synthroid daily  . MI (myocardial infarction) ([place])   . Muscle spasm    takes Tizanidine daily  . Neuropathy   . Pulmonary embolism (HCC)     There are no active problems to display for this patient.   Past Surgical History:  Procedure Laterality Date  . ABDOMINAL HYSTERECTOMY    . ANKLE SURGERY    . cardiac stents    . CARDIAC SURGERY    . CARDIAC SURGERY    . cateract surgery    . JOINT REPLACEMENT     bilateral knees  . TONSILLECTOMY    . WRIST SURGERY      OB History    No data available       Home Medications    Prior to Admission medications   Medication Sig Start Date End Date  Taking? Authorizing Provider  Armodafinil (NUVIGIL) 250 MG tablet Take 250 mg by mouth daily.    [provider]  Ascorbic Acid (VITAMIN C PO) Take 1 tablet by mouth daily.    [provider]  aspirin 81 MG tablet Take 81 mg by mouth daily.    [provider]  Calcium Acetate, Phos Binder, (CALCIUM ACETATE PO) Take 1 tablet by mouth daily.    [provider]  Carvedilol (COREG PO) Take 6 mg by mouth.     [provider]  diclofenac (FLECTOR) 1.3 % PTCH Place 1 patch onto the skin 2 (two) times daily. Patient uses this on her back.    [provider]  furosemide (LASIX) 20 MG tablet Take 20 mg by mouth daily.     [provider]  gabapentin (NEURONTIN) 300 MG capsule Take 300 mg by mouth 3 (three) times daily.    [provider]  Insulin Human (INSULIN PUMP) 100 unit/ml SOLN Inject 22.15 each into the skin 4 (four) times daily. Insulin pump:  @ midnight .09 units of insulin, @ 3:30 am  1.0 units of insulin, @ 9 am .80 units of insulin, @ 6 pm 1.05 units of insulin.    [provider]  levothyroxine (SYNTHROID, LEVOTHROID) 112 MCG tablet Take 224 mcg by mouth daily.     [provider]  lidocaine (LIDODERM) 5 % Place 1 patch onto the skin daily. Remove & Discard patch within 12 hours or as directed by MD    [provider]  lisinopril (PRINIVIL,ZESTRIL) 20 MG tablet Take 20 mg by mouth daily.    [provider]  Mesalamine (ASACOL HD) 800 MG TBEC Take 1 tablet by mouth daily.    [provider]  modafinil (PROVIGIL) 200 MG tablet Take 200 mg by mouth daily.    [provider]  Multiple Vitamins-Minerals (MULTIVITAMIN PO) Take 1 tablet by mouth daily.    [provider]  Neomycin-Bacitracin-Polymyxin ([employer] TRIPLE ANTIBIOTIC) OINT Apply 1 application topically 2 (two) times daily. 07/20/13   Pamella Pert, MD  omeprazole (PRILOSEC) 40 MG capsule Take 40 mg by mouth  daily.    [provider]  Ondansetron HCl (ZOFRAN PO) Take 1 tablet by mouth daily as needed. For nausea.    [provider]  oxyCODONE-acetaminophen (PERCOCET) 5-325 MG per tablet Take 1 tablet by mouth every 4 (four) hours as needed. Patient uses this medication for pain.    [provider]  Oxymorphone HCl, Crush Resist, (OPANA ER, CRUSH RESISTANT,) 40 MG TB12 Take 1 tablet by mouth 2 times daily at 12 noon and 4 pm.     [provider]  polyethylene glycol (MIRALAX / GLYCOLAX) packet Take 17 g by mouth daily.    [provider]  Rivaroxaban (XARELTO) 15 MG TABS tablet Take 15 mg by mouth daily with supper.    [provider]  rosuvastatin (CRESTOR) 5 MG tablet Take 5 mg by mouth daily. Patient takes 2.5 mg of this medication every night, except for on Wednesday and Friday.  Then she takes the 5 mg tablet.    [provider]  sucralfate (CARAFATE) 1 G tablet Take 1 g by mouth 2 (two) times daily.    [provider]  Ticagrelor (BRILINTA PO) Take 90 mg by mouth.    [provider]  tiZANidine (ZANAFLEX) 4 MG tablet Take 4 mg by mouth 3 (three) times daily.    [provider]  urea (CARMOL) 40 % CREA Apply 1 application topically daily. Patient applies this medication to her feet.    [provider]  vitamin E 100 UNIT capsule Take 100 Units by mouth daily.    [provider]  zinc gluconate 50 MG tablet Take 50 mg by mouth daily.    [provider]    Family History History reviewed. No pertinent family history.  Social History Social History   Tobacco Use  . Smoking status: Former Research scientist (life sciences)  . Smokeless tobacco: Never Used  Substance Use Topics  . Alcohol use: No  . Drug use: No     Allergies   Clindamycin/lincomycin and Sulfa antibiotics   Review of Systems Review of Systems  Constitutional: Negative for fever.  HENT: Negative for sore throat.   Eyes: Negative  for redness.  Respiratory: Negative for cough and shortness of breath.   Cardiovascular: Negative for chest pain.  Gastrointestinal: Positive for nausea and vomiting. Negative for abdominal pain and diarrhea.  Endocrine: Positive for polyuria.  Genitourinary: Negative for dysuria and flank pain.  Musculoskeletal: Negative for back pain, neck pain  and neck stiffness.  Skin: Negative for rash.  Neurological: Negative for headaches.  Hematological: Does not bruise/bleed easily.  Psychiatric/Behavioral: Negative for confusion.     Physical Exam Updated Vital Signs BP (!) 152/57 (BP Location: Right Arm)   Pulse 75   Temp 97.9 F (36.6 C) (Oral)   SpO2 93%   Physical Exam  Constitutional: She appears well-developed and well-nourished. No distress.  HENT:  Mouth/Throat: Oropharynx is clear and moist.  Eyes: Conjunctivae are normal. No scleral icterus.  Neck: Neck supple. No tracheal deviation present.  No stiffness or rigidity  Cardiovascular: Normal rate, regular rhythm, normal heart sounds and intact distal pulses. Exam reveals no gallop and no friction rub.  No murmur heard. Pulmonary/Chest: Effort normal and breath sounds normal. No respiratory distress.  Abdominal: Soft. Normal appearance and bowel sounds are normal. She exhibits no distension and no mass. There is no tenderness. There is no rebound and no guarding. No hernia.  Genitourinary:  Genitourinary Comments: No cva tenderness  Musculoskeletal: She exhibits no edema.  Neurological: She is alert.  Skin: Skin is warm and dry. No rash noted. She is not diaphoretic.  Psychiatric: She has a normal mood and affect.  Nursing note and vitals reviewed.    ED Treatments / Results  Labs (all labs ordered are listed, but only abnormal results are displayed) Results for orders placed or performed during the hospital encounter of 11/02/17  Comprehensive metabolic panel  Result Value Ref Range   Sodium 134 (L) 135 - 145 mmol/L    Potassium 4.3 3.5 - 5.1 mmol/L   Chloride 101 101 - 111 mmol/L   CO2 22 22 - 32 mmol/L   Glucose, Bld 377 (H) 65 - 99 mg/dL   BUN 27 (H) 6 - 20 mg/dL   Creatinine, Ser 1.26 (H) 0.44 - 1.00 mg/dL   Calcium 9.0 8.9 - 10.3 mg/dL   Total Protein 6.6 6.5 - 8.1 g/dL   Albumin 3.7 3.5 - 5.0 g/dL   AST 27 15 - 41 U/L   ALT 19 14 - 54 U/L   Alkaline Phosphatase 84 38 - 126 U/L   Total Bilirubin 0.8 0.3 - 1.2 mg/dL   GFR calc non Af Amer 41 (L) >60 mL/min   GFR calc Af Amer 47 (L) >60 mL/min   Anion gap 11 5 - 15  CBC  Result Value Ref Range   WBC 8.4 4.0 - 10.5 K/uL   RBC 3.82 (L) 3.87 - 5.11 MIL/uL   Hemoglobin 11.4 (L) 12.0 - 15.0 g/dL   HCT 35.0 (L) 36.0 - 46.0 %   MCV 91.6 78.0 - 100.0 fL   MCH 29.8 26.0 - 34.0 pg   MCHC 32.6 30.0 - 36.0 g/dL   RDW 12.8 11.5 - 15.5 %   Platelets 225 150 - 400 K/uL  Urinalysis, Routine w reflex microscopic  Result Value Ref Range   Color, Urine YELLOW YELLOW   APPearance CLEAR CLEAR   Specific Gravity, Urine 1.015 1.005 - 1.030   pH 5.5 5.0 - 8.0   Glucose, UA >=500 (A) NEGATIVE mg/dL   Hgb urine dipstick NEGATIVE NEGATIVE   Bilirubin Urine NEGATIVE NEGATIVE   Ketones, ur 15 (A) NEGATIVE mg/dL   Protein, ur NEGATIVE NEGATIVE mg/dL   Nitrite NEGATIVE NEGATIVE   Leukocytes, UA NEGATIVE NEGATIVE  Urinalysis, Microscopic (reflex)  Result Value Ref Range   RBC / HPF 0-5 0 - 5 RBC/hpf   WBC, UA 0-5 0 - 5 WBC/hpf  Bacteria, UA RARE (A) NONE SEEN   Squamous Epithelial / LPF 0-5 (A) NONE SEEN   Mucus PRESENT   CBG monitoring, ED  Result Value Ref Range   Glucose-Capillary 382 (H) 65 - 99 mg/dL   Comment 1 Notify RN    Comment 2 Document in Chart   POC CBG, ED  Result Value Ref Range   Glucose-Capillary 236 (H) 65 - 99 mg/dL   Comment 1 Notify RN    Comment 2 Document in Chart    EKG  EKG Interpretation None       Radiology No results found.  Procedures Procedures (including critical care time)  Medications Ordered in  ED Medications - No data to display   Initial Impression / Assessment and Plan / ED Course  I have reviewed the triage vital signs and the nursing notes.  Pertinent labs & imaging results that were available during my care of the patient were reviewed by me and considered in my medical decision making (see chart for details).    Iv ns bolus. Labs.  Reviewed nursing notes and prior charts for additional history.   Additional nv iv bolus.   Glucose much improved on recheck (note that patient had given herself injection of 12 units regular insulin this AM, and bolused additional insulin using pump also prior to arrival to ED - therefore, no additional insulin given in ED) - in response to iv fluids and her insulin prior to arrival, blood sugar is much improved.   No recurrent nausea or vomiting in ED.  Tolerating po fluids.  Afebrile. No pain. abd soft nt. No cp or sob. Pt currently appears stable for d/c.   cbg 236.    Final Clinical Impressions(s) / ED Diagnoses   Final diagnoses:  None    ED Discharge Orders    None           Lajean Saver, MD 11/02/17 1442

## 2017-11-02 NOTE — ED Triage Notes (Signed)
Pt reports "feeling real bad" x Saturday. When asked to be more specific, states she has felt "tired, run down, exhausted, and sick on my stomach." pt reports one episode of vomiting this am, none yesterday. Reports normal bm this am, tested her blood sugar and it was over 600. Took 14 units of regular insulin with syringe, pt has insulin pump also. States she checked her blood sugar en route to er and it was down to 452. Pt states she still "feels so queasy and just awful."

## 2017-11-02 NOTE — ED Notes (Signed)
Pt aware of need for UA; sts she voided previously, but did not have a specimen cup.

## 2017-11-02 NOTE — Discharge Instructions (Signed)
It was our pleasure to provide your ER care today - we hope that you feel better.  Rest. Drink plenty of fluids. Continue insulin and following diabetic eating plan.   Check sugars 4x/day and record values.   Follow up with your doctor in the next few days.  In addition to your diabetes, discuss possible sleep apnea evaluation with your doctor.   Return to ER if worse, new symptoms, fevers, persistent vomiting, new or severe pain, other concern.

## 2017-11-02 NOTE — ED Notes (Signed)
Pt assisted to dress, assisted into w/c. Taken to car by [place], EMT

## 2019-01-11 IMAGING — DX DG CHEST 2V
2 series · 2 of 2 positions shown · non-contrast
Comparison: 10/03/2015

CLINICAL DATA: Fatigue and weakness

EXAM:
CHEST  2 VIEW

[chest pa]
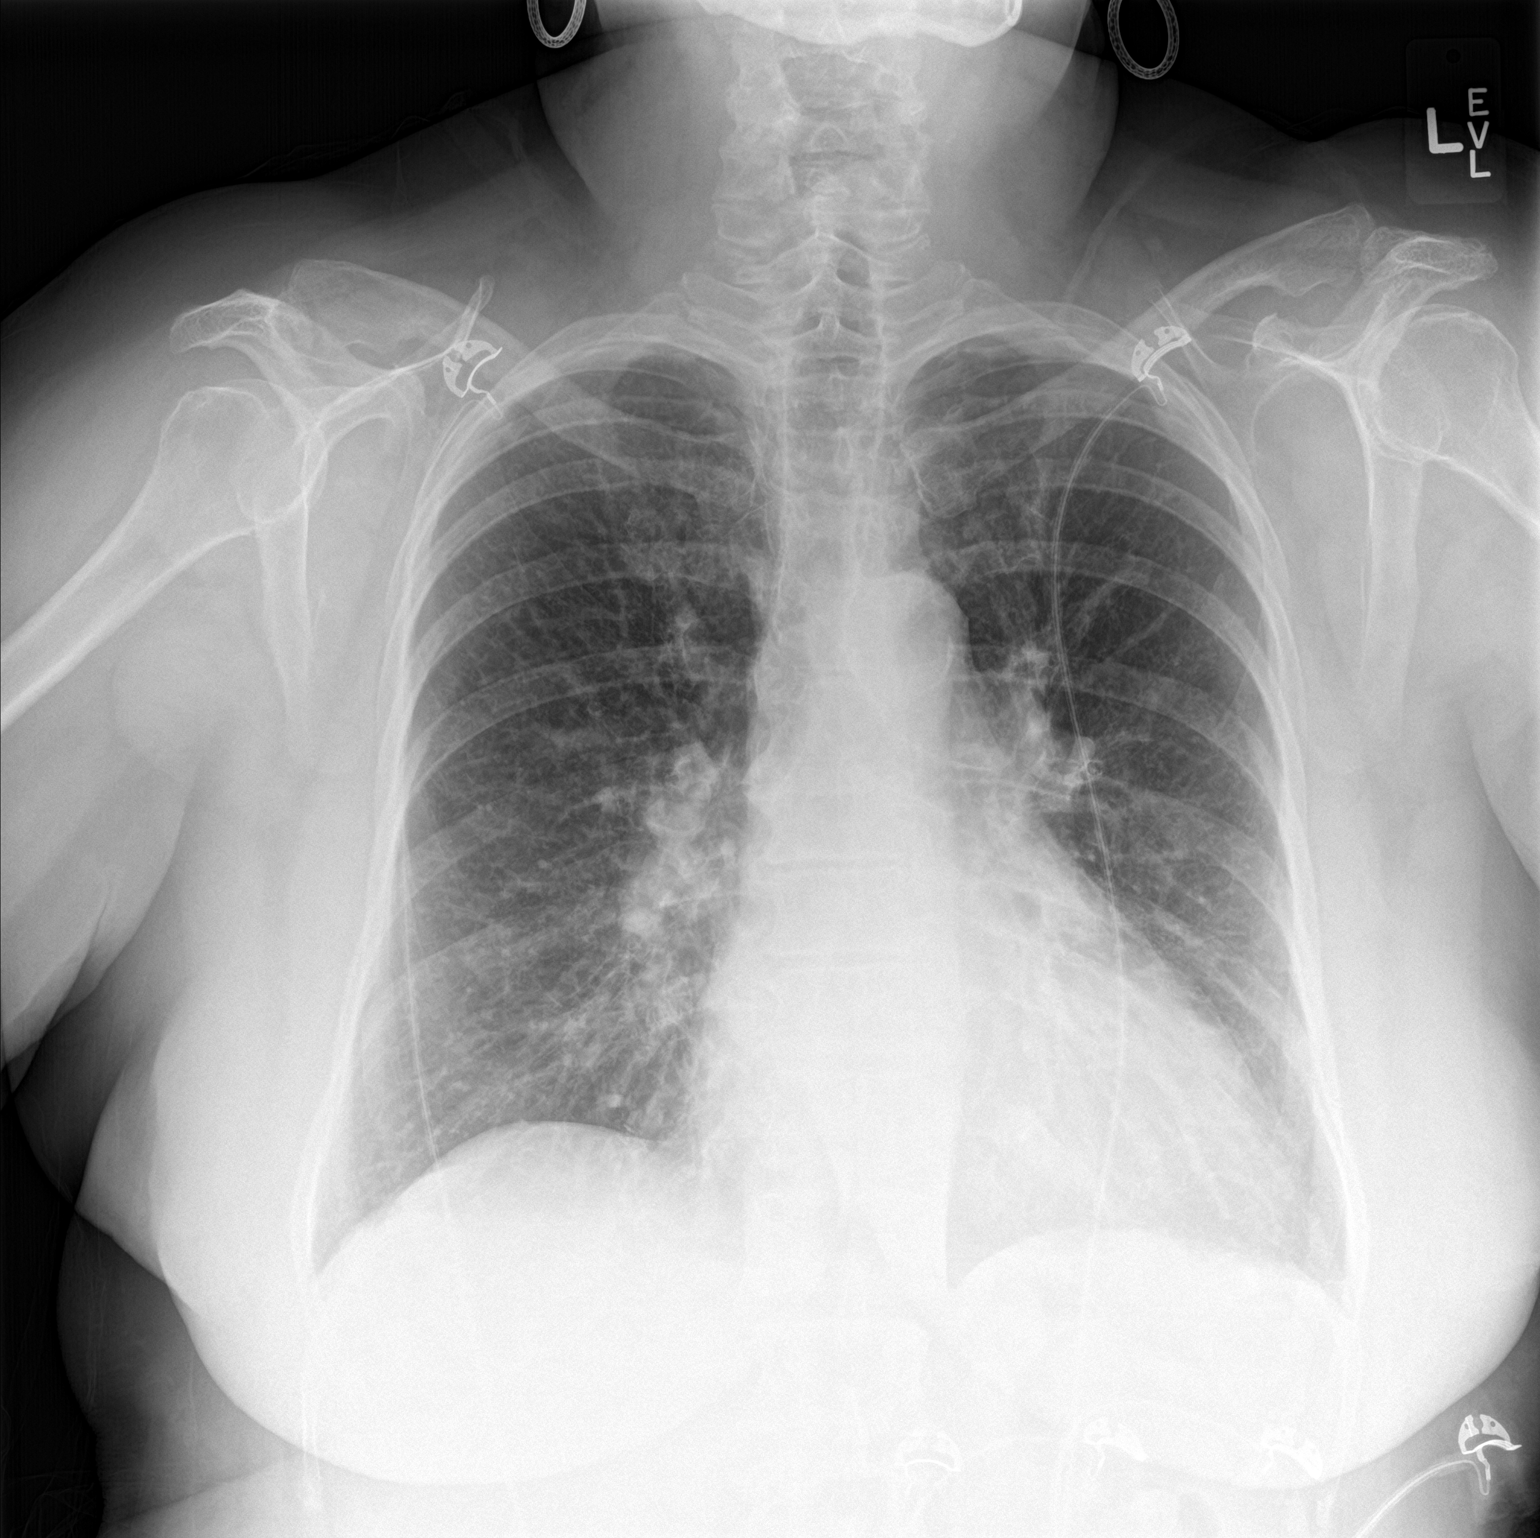

[chest lat]
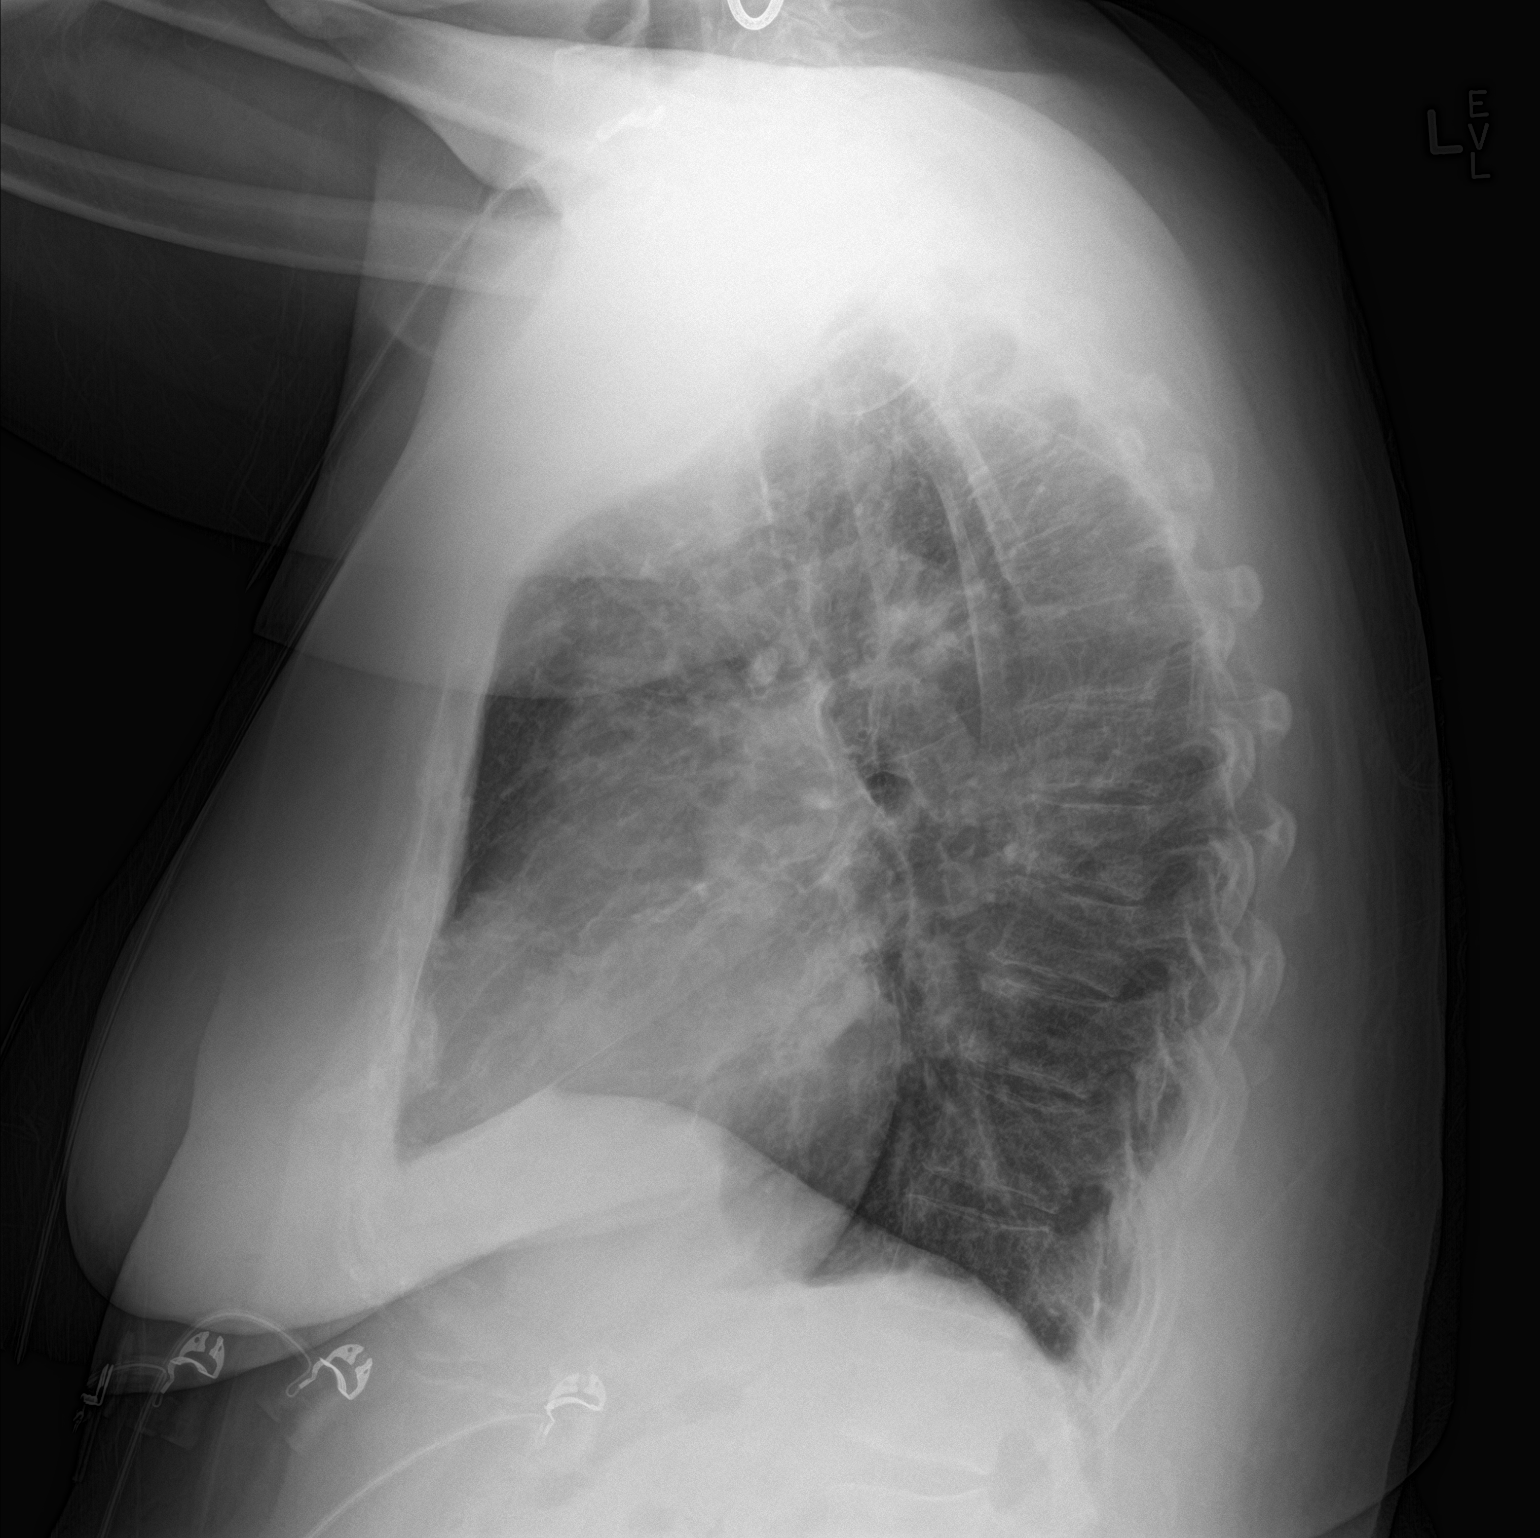

[2 of 2 positions shown; findings below may reference images not displayed]

FINDINGS: Cardiac shadow is stable. Aortic calcifications are again seen.
Lungs are well aerated bilaterally. No focal infiltrate or sizable
effusion is seen. No bony abnormality is noted.
IMPRESSION: No active cardiopulmonary disease.

## 2019-08-09 ENCOUNTER — Encounter (HOSPITAL_BASED_OUTPATIENT_CLINIC_OR_DEPARTMENT_OTHER): Payer: Self-pay

## 2019-08-09 ENCOUNTER — Emergency Department (HOSPITAL_BASED_OUTPATIENT_CLINIC_OR_DEPARTMENT_OTHER): Payer: Medicare Other

## 2019-08-09 ENCOUNTER — Emergency Department (HOSPITAL_BASED_OUTPATIENT_CLINIC_OR_DEPARTMENT_OTHER)
Admission: EM | Admit: 2019-08-09 | Discharge: 2019-08-09 | Disposition: A | Discharge status: Home / Self Care | Payer: Medicare Other | Attending: Emergency Medicine | Admitting: Emergency Medicine

## 2019-08-09 ENCOUNTER — Other Ambulatory Visit: Payer: Self-pay

## 2019-08-09 DIAGNOSIS — I2510 Atherosclerotic heart disease of native coronary artery without angina pectoris: Secondary | ICD-10-CM | POA: Diagnosis not present

## 2019-08-09 DIAGNOSIS — I10 Essential (primary) hypertension: Secondary | ICD-10-CM | POA: Diagnosis not present

## 2019-08-09 DIAGNOSIS — Z20828 Contact with and (suspected) exposure to other viral communicable diseases: Secondary | ICD-10-CM | POA: Insufficient documentation

## 2019-08-09 DIAGNOSIS — Z794 Long term (current) use of insulin: Secondary | ICD-10-CM | POA: Insufficient documentation

## 2019-08-09 DIAGNOSIS — Z79899 Other long term (current) drug therapy: Secondary | ICD-10-CM | POA: Insufficient documentation

## 2019-08-09 DIAGNOSIS — Z87891 Personal history of nicotine dependence: Secondary | ICD-10-CM | POA: Diagnosis not present

## 2019-08-09 DIAGNOSIS — E119 Type 2 diabetes mellitus without complications: Secondary | ICD-10-CM | POA: Insufficient documentation

## 2019-08-09 DIAGNOSIS — J189 Pneumonia, unspecified organism: Secondary | ICD-10-CM

## 2019-08-09 DIAGNOSIS — R112 Nausea with vomiting, unspecified: Secondary | ICD-10-CM

## 2019-08-09 DIAGNOSIS — Z7901 Long term (current) use of anticoagulants: Secondary | ICD-10-CM | POA: Diagnosis not present

## 2019-08-09 DIAGNOSIS — Z96653 Presence of artificial knee joint, bilateral: Secondary | ICD-10-CM | POA: Diagnosis not present

## 2019-08-09 DIAGNOSIS — E039 Hypothyroidism, unspecified: Secondary | ICD-10-CM | POA: Insufficient documentation

## 2019-08-09 LAB — CBC WITH DIFFERENTIAL/PLATELET
Abs Immature Granulocytes: 0.02 10*3/uL (ref 0.00–0.07)
Basophils Absolute: 0.1 10*3/uL (ref 0.0–0.1)
Basophils Relative: 1 %
Eosinophils Absolute: 0 10*3/uL (ref 0.0–0.5)
Eosinophils Relative: 0 %
HCT: 38.3 % (ref 36.0–46.0)
Hemoglobin: 12.1 g/dL (ref 12.0–15.0)
Immature Granulocytes: 0 %
Lymphocytes Relative: 17 %
Lymphs Abs: 1.4 10*3/uL (ref 0.7–4.0)
MCH: 29.6 pg (ref 26.0–34.0)
MCHC: 31.6 g/dL (ref 30.0–36.0)
MCV: 93.6 fL (ref 80.0–100.0)
Monocytes Absolute: 0.8 10*3/uL (ref 0.1–1.0)
Monocytes Relative: 10 %
Neutro Abs: 5.8 10*3/uL (ref 1.7–7.7)
Neutrophils Relative %: 72 %
Platelets: 278 10*3/uL (ref 150–400)
RBC: 4.09 MIL/uL (ref 3.87–5.11)
RDW: 13.2 % (ref 11.5–15.5)
WBC: 8.1 10*3/uL (ref 4.0–10.5)
nRBC: 0 % (ref 0.0–0.2)

## 2019-08-09 LAB — POCT I-STAT EG7
Acid-Base Excess: 5 mmol/L — ABNORMAL HIGH (ref 0.0–2.0)
Bicarbonate: 28.5 mmol/L — ABNORMAL HIGH (ref 20.0–28.0)
Calcium, Ion: 1.21 mmol/L (ref 1.15–1.40)
HCT: 41 % (ref 36.0–46.0)
Hemoglobin: 13.9 g/dL (ref 12.0–15.0)
O2 Saturation: 71 %
Patient temperature: 98.6
Potassium: 4.4 mmol/L (ref 3.5–5.1)
Sodium: 139 mmol/L (ref 135–145)
TCO2: 30 mmol/L (ref 22–32)
pCO2, Ven: 37.4 mmHg — ABNORMAL LOW (ref 44.0–60.0)
pH, Ven: 7.489 — ABNORMAL HIGH (ref 7.250–7.430)
pO2, Ven: 34 mmHg (ref 32.0–45.0)

## 2019-08-09 LAB — COMPREHENSIVE METABOLIC PANEL
ALT: 20 U/L (ref 0–44)
AST: 25 U/L (ref 15–41)
Albumin: 3.9 g/dL (ref 3.5–5.0)
Alkaline Phosphatase: 96 U/L (ref 38–126)
Anion gap: 11 (ref 5–15)
BUN: 14 mg/dL (ref 8–23)
CO2: 26 mmol/L (ref 22–32)
Calcium: 9.4 mg/dL (ref 8.9–10.3)
Chloride: 101 mmol/L (ref 98–111)
Creatinine, Ser: 1.15 mg/dL — ABNORMAL HIGH (ref 0.44–1.00)
GFR calc Af Amer: 54 mL/min — ABNORMAL LOW (ref >60)
GFR calc non Af Amer: 46 mL/min — ABNORMAL LOW (ref >60)
Glucose, Bld: 123 mg/dL — ABNORMAL HIGH (ref 70–99)
Potassium: 3.8 mmol/L (ref 3.5–5.1)
Sodium: 138 mmol/L (ref 135–145)
Total Bilirubin: 0.6 mg/dL (ref 0.3–1.2)
Total Protein: 6.9 g/dL (ref 6.5–8.1)

## 2019-08-09 LAB — LIPASE, BLOOD: Lipase: 16 U/L (ref 11–51)

## 2019-08-09 LAB — URINALYSIS, MICROSCOPIC (REFLEX): RBC / HPF: NONE SEEN RBC/hpf (ref 0–5)

## 2019-08-09 LAB — URINALYSIS, ROUTINE W REFLEX MICROSCOPIC
Glucose, UA: NEGATIVE mg/dL
Hgb urine dipstick: NEGATIVE
Ketones, ur: 15 mg/dL — AB
Leukocytes,Ua: NEGATIVE
Nitrite: NEGATIVE
Protein, ur: 100 mg/dL — AB
Specific Gravity, Urine: >1.03 — ABNORMAL HIGH (ref 1.005–1.030)
pH: 5.5 (ref 5.0–8.0)

## 2019-08-09 MED ORDER — PREDNISONE 20 MG PO TABS: 40.0000 mg | ORAL_TABLET | Freq: Every day | ORAL | 0 refills | Status: AC

## 2019-08-09 MED ORDER — SODIUM CHLORIDE 0.9 % IV BOLUS
500.0000 mL | Freq: Once | INTRAVENOUS | Status: AC
  Administered 2019-08-09: 21:00:00 500 mL via INTRAVENOUS

## 2019-08-09 MED ORDER — ONDANSETRON HCL 4 MG/2ML IJ SOLN: 4.0000 mg | Freq: Once | INTRAMUSCULAR | Status: DC | PRN

## 2019-08-09 MED ORDER — PROMETHAZINE HCL 12.5 MG PO TABS: 12.5000 mg | ORAL_TABLET | Freq: Four times a day (QID) | ORAL | 0 refills | Status: DC | PRN

## 2019-08-09 MED ORDER — DOXYCYCLINE HYCLATE 100 MG PO TABS
100.0000 mg | ORAL_TABLET | Freq: Once | ORAL | Status: AC
  Administered 2019-08-09: 100 mg via ORAL
  Filled 2019-08-09: qty 1

## 2019-08-09 MED ORDER — DOXYCYCLINE HYCLATE 100 MG PO CAPS: 100.0000 mg | ORAL_CAPSULE | Freq: Two times a day (BID) | ORAL | 0 refills | Status: AC

## 2019-08-09 NOTE — ED Provider Notes (Signed)
Emergency Department Provider Note   I have reviewed the triage vital signs and the nursing notes.   HISTORY  Chief Complaint Vomiting   HPI Debra Ryan is a 77 y.o. female with past medical history of insulin-dependent diabetes presents to the emergency department with nausea and vomiting over the past 3 days.  She describes feeling the need to take deep, frequent breaths as well.  Denies chest pain.  No diarrhea.  No fevers.  She reports going to urgent care yesterday.  She states she tested negative for COVID-19 in the flu.  She did not have evidence of a UTI.  She did have some ketones in the urine according to the patient.  She remains compliant with her insulin.  She states that her ketones today have been moderate when she tested at home.  She has not had vomiting today but persistent, severe nausea.  Denies focal abdominal pain, back pain.  No UTI symptoms.  No sore throat.    Past Medical History:  Diagnosis Date  . Arthritis   . Chronic pain    takes Opana daily and Percocet daily as needed  . Colitis   . Constipation    takes Carafate and Miralax daily  . Coronary artery disease   . Diabetes mellitus    has an Insulin Pump  . DVT (deep venous thrombosis) ([place])   . Gastroparesis   . GERD (gastroesophageal reflux disease)   . High cholesterol    takes Crestor daily  . History of blood clots    takes Xarelto and Brilinta daily  . Hypertension    takes Coreg and Lisinopril daily  . Hypothyroidism    takes Synthroid daily  . MI (myocardial infarction) ([place])   . Muscle spasm    takes Tizanidine daily  . Neuropathy   . Pulmonary embolism (HCC)     There are no active problems to display for this patient.   Past Surgical History:  Procedure Laterality Date  . ABDOMINAL HYSTERECTOMY    . ANKLE SURGERY    . cardiac stents    . CARDIAC SURGERY    . CARDIAC SURGERY    . cateract surgery    . JOINT REPLACEMENT     bilateral knees  . TONSILLECTOMY     . WRIST SURGERY      Allergies Clindamycin/lincomycin and Sulfa antibiotics  No family history on file.  Social History Social History   Tobacco Use  . Smoking status: Former Research scientist (life sciences)  . Smokeless tobacco: Never Used  Substance Use Topics  . Alcohol use: No  . Drug use: No    Review of Systems  Constitutional: No fever/chills. Positive generalized weakness.  Eyes: No visual changes. ENT: No sore throat. Cardiovascular: Denies chest pain. Respiratory: Denies shortness of breath. Gastrointestinal: No abdominal pain. Positive nausea and vomiting.  No diarrhea.  No constipation. Genitourinary: Negative for dysuria. Musculoskeletal: Negative for back pain. Skin: Negative for rash. Neurological: Negative for headaches, focal weakness or numbness.  10-point ROS otherwise negative.  ____________________________________________   PHYSICAL EXAM:  VITAL SIGNS: ED Triage Vitals  Enc Vitals Group     BP 08/09/19 1906 (!) 170/67     Pulse Rate 08/09/19 1906 77     Resp 08/09/19 1906 20     Temp 08/09/19 1906 98.6 F (37 C)     Temp Source 08/09/19 1906 Oral     SpO2 08/09/19 1906 99 %     Weight 08/09/19 1906 (!) 320 lb (  145.2 kg)     Height 08/09/19 1906 5\' 3"  (1.6 m)   Constitutional: Alert and oriented. Well appearing and in no acute distress. Eyes: Conjunctivae are normal.  Head: Atraumatic. Nose: No congestion/rhinnorhea. Mouth/Throat: Mucous membranes are moist. Neck: No stridor.   Cardiovascular: Normal rate, regular rhythm. Good peripheral circulation. Grossly normal heart sounds.   Respiratory: Normal respiratory effort.  No retractions. Lungs CTAB. Gastrointestinal: No distention.  Musculoskeletal: No lower extremity tenderness nor edema. No gross deformities of extremities. Neurologic:  Normal speech and language. No gross focal neurologic deficits are appreciated.  Skin:  Skin is warm, dry and intact. No rash noted.   ____________________________________________   LABS (all labs ordered are listed, but only abnormal results are displayed)  Labs Reviewed  COMPREHENSIVE METABOLIC PANEL - Abnormal; Notable for the following components:      Result Value   Glucose, Bld 123 (*)    Creatinine, Ser 1.15 (*)    GFR calc non Af Amer 46 (*)    GFR calc Af Amer 54 (*)    All other components within normal limits  URINALYSIS, ROUTINE W REFLEX MICROSCOPIC - Abnormal; Notable for the following components:   APPearance HAZY (*)    Specific Gravity, Urine >1.030 (*)    Bilirubin Urine SMALL (*)    Ketones, ur 15 (*)    Protein, ur 100 (*)    All other components within normal limits  URINALYSIS, MICROSCOPIC (REFLEX) - Abnormal; Notable for the following components:   Bacteria, UA RARE (*)    All other components within normal limits  POCT I-STAT EG7 - Abnormal; Notable for the following components:   pH, Ven 7.489 (*)    pCO2, Ven 37.4 (*)    Bicarbonate 28.5 (*)    Acid-Base Excess 5.0 (*)    All other components within normal limits  SARS CORONAVIRUS 2 (TAT 6-24 HRS)  LIPASE, BLOOD  CBC WITH DIFFERENTIAL/PLATELET  I-STAT VENOUS BLOOD GAS, ED   ____________________________________________  RADIOLOGY  Dg Chest Portable 1 View  Result Date: 08/09/2019 CLINICAL DATA:  Shortness of breath EXAM: PORTABLE CHEST 1 VIEW COMPARISON:  Radiograph 08/31/2018 FINDINGS: Right infrahilar and retrocardiac consolidative opacities with few air bronchograms in the right lung base. No visible pneumothorax or effusion. The aorta is calcified. The remaining cardiomediastinal contours are unremarkable. Degenerative changes are present in the imaged spine and shoulders. No acute osseous or soft tissue abnormality. IMPRESSION: Right infrahilar and retrocardiac consolidative opacities, could reflect a multifocal pneumonia in the appropriate clinical context. Electronically Signed   By: Lovena Le M.D.   On: 08/09/2019 22:55     ____________________________________________   PROCEDURES  Procedure(s) performed:   Procedures  None  ____________________________________________   INITIAL IMPRESSION / ASSESSMENT AND PLAN / ED COURSE  Pertinent labs & imaging results that were available during my care of the patient were reviewed by me and considered in my medical decision making (see chart for details).   Patient presents the emergency department for evaluation of nausea and vomiting.  She is insulin-dependent diabetic with report of ketones in the urine yesterday.  My suspicion for DKA is elevated.  She has no focal abdominal tenderness to increase my suspicion for intra-abdominal surgical process.  Plan for screening labs including VBG.  Will obtain chest x-ray given her report of deep, frequent breathing and slight SOB sensation.   Chest x-ray shows concern for multifocal pneumonia.  No evidence of DKA on lab work.  No other acute findings.  Patient  without hypoxemia or significant respiratory effort.  Suspect COVID-19 clinically.  Will start antibiotics at home pending COVID testing.  Patient to follow results on her phone and return to the emergency department with any new or suddenly worsening symptoms.  Discussed outpatient telemedicine follow-up in the coming week. Husband to quarantine as well and discuss with his PCP on Monday regarding outpatient COVID-19 testing. ____________________________________________  FINAL CLINICAL IMPRESSION(S) / ED DIAGNOSES  Final diagnoses:  Multifocal pneumonia  Non-intractable vomiting with nausea, unspecified vomiting type     MEDICATIONS GIVEN DURING THIS VISIT:  Medications  sodium chloride 0.9 % bolus 500 mL ( Intravenous Stopped 08/09/19 2151)  doxycycline (VIBRA-TABS) tablet 100 mg (100 mg Oral Given 08/09/19 2316)     NEW OUTPATIENT MEDICATIONS STARTED DURING THIS VISIT:  Discharge Medication List as of 08/09/2019 11:06 PM    START taking these  medications   Details  doxycycline (VIBRAMYCIN) 100 MG capsule Take 1 capsule (100 mg total) by mouth 2 (two) times daily for 7 days., Starting Fri 08/09/2019, Until Fri 08/16/2019, Print    predniSONE (DELTASONE) 20 MG tablet Take 2 tablets (40 mg total) by mouth daily for 5 days., Starting Fri 08/09/2019, Until Wed 08/14/2019, Print    promethazine (PHENERGAN) 12.5 MG tablet Take 1 tablet (12.5 mg total) by mouth every 6 (six) hours as needed for up to 20 days for nausea or vomiting., Starting Fri 08/09/2019, Until Thu 08/29/2019, Print        Note:  This document was prepared using Dragon voice recognition software and may include unintentional dictation errors.  Nanda Quinton, MD,  [facility]    Avrielle Fry, [facility], MD 08/10/19 [phone]

## 2019-08-09 NOTE — ED Triage Notes (Signed)
Pt c/o n/v x 3 days-none today-denies diarrhea-[place] the beach and went to [place] yesterday-was advised she" did not have a UTI slightly dehydrating and spilling ketones"-neg covid test-NAD-to triage in w/c

## 2019-08-09 NOTE — ED Notes (Signed)
ED Provider at bedside. 

## 2019-08-09 NOTE — Discharge Instructions (Signed)
He was seen in the emergency department today with nausea and vomiting.  Your chest x-ray shows concern for pneumonia.  Not clear if this is a bacterial pneumonia or a virus.  This could very well be COVID-19.  I am sending a test and need you to isolate at home until you get the test results and are feeling better for at least 10 days.  Please take the antibiotic and steroid provided.  If you develop worsening shortness of breath, vomiting, confusion, or other severe symptoms you will need to return to the emergency department immediately.

## 2019-08-10 LAB — SARS CORONAVIRUS 2 (TAT 6-24 HRS): SARS Coronavirus 2: NEGATIVE

## 2021-12-15 ENCOUNTER — Other Ambulatory Visit: Payer: Self-pay | Admitting: Internal Medicine

## 2021-12-15 ENCOUNTER — Other Ambulatory Visit: Payer: Self-pay

## 2021-12-15 ENCOUNTER — Ambulatory Visit
Admission: RE | Admit: 2021-12-15 | Discharge: 2021-12-15 | Disposition: A | Discharge status: Home / Self Care | Payer: Medicare Other | Source: Ambulatory Visit | Attending: Internal Medicine | Admitting: Internal Medicine

## 2021-12-15 DIAGNOSIS — M542 Cervicalgia: Secondary | ICD-10-CM

## 2021-12-15 DIAGNOSIS — M25512 Pain in left shoulder: Secondary | ICD-10-CM

## 2021-12-15 DIAGNOSIS — M79602 Pain in left arm: Secondary | ICD-10-CM

## 2021-12-23 ENCOUNTER — Other Ambulatory Visit: Payer: Self-pay | Admitting: Internal Medicine

## 2021-12-23 DIAGNOSIS — M25512 Pain in left shoulder: Secondary | ICD-10-CM

## 2021-12-23 DIAGNOSIS — R52 Pain, unspecified: Secondary | ICD-10-CM

## 2022-01-05 ENCOUNTER — Other Ambulatory Visit: Payer: Medicare Other

## 2022-06-06 ENCOUNTER — Encounter: Payer: Self-pay | Admitting: *Deleted

## 2022-06-06 ENCOUNTER — Telehealth: Payer: Self-pay | Admitting: Hematology

## 2022-06-06 ENCOUNTER — Ambulatory Visit
Admission: RE | Admit: 2022-06-06 | Discharge: 2022-06-06 | Disposition: A | Discharge status: Home / Self Care | Payer: Self-pay | Source: Ambulatory Visit | Attending: Hematology | Admitting: Hematology

## 2022-06-06 DIAGNOSIS — C241 Malignant neoplasm of ampulla of Vater: Secondary | ICD-10-CM

## 2022-06-06 NOTE — Telephone Encounter (Signed)
Scheduled appt per 8/7 referral. Pt is aware of appt date and time. Pt is aware to arrive 15 mins prior to appt time and to bring and updated insurance card. Pt is aware of appt location.   

## 2022-06-07 ENCOUNTER — Other Ambulatory Visit: Payer: Self-pay

## 2022-06-07 ENCOUNTER — Ambulatory Visit
Admission: RE | Admit: 2022-06-07 | Discharge: 2022-06-07 | Disposition: A | Discharge status: Home / Self Care | Payer: Self-pay | Source: Ambulatory Visit | Attending: Hematology | Admitting: Hematology

## 2022-06-07 DIAGNOSIS — C241 Malignant neoplasm of ampulla of Vater: Secondary | ICD-10-CM

## 2022-06-15 ENCOUNTER — Inpatient Hospital Stay: Payer: Medicare Other | Attending: Hematology | Admitting: Hematology

## 2022-06-15 DIAGNOSIS — Z86711 Personal history of pulmonary embolism: Secondary | ICD-10-CM | POA: Insufficient documentation

## 2022-06-15 DIAGNOSIS — I10 Essential (primary) hypertension: Secondary | ICD-10-CM | POA: Insufficient documentation

## 2022-06-15 DIAGNOSIS — Z7901 Long term (current) use of anticoagulants: Secondary | ICD-10-CM | POA: Insufficient documentation

## 2022-06-15 DIAGNOSIS — Z87891 Personal history of nicotine dependence: Secondary | ICD-10-CM | POA: Insufficient documentation

## 2022-06-15 DIAGNOSIS — Z8042 Family history of malignant neoplasm of prostate: Secondary | ICD-10-CM | POA: Insufficient documentation

## 2022-06-15 DIAGNOSIS — C241 Malignant neoplasm of ampulla of Vater: Secondary | ICD-10-CM | POA: Insufficient documentation

## 2022-06-15 DIAGNOSIS — Z803 Family history of malignant neoplasm of breast: Secondary | ICD-10-CM | POA: Insufficient documentation

## 2022-06-15 DIAGNOSIS — Z86718 Personal history of other venous thrombosis and embolism: Secondary | ICD-10-CM | POA: Insufficient documentation

## 2022-06-15 DIAGNOSIS — Z881 Allergy status to other antibiotic agents status: Secondary | ICD-10-CM | POA: Insufficient documentation

## 2022-06-15 DIAGNOSIS — Z79899 Other long term (current) drug therapy: Secondary | ICD-10-CM | POA: Insufficient documentation

## 2022-06-15 DIAGNOSIS — Z79890 Hormone replacement therapy (postmenopausal): Secondary | ICD-10-CM | POA: Insufficient documentation

## 2022-06-15 DIAGNOSIS — E119 Type 2 diabetes mellitus without complications: Secondary | ICD-10-CM | POA: Insufficient documentation

## 2022-06-15 DIAGNOSIS — R7989 Other specified abnormal findings of blood chemistry: Secondary | ICD-10-CM | POA: Insufficient documentation

## 2022-06-15 DIAGNOSIS — Z882 Allergy status to sulfonamides status: Secondary | ICD-10-CM | POA: Insufficient documentation

## 2022-06-15 DIAGNOSIS — M1990 Unspecified osteoarthritis, unspecified site: Secondary | ICD-10-CM | POA: Insufficient documentation

## 2022-06-15 DIAGNOSIS — R918 Other nonspecific abnormal finding of lung field: Secondary | ICD-10-CM | POA: Insufficient documentation

## 2022-06-15 DIAGNOSIS — Z90710 Acquired absence of both cervix and uterus: Secondary | ICD-10-CM | POA: Insufficient documentation

## 2022-06-20 ENCOUNTER — Telehealth: Payer: Self-pay | Admitting: Hematology

## 2022-06-20 NOTE — Telephone Encounter (Signed)
R/s pt's missed new pt appt with Dr. Burr Medico. Spoke to pt who is aware of new appt date/time. She is aware to arrive 30 mins prior to appt for check in.

## 2022-06-30 ENCOUNTER — Encounter: Payer: Self-pay | Admitting: Hematology

## 2022-06-30 ENCOUNTER — Other Ambulatory Visit: Payer: Self-pay

## 2022-06-30 ENCOUNTER — Inpatient Hospital Stay (HOSPITAL_BASED_OUTPATIENT_CLINIC_OR_DEPARTMENT_OTHER): Payer: Medicare Other | Admitting: Hematology

## 2022-06-30 DIAGNOSIS — Z8042 Family history of malignant neoplasm of prostate: Secondary | ICD-10-CM | POA: Diagnosis not present

## 2022-06-30 DIAGNOSIS — Z881 Allergy status to other antibiotic agents status: Secondary | ICD-10-CM | POA: Diagnosis not present

## 2022-06-30 DIAGNOSIS — Z882 Allergy status to sulfonamides status: Secondary | ICD-10-CM

## 2022-06-30 DIAGNOSIS — R918 Other nonspecific abnormal finding of lung field: Secondary | ICD-10-CM | POA: Diagnosis not present

## 2022-06-30 DIAGNOSIS — Z79899 Other long term (current) drug therapy: Secondary | ICD-10-CM

## 2022-06-30 DIAGNOSIS — R7989 Other specified abnormal findings of blood chemistry: Secondary | ICD-10-CM | POA: Diagnosis not present

## 2022-06-30 DIAGNOSIS — Z7901 Long term (current) use of anticoagulants: Secondary | ICD-10-CM | POA: Diagnosis not present

## 2022-06-30 DIAGNOSIS — I10 Essential (primary) hypertension: Secondary | ICD-10-CM

## 2022-06-30 DIAGNOSIS — Z87891 Personal history of nicotine dependence: Secondary | ICD-10-CM | POA: Diagnosis not present

## 2022-06-30 DIAGNOSIS — E119 Type 2 diabetes mellitus without complications: Secondary | ICD-10-CM

## 2022-06-30 DIAGNOSIS — M1990 Unspecified osteoarthritis, unspecified site: Secondary | ICD-10-CM | POA: Diagnosis not present

## 2022-06-30 DIAGNOSIS — Z803 Family history of malignant neoplasm of breast: Secondary | ICD-10-CM

## 2022-06-30 DIAGNOSIS — Z90710 Acquired absence of both cervix and uterus: Secondary | ICD-10-CM | POA: Diagnosis not present

## 2022-06-30 DIAGNOSIS — Z79890 Hormone replacement therapy (postmenopausal): Secondary | ICD-10-CM

## 2022-06-30 DIAGNOSIS — C241 Malignant neoplasm of ampulla of Vater: Secondary | ICD-10-CM

## 2022-06-30 DIAGNOSIS — Z86718 Personal history of other venous thrombosis and embolism: Secondary | ICD-10-CM | POA: Diagnosis not present

## 2022-06-30 DIAGNOSIS — Z86711 Personal history of pulmonary embolism: Secondary | ICD-10-CM | POA: Diagnosis not present

## 2022-06-30 NOTE — Progress Notes (Signed)
[facility]   Telephone:([phone] Fax:([phone]   Clinic New Consult Note   Patient Care Team: Worthy Rancher, MD as PCP - General (Internal Medicine)  Date of Service:  06/30/2022   CHIEF COMPLAINTS/PURPOSE OF CONSULTATION:  Second opinion for Metastatic ampullary cancer  REFERRING PHYSICIAN:  Dr. Rikki Spearing  ASSESSMENT & PLAN:  Debra Ryan is a 81 y.o. female with a history of DM, HTN  1. 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 Treated with concurrent chemoRT with gemcitabine 07/06/21 - 08/13/21, then continued gemcitabine alone through 09/20/21. -she has been under surveillance with Dr. Cloretta Ned at [facility].  -CT CAP 05/30/22 showed: interval increased size of numerous bilateral pulmonary nodules, largest 1.8cm.  I personally reviewed the CT scan images with patient and discussed the results with her, which is highly concerning for bilateral metastasis. -CA 19-9 same day significantly elevated at 2,296.2. -She is not convinced the lung nodules are cancerous. I explained the high suspicion for metastasis based on her images and the tumor marker. I discussed the management option of lung biopsy vs PET scan, as her last one was in 09/2021 and was not very informative. -she has not had a lung biopsy yet. She was previously seen by pulmonology, and the nodules were felt to be too small to biopsy at that time. Now her lung nodules measure up to 1.8 cm and are probably amenable to biopsy. She is open to this, and I will refer her to [facility] pulmonology Dr. Elza Rafter to or Dr. Marguarite Arbour for navigational lung biopsy -If lung biopsy confirmed metastatic ampullary carcinoma, I will request MSI, and [employer] One test, to see if she is a candidate for immunotherapy or targeted therapy.  I discussed the option of chemotherapy and immunotherapy briefly. She is open to discuss further if  biopsy shows malignancy  -we will plan to complete her work up in 2-3 weeks, and I will schedule a MyChart visit to review the results.   PLAN:  -We will refer her to [place] [facility] for lung biopsy to be done soon -MyChart visit in 2-3 days after biopsy   Oncology History  Ampullary carcinoma (HCC)  12/31/2020 Imaging   CLINICAL DATA:  Elevated LFTs   EXAM:  ABDOMEN ULTRASOUND COMPLETE   IMPRESSION:  Cholelithiasis. Gallbladder is distended. Associated intrahepatic  and extrahepatic biliary ductal dilatation. The pancreatic duct in  the pancreatic head appears dilated as well. Insert further  evaluation with MRCP/MRI abdomen to exclude pancreatic head mass or obstructing stone.    01/27/2021 Imaging   EXAM:  MRI ABDOMEN WITHOUT AND WITH CONTRAST (INCLUDING MRCP)   IMPRESSION:  1. Suspected tiny gallstones layering in the distended gallbladder.  No gallbladder wall thickening or pericholecystic fluid.  2. Mild diffuse intrahepatic biliary ductal dilatation. Dilated  common bile duct (11 mm diameter) with mild smooth distal tapering of the CBD. No evidence of choledocholithiasis or obstructing mass. Differential includes ampullary stricture or chronic opioid use. Consider ERCP correlation.  3. Diffuse pancreatic duct dilation with irregular beaded appearance to the pancreatic duct. Diffuse advanced atrophy of the pancreatic parenchyma. No discrete pancreatic masses. Findings are compatible with chronic pancreatitis.    02/12/2021 Procedure   EUS Upper GI  Impression  Pancreatic parenchymal changes, unclear if represents chronic pancreatitis or secondary changes from obstruction at the HOP/ampulla.  Hypoechoic appearing ampullary region, but no clear targets for tissue acquisition via EUS.  CBD/PD dilation.  02/12/2021 Initial Biopsy   Final Pathologic Diagnosis     A.  "AMPULLA", BIOPSY: Atypical glands present, suspicious for adenocarcinoma. Background of ulceration with  inflamed granulation tissue (acute and chronic inflammation).   Comment    There are detached fragments of atypical glandular epithelium, as well as focal atypical glands admixed with smooth muscle fibers.  These findings are present in a background of ulceration with inflamed granulation tissue, including acute and chronic inflammation.  Immunoperoxidase studies are performed.  The atypical glands have an elevated Ki-67 labeling index and increased expression of p53.  The findings are suspicious for adenocarcinoma.  The material is limited in quantity.  Additional tissue may be helpful for further classification.     02/12/2021 Tumor Marker   CA 19-9 = 62.7   03/16/2021 Imaging   CT OF THE CHEST, ABDOMEN AND PELVIS WITH INTRAVENOUS CONTRAST (PANCREAS PROTOCOL)  Question small hypodense nodular fullness in the region of the pancreatic head, difficult to assess due to generalized parenchymal atrophy. Uncertain if this could reflect a periampullary cystic lesion versus dilated ductal sidebranch. Consider further assessment by EUS.   Suggestion of subtly increased intrahepatic biliary ductal dilation since 01/27/2021. CBD stent in place.   Pattern of reticulonodular opacification in the upper right lung, potentially infectious/inflammatory but for which continued close attention on follow-up is advised.   03/17/2021 Procedure   ERCP  Findings The ampullary epithelium and surrounding major papilla appeared frondular, polypoid, and mildly ulcerated.  Multiple freehand biopsies were performed of the major papilla/distal CBD through the ERCP scope.     03/17/2021 Pathology Results   Final Pathologic Diagnosis    A. AMPULLA, BIOPSY:               Adenocarcinoma.              See Special Stains   Special Stains    The neoplastic cells are immunopositive for CA 19-9, cytokeratin 7, and cytokeratin 20 (variably positive) with patchy weak positivity for CDX-2.  The stains support the diagnosis.       04/12/2021 Tumor Marker   CA 19-9 = 85.1   09/30/2021 Imaging   CT CHEST ABDOMEN PELVIS W CONTRAST  1.  Similar appearance of the periampullary lesion with persistent dilatation of the common bile duct and main pancreatic duct.  2.  Interval development of new bilateral solid pulmonary nodules and mediastinal and right hilar adenopathy, may reflect metastatic disease in the chest. Attention on follow-up imaging.  3.  No definite evidence of metastatic disease in the abdomen or pelvis.  4.  Wall thickening and enhancement of the dilated common bile duct, potentially secondary to prior intervention. However, ascending cholangitis is not entirely excluded.  5.  New mild interstitial edema, small pericardial effusion, and small bilateral pleural effusions, concerning for fluid overload state.  6.  Persistent reticulonodular opacities in the right upper lobe, may reflect nonspecific infectious/inflammatory state.   10/18/2021 Imaging   PET PANCREATIC CANCER (W/LOW DOSE CT)  Impression   Please note that examination is limited secondary to suboptimal radiotracer distribution, likely related to underlying cardiac function and attenuation due to body habitus. Additionally, a component of radiotracer extravasation cannot be excluded though is not visualized on this examination. However, within technical constraints:   1.  No definite FDG-avid disease. Please refer to recent contrast-enhanced CT from 09/30/2021 for further evaluation.  2.  Status post biliary stent placement with expected pneumobilia.  3.  Findings concerning for CHF/fluid overload, including cardiomegaly,  interstitial and alveolar pulmonary edema, small bilateral pleural effusions, and mild anasarca.   05/30/2022 Imaging   CT CHEST ABDOMEN PELVIS W CONTRAST  Impression   1.  Interval removal of double pigtail CBD stent. Small volume of pneumobilia, which is favored iatrogenic. However, cholangitis in the setting of CBD  obstruction from known Ampulla of Vater mass could also be considered, but is felt less likely in the setting of normal LFTs. Correlate with CBC and clinical history.  2.  Interval increased size of numerous bilateral pulmonary nodules, concerning for progression of metastatic pulmonary disease.  3.  Additional stable ancillary findings, as above.   06/30/2022 Initial Diagnosis   Ampullary carcinoma (HCC)      HISTORY OF PRESENTING ILLNESS:  Debra Ryan 81 y.o. female is a here because of ampullary cancer. The patient was referred by her PCP. The patient presents to the clinic today alone but notes her daughter-in-law is waiting in the lobby.   Today the patient notes they felt/feeling prior/after... -she reports feeling well overall, denies nausea or stomach issues.  She has a PMHx of.... -arthritis -DM, HTN, on medication -h/o DVT, on Xarelto -s/p hysterectomy   Socially... -she is married with two children; her husband is a patient of Dr. Arbutus Ped. -former smoker, 0.5 ppd for 15 years, quit in 1972   REVIEW OF SYSTEMS:    Constitutional: Denies fevers, chills or abnormal night sweats Eyes: Denies blurriness of vision, double vision or watery eyes Ears, nose, mouth, throat, and face: Denies mucositis or sore throat Respiratory: Denies cough, dyspnea or wheezes Cardiovascular: Denies palpitation, chest discomfort or lower extremity swelling Gastrointestinal:  Denies nausea, heartburn or change in bowel habits Skin: Denies abnormal skin rashes Lymphatics: Denies new lymphadenopathy or easy bruising Neurological:Denies numbness, tingling or new weaknesses Behavioral/Psych: Mood is stable, no new changes  All other systems were reviewed with the patient and are negative.   MEDICAL HISTORY:  Past Medical History:  Diagnosis Date   Arthritis    Chronic pain    takes Opana daily and Percocet daily as needed   Colitis    Constipation    takes Carafate and Miralax  daily   Coronary artery disease    Diabetes mellitus    has an Insulin Pump   DVT (deep venous thrombosis) (HCC)    Gastroparesis    GERD (gastroesophageal reflux disease)    High cholesterol    takes Crestor daily   History of blood clots    takes Xarelto and Brilinta daily   Hypertension    takes Coreg and Lisinopril daily   Hypothyroidism    takes Synthroid daily   MI (myocardial infarction) (HCC)    Muscle spasm    takes Tizanidine daily   Neuropathy    Pulmonary embolism (HCC)     SURGICAL HISTORY: Past Surgical History:  Procedure Laterality Date   ABDOMINAL HYSTERECTOMY     ANKLE SURGERY     cardiac stents     CARDIAC SURGERY     CARDIAC SURGERY     cateract surgery     JOINT REPLACEMENT     bilateral knees   TONSILLECTOMY     WRIST SURGERY      SOCIAL HISTORY: Social History   Socioeconomic History   Marital status: Married    Spouse name: Not on file   Number of children: 2   Years of education: Not on file   Highest education level: Not on file  Occupational History  Not on file  Tobacco Use   Smoking status: Former    Packs/day: 0.50    Years: 15.00    Total pack years: 7.50    Types: Cigarettes    Quit date: 10/31/1970    Years since quitting: 51.6   Smokeless tobacco: Never  Substance and Sexual Activity   Alcohol use: No    Comment: she used to be a light weekend drinker before 1972   Drug use: No   Sexual activity: Not on file  Other Topics Concern   Not on file  Social History Narrative   Not on file   Social Determinants of Health   Financial Resource Strain: Not on file  Food Insecurity: Not on file  Transportation Needs: Not on file  Physical Activity: Not on file  Stress: Not on file  Social Connections: Not on file  Intimate Partner Violence: Not on file    FAMILY HISTORY: Family History  Problem Relation Age of Onset   Cancer Mother 72       breast cancer   Cancer Brother 45       prostate cancer    ALLERGIES:   is allergic to clindamycin/lincomycin and sulfa antibiotics.  MEDICATIONS:  Current Outpatient Medications  Medication Sig Dispense Refill   Armodafinil (NUVIGIL) 250 MG tablet Take 250 mg by mouth daily.     Ascorbic Acid (VITAMIN C PO) Take 1 tablet by mouth daily.     aspirin 81 MG tablet Take 81 mg by mouth daily.     atorvastatin (LIPITOR) 40 MG tablet Take 40 mg by mouth daily.     Calcium Acetate, Phos Binder, (CALCIUM ACETATE PO) Take 1 tablet by mouth daily.     Carvedilol (COREG PO) Take 6 mg by mouth.      diclofenac (FLECTOR) 1.3 % PTCH Place 1 patch onto the skin 2 (two) times daily. Patient uses this on her back.     furosemide (LASIX) 20 MG tablet Take 20 mg by mouth daily.      gabapentin (NEURONTIN) 300 MG capsule Take 800 mg by mouth 3 (three) times daily.      Insulin Human (INSULIN PUMP) 100 unit/ml SOLN Inject 22.15 each into the skin 4 (four) times daily. Insulin pump:  @ midnight .09 units of insulin, @ 3:30 am 1.0 units of insulin, @ 9 am .80 units of insulin, @ 6 pm 1.05 units of insulin.     levothyroxine (SYNTHROID, LEVOTHROID) 112 MCG tablet Take 200 mcg by mouth daily.      lidocaine (LIDODERM) 5 % Place 1 patch onto the skin daily. Remove & Discard patch within 12 hours or as directed by MD     lisinopril (PRINIVIL,ZESTRIL) 20 MG tablet Take 20 mg by mouth daily.     Mesalamine (ASACOL HD) 800 MG TBEC Take 1 tablet by mouth daily.     modafinil (PROVIGIL) 200 MG tablet Take 200 mg by mouth daily.     Multiple Vitamins-Minerals (MULTIVITAMIN PO) Take 1 tablet by mouth daily.     Neomycin-Bacitracin-Polymyxin ([employer] TRIPLE ANTIBIOTIC) OINT Apply 1 application topically 2 (two) times daily. 60 g 0   omeprazole (PRILOSEC) 40 MG capsule Take 40 mg by mouth daily.     Ondansetron HCl (ZOFRAN PO) Take 1 tablet by mouth daily as needed. For nausea.     oxyCODONE (OXYCONTIN) 15 mg 12 hr tablet Take 15 mg by mouth every 12 (twelve) hours.     oxyCODONE (ROXICODONE) 15 MG  immediate release tablet Take 15 mg by mouth every 4 (four) hours as needed for pain.     oxyCODONE-acetaminophen (PERCOCET) 5-325 MG per tablet Take 1 tablet by mouth every 4 (four) hours as needed. Patient uses this medication for pain.     Oxymorphone HCl, Crush Resist, (OPANA ER, CRUSH RESISTANT,) 40 MG TB12 Take 1 tablet by mouth 2 times daily at 12 noon and 4 pm.      polyethylene glycol (MIRALAX / GLYCOLAX) packet Take 17 g by mouth daily.     promethazine (PHENERGAN) 12.5 MG tablet Take 1 tablet (12.5 mg total) by mouth every 6 (six) hours as needed for up to 20 days for nausea or vomiting. 20 tablet 0   Rivaroxaban (XARELTO) 15 MG TABS tablet Take 15 mg by mouth daily with supper.     rosuvastatin (CRESTOR) 5 MG tablet Take 5 mg by mouth daily. Patient takes 2.5 mg of this medication every night, except for on Wednesday and Friday.  Then she takes the 5 mg tablet.     sucralfate (CARAFATE) 1 G tablet Take 1 g by mouth 2 (two) times daily.     Ticagrelor (BRILINTA PO) Take 90 mg by mouth.     tiZANidine (ZANAFLEX) 4 MG tablet Take 4 mg by mouth 3 (three) times daily.     urea (CARMOL) 40 % CREA Apply 1 application topically daily. Patient applies this medication to her feet.     vitamin E 100 UNIT capsule Take 100 Units by mouth daily.     zinc gluconate 50 MG tablet Take 50 mg by mouth daily.     No current facility-administered medications for this visit.    PHYSICAL EXAMINATION: ECOG PERFORMANCE STATUS: 1 - Symptomatic but completely ambulatory  Vitals:   06/30/22 1502  BP: (!) 158/60  Pulse: 63  Resp: 17  Temp: 98.2 F (36.8 C)  SpO2: 95%   Filed Weights   06/30/22 1502  Weight: 245 lb 9.6 oz (111.4 kg)    GENERAL:alert, no distress and comfortable SKIN: skin color, texture, turgor are normal, no rashes or significant lesions EYES: normal, Conjunctiva are pink and non-injected, sclera clear  LUNGS: clear to auscultation and percussion with normal breathing  effort HEART: regular rate & rhythm and no murmurs and no lower extremity edema ABDOMEN:abdomen soft, non-tender and normal bowel sounds Musculoskeletal:no cyanosis of digits and no clubbing  NEURO: alert & oriented x 3 with fluent speech, no focal motor/sensory deficits  LABORATORY DATA:  I have reviewed the data as listed    Latest Ref Rng & Units 08/09/2019    8:38 PM 08/09/2019    8:04 PM 11/02/2017   11:20 AM  CBC  WBC 4.0 - 10.5 K/uL  8.1  8.4   Hemoglobin 12.0 - 15.0 g/dL 13.9  12.1  11.4   Hematocrit 36.0 - 46.0 % 41.0  38.3  35.0   Platelets 150 - 400 K/uL  278  225        Latest Ref Rng & Units 08/09/2019    8:38 PM 08/09/2019    8:04 PM 11/02/2017   11:20 AM  CMP  Glucose 70 - 99 mg/dL  123  377   BUN 8 - 23 mg/dL  14  27   Creatinine 0.44 - 1.00 mg/dL  1.15  1.26   Sodium 135 - 145 mmol/L 139  138  134   Potassium 3.5 - 5.1 mmol/L 4.4  3.8  4.3   Chloride 98 -  111 mmol/L  101  101   CO2 22 - 32 mmol/L  26  22   Calcium 8.9 - 10.3 mg/dL  9.4  9.0   Total Protein 6.5 - 8.1 g/dL  6.9  6.6   Total Bilirubin 0.3 - 1.2 mg/dL  0.6  0.8   Alkaline Phos 38 - 126 U/L  96  84   AST 15 - 41 U/L  25  27   ALT 0 - 44 U/L  20  19      RADIOGRAPHIC STUDIES: I have personally reviewed the radiological images as listed and agreed with the findings in the report. No results found.   Orders Placed This Encounter  Procedures   Ambulatory referral to Pulmonology    Referral Priority:   Routine    Referral Type:   Consultation    Referral Reason:   Specialty Services Required    Requested Specialty:   Pulmonary Disease    Number of Visits Requested:   1    All questions were answered. The patient knows to call the clinic with any problems, questions or concerns. The total time spent in the appointment was 60 minutes.     Truitt Merle, MD 06/30/2022   I, Wilburn Mylar, am acting as scribe for Truitt Merle, MD.   I have reviewed the above documentation for accuracy and  completeness, and I agree with the above.

## 2022-07-01 NOTE — Addendum Note (Signed)
Addended by: Estella Husk on: 07/01/2022 09:28 AM   Modules accepted: Orders

## 2022-07-14 ENCOUNTER — Institutional Professional Consult (permissible substitution): Payer: Medicare Other | Admitting: Pulmonary Disease

## 2022-08-14 ENCOUNTER — Emergency Department (HOSPITAL_BASED_OUTPATIENT_CLINIC_OR_DEPARTMENT_OTHER): Payer: Medicare Other

## 2022-08-14 ENCOUNTER — Inpatient Hospital Stay (HOSPITAL_BASED_OUTPATIENT_CLINIC_OR_DEPARTMENT_OTHER)
Admission: EM | Admit: 2022-08-14 | Discharge: 2022-08-25 | DRG: 193 | Disposition: A | Discharge status: Home Health | Payer: Medicare Other | Attending: Internal Medicine | Admitting: Internal Medicine

## 2022-08-14 ENCOUNTER — Encounter (HOSPITAL_COMMUNITY): Payer: Self-pay

## 2022-08-14 ENCOUNTER — Other Ambulatory Visit: Payer: Self-pay

## 2022-08-14 ENCOUNTER — Encounter (HOSPITAL_BASED_OUTPATIENT_CLINIC_OR_DEPARTMENT_OTHER): Payer: Self-pay

## 2022-08-14 DIAGNOSIS — N1831 Chronic kidney disease, stage 3a: Secondary | ICD-10-CM | POA: Diagnosis present

## 2022-08-14 DIAGNOSIS — J180 Bronchopneumonia, unspecified organism: Secondary | ICD-10-CM | POA: Diagnosis not present

## 2022-08-14 DIAGNOSIS — Z794 Long term (current) use of insulin: Secondary | ICD-10-CM

## 2022-08-14 DIAGNOSIS — I959 Hypotension, unspecified: Secondary | ICD-10-CM | POA: Diagnosis not present

## 2022-08-14 DIAGNOSIS — K5900 Constipation, unspecified: Secondary | ICD-10-CM | POA: Diagnosis present

## 2022-08-14 DIAGNOSIS — J9601 Acute respiratory failure with hypoxia: Secondary | ICD-10-CM | POA: Diagnosis not present

## 2022-08-14 DIAGNOSIS — I252 Old myocardial infarction: Secondary | ICD-10-CM

## 2022-08-14 DIAGNOSIS — I130 Hypertensive heart and chronic kidney disease with heart failure and stage 1 through stage 4 chronic kidney disease, or unspecified chronic kidney disease: Secondary | ICD-10-CM | POA: Diagnosis present

## 2022-08-14 DIAGNOSIS — C7802 Secondary malignant neoplasm of left lung: Secondary | ICD-10-CM | POA: Diagnosis not present

## 2022-08-14 DIAGNOSIS — R5381 Other malaise: Secondary | ICD-10-CM | POA: Diagnosis present

## 2022-08-14 DIAGNOSIS — I3139 Other pericardial effusion (noninflammatory): Secondary | ICD-10-CM | POA: Diagnosis not present

## 2022-08-14 DIAGNOSIS — G8929 Other chronic pain: Secondary | ICD-10-CM | POA: Diagnosis not present

## 2022-08-14 DIAGNOSIS — E109 Type 1 diabetes mellitus without complications: Secondary | ICD-10-CM

## 2022-08-14 DIAGNOSIS — E1065 Type 1 diabetes mellitus with hyperglycemia: Secondary | ICD-10-CM | POA: Diagnosis present

## 2022-08-14 DIAGNOSIS — Z6841 Body Mass Index (BMI) 40.0 and over, adult: Secondary | ICD-10-CM | POA: Diagnosis not present

## 2022-08-14 DIAGNOSIS — I2510 Atherosclerotic heart disease of native coronary artery without angina pectoris: Secondary | ICD-10-CM

## 2022-08-14 DIAGNOSIS — Z7902 Long term (current) use of antithrombotics/antiplatelets: Secondary | ICD-10-CM

## 2022-08-14 DIAGNOSIS — I4892 Unspecified atrial flutter: Secondary | ICD-10-CM | POA: Diagnosis not present

## 2022-08-14 DIAGNOSIS — E7800 Pure hypercholesterolemia, unspecified: Secondary | ICD-10-CM | POA: Diagnosis present

## 2022-08-14 DIAGNOSIS — E1022 Type 1 diabetes mellitus with diabetic chronic kidney disease: Secondary | ICD-10-CM | POA: Diagnosis present

## 2022-08-14 DIAGNOSIS — Z8509 Personal history of malignant neoplasm of other digestive organs: Secondary | ICD-10-CM

## 2022-08-14 DIAGNOSIS — G894 Chronic pain syndrome: Secondary | ICD-10-CM

## 2022-08-14 DIAGNOSIS — J9621 Acute and chronic respiratory failure with hypoxia: Secondary | ICD-10-CM | POA: Diagnosis present

## 2022-08-14 DIAGNOSIS — N179 Acute kidney failure, unspecified: Secondary | ICD-10-CM | POA: Diagnosis not present

## 2022-08-14 DIAGNOSIS — K219 Gastro-esophageal reflux disease without esophagitis: Secondary | ICD-10-CM | POA: Diagnosis present

## 2022-08-14 DIAGNOSIS — I480 Paroxysmal atrial fibrillation: Secondary | ICD-10-CM | POA: Diagnosis present

## 2022-08-14 DIAGNOSIS — K3184 Gastroparesis: Secondary | ICD-10-CM | POA: Diagnosis present

## 2022-08-14 DIAGNOSIS — Z79891 Long term (current) use of opiate analgesic: Secondary | ICD-10-CM

## 2022-08-14 DIAGNOSIS — G4733 Obstructive sleep apnea (adult) (pediatric): Secondary | ICD-10-CM

## 2022-08-14 DIAGNOSIS — Z90710 Acquired absence of both cervix and uterus: Secondary | ICD-10-CM

## 2022-08-14 DIAGNOSIS — M549 Dorsalgia, unspecified: Secondary | ICD-10-CM | POA: Diagnosis present

## 2022-08-14 DIAGNOSIS — Z7901 Long term (current) use of anticoagulants: Secondary | ICD-10-CM

## 2022-08-14 DIAGNOSIS — E1043 Type 1 diabetes mellitus with diabetic autonomic (poly)neuropathy: Secondary | ICD-10-CM | POA: Diagnosis present

## 2022-08-14 DIAGNOSIS — Z86711 Personal history of pulmonary embolism: Secondary | ICD-10-CM

## 2022-08-14 DIAGNOSIS — Z79890 Hormone replacement therapy (postmenopausal): Secondary | ICD-10-CM

## 2022-08-14 DIAGNOSIS — M1990 Unspecified osteoarthritis, unspecified site: Secondary | ICD-10-CM | POA: Diagnosis present

## 2022-08-14 DIAGNOSIS — Z66 Do not resuscitate: Secondary | ICD-10-CM | POA: Diagnosis not present

## 2022-08-14 DIAGNOSIS — Z1152 Encounter for screening for COVID-19: Secondary | ICD-10-CM

## 2022-08-14 DIAGNOSIS — Z881 Allergy status to other antibiotic agents status: Secondary | ICD-10-CM

## 2022-08-14 DIAGNOSIS — C241 Malignant neoplasm of ampulla of Vater: Secondary | ICD-10-CM | POA: Diagnosis present

## 2022-08-14 DIAGNOSIS — C7801 Secondary malignant neoplasm of right lung: Secondary | ICD-10-CM | POA: Diagnosis present

## 2022-08-14 DIAGNOSIS — E039 Hypothyroidism, unspecified: Secondary | ICD-10-CM

## 2022-08-14 DIAGNOSIS — I5033 Acute on chronic diastolic (congestive) heart failure: Secondary | ICD-10-CM | POA: Diagnosis not present

## 2022-08-14 DIAGNOSIS — E785 Hyperlipidemia, unspecified: Secondary | ICD-10-CM | POA: Diagnosis not present

## 2022-08-14 DIAGNOSIS — D631 Anemia in chronic kidney disease: Secondary | ICD-10-CM | POA: Diagnosis present

## 2022-08-14 DIAGNOSIS — I4891 Unspecified atrial fibrillation: Secondary | ICD-10-CM | POA: Diagnosis not present

## 2022-08-14 DIAGNOSIS — Z87891 Personal history of nicotine dependence: Secondary | ICD-10-CM

## 2022-08-14 DIAGNOSIS — E1042 Type 1 diabetes mellitus with diabetic polyneuropathy: Secondary | ICD-10-CM | POA: Diagnosis not present

## 2022-08-14 DIAGNOSIS — Z882 Allergy status to sulfonamides status: Secondary | ICD-10-CM

## 2022-08-14 DIAGNOSIS — M62838 Other muscle spasm: Secondary | ICD-10-CM | POA: Diagnosis not present

## 2022-08-14 DIAGNOSIS — I10 Essential (primary) hypertension: Secondary | ICD-10-CM

## 2022-08-14 DIAGNOSIS — E1059 Type 1 diabetes mellitus with other circulatory complications: Secondary | ICD-10-CM

## 2022-08-14 DIAGNOSIS — J189 Pneumonia, unspecified organism: Principal | ICD-10-CM

## 2022-08-14 DIAGNOSIS — Z79899 Other long term (current) drug therapy: Secondary | ICD-10-CM

## 2022-08-14 DIAGNOSIS — Y95 Nosocomial condition: Secondary | ICD-10-CM | POA: Diagnosis present

## 2022-08-14 DIAGNOSIS — I5043 Acute on chronic combined systolic (congestive) and diastolic (congestive) heart failure: Secondary | ICD-10-CM

## 2022-08-14 DIAGNOSIS — I5021 Acute systolic (congestive) heart failure: Secondary | ICD-10-CM | POA: Diagnosis not present

## 2022-08-14 DIAGNOSIS — D649 Anemia, unspecified: Secondary | ICD-10-CM

## 2022-08-14 DIAGNOSIS — Z96653 Presence of artificial knee joint, bilateral: Secondary | ICD-10-CM | POA: Diagnosis present

## 2022-08-14 DIAGNOSIS — N183 Chronic kidney disease, stage 3 unspecified: Secondary | ICD-10-CM

## 2022-08-14 DIAGNOSIS — Z86718 Personal history of other venous thrombosis and embolism: Secondary | ICD-10-CM

## 2022-08-14 DIAGNOSIS — Z955 Presence of coronary angioplasty implant and graft: Secondary | ICD-10-CM

## 2022-08-14 HISTORY — DX: Unspecified atrial fibrillation: I48.91

## 2022-08-14 LAB — CBC WITH DIFFERENTIAL/PLATELET
Abs Immature Granulocytes: 0.01 10*3/uL (ref 0.00–0.07)
Basophils Absolute: 0 10*3/uL (ref 0.0–0.1)
Basophils Relative: 1 %
Eosinophils Absolute: 0.2 10*3/uL (ref 0.0–0.5)
Eosinophils Relative: 4 %
HCT: 33.1 % — ABNORMAL LOW (ref 36.0–46.0)
Hemoglobin: 10.7 g/dL — ABNORMAL LOW (ref 12.0–15.0)
Immature Granulocytes: 0 %
Lymphocytes Relative: 13 %
Lymphs Abs: 0.6 10*3/uL — ABNORMAL LOW (ref 0.7–4.0)
MCH: 30.8 pg (ref 26.0–34.0)
MCHC: 32.3 g/dL (ref 30.0–36.0)
MCV: 95.4 fL (ref 80.0–100.0)
Monocytes Absolute: 0.4 10*3/uL (ref 0.1–1.0)
Monocytes Relative: 9 %
Neutro Abs: 3.1 10*3/uL (ref 1.7–7.7)
Neutrophils Relative %: 73 %
Platelets: 227 10*3/uL (ref 150–400)
RBC: 3.47 MIL/uL — ABNORMAL LOW (ref 3.87–5.11)
RDW: 14.2 % (ref 11.5–15.5)
WBC: 4.3 10*3/uL (ref 4.0–10.5)
nRBC: 0 % (ref 0.0–0.2)

## 2022-08-14 LAB — BASIC METABOLIC PANEL
Anion gap: 6 (ref 5–15)
BUN: 9 mg/dL (ref 8–23)
CO2: 26 mmol/L (ref 22–32)
Calcium: 8.3 mg/dL — ABNORMAL LOW (ref 8.9–10.3)
Chloride: 108 mmol/L (ref 98–111)
Creatinine, Ser: 1.03 mg/dL — ABNORMAL HIGH (ref 0.44–1.00)
GFR, Estimated: 55 mL/min — ABNORMAL LOW (ref >60)
Glucose, Bld: 185 mg/dL — ABNORMAL HIGH (ref 70–99)
Potassium: 4.1 mmol/L (ref 3.5–5.1)
Sodium: 140 mmol/L (ref 135–145)

## 2022-08-14 LAB — SARS CORONAVIRUS 2 BY RT PCR: SARS Coronavirus 2 by RT PCR: NEGATIVE

## 2022-08-14 LAB — TROPONIN I (HIGH SENSITIVITY)
Troponin I (High Sensitivity): 7 ng/L (ref <18)
Troponin I (High Sensitivity): 7 ng/L (ref <18)

## 2022-08-14 LAB — CBG MONITORING, ED: Glucose-Capillary: 146 mg/dL — ABNORMAL HIGH (ref 70–99)

## 2022-08-14 LAB — BRAIN NATRIURETIC PEPTIDE: B Natriuretic Peptide: 470.5 pg/mL — ABNORMAL HIGH (ref 0.0–100.0)

## 2022-08-14 LAB — LACTIC ACID, PLASMA: Lactic Acid, Venous: 1.2 mmol/L (ref 0.5–1.9)

## 2022-08-14 MED ORDER — VANCOMYCIN HCL 500 MG IV SOLR
500.0000 mg | Freq: Once | INTRAVENOUS | Status: AC
  Administered 2022-08-14: 500 mg via INTRAVENOUS

## 2022-08-14 MED ORDER — CARVEDILOL 12.5 MG PO TABS
12.5000 mg | ORAL_TABLET | Freq: Two times a day (BID) | ORAL | Status: DC
  Administered 2022-08-14: 12.5 mg via ORAL

## 2022-08-14 MED ORDER — CARVEDILOL 12.5 MG PO TABS
12.5000 mg | ORAL_TABLET | Freq: Two times a day (BID) | ORAL | Status: DC
  Filled 2022-08-14: qty 1

## 2022-08-14 MED ORDER — ALBUTEROL SULFATE (2.5 MG/3ML) 0.083% IN NEBU
2.5000 mg | INHALATION_SOLUTION | RESPIRATORY_TRACT | Status: DC | PRN
  Administered 2022-08-14 – 2022-08-18 (×2): 2.5 mg via RESPIRATORY_TRACT
  Filled 2022-08-14 (×3): qty 3

## 2022-08-14 MED ORDER — IPRATROPIUM-ALBUTEROL 0.5-2.5 (3) MG/3ML IN SOLN
RESPIRATORY_TRACT | Status: AC
  Administered 2022-08-14: 3 mL
  Filled 2022-08-14: qty 3

## 2022-08-14 MED ORDER — VANCOMYCIN HCL 750 MG/150ML IV SOLN
750.0000 mg | INTRAVENOUS | Status: DC
  Filled 2022-08-14: qty 150

## 2022-08-14 MED ORDER — OXYCODONE HCL ER 15 MG PO T12A
15.0000 mg | EXTENDED_RELEASE_TABLET | Freq: Two times a day (BID) | ORAL | Status: DC
  Filled 2022-08-14: qty 1

## 2022-08-14 MED ORDER — VANCOMYCIN HCL IN DEXTROSE 1-5 GM/200ML-% IV SOLN
1000.0000 mg | Freq: Once | INTRAVENOUS | Status: AC
  Administered 2022-08-14: 1000 mg via INTRAVENOUS
  Filled 2022-08-14: qty 200

## 2022-08-14 MED ORDER — ALBUTEROL (5 MG/ML) CONTINUOUS INHALATION SOLN
INHALATION_SOLUTION | RESPIRATORY_TRACT | Status: AC
  Administered 2022-08-14: 2.5 mg
  Filled 2022-08-14: qty 0.5

## 2022-08-14 MED ORDER — SODIUM CHLORIDE 0.9 % IV SOLN
1.0000 g | Freq: Once | INTRAVENOUS | Status: AC
  Administered 2022-08-14: 1 g via INTRAVENOUS

## 2022-08-14 NOTE — Progress Notes (Signed)
Pharmacy Antibiotic Note  Debra Ryan is a 81 y.o. female admitted on 08/14/2022 presenting with SOB, concern for pna.  Pharmacy has been consulted for vancomycin dosing.  Gram negative coverage per MD  Plan: Vancomycin 1500 mg IV x 1, then 750 mg IV q 24h (eAUC 403) Add MRSA PCR Monitor renal function, Cx/PCR to narrow Vancomycin levels as needed  Height: '5\' 2"'$  (157.5 cm) Weight: 111.1 kg (245 lb) IBW/kg (Calculated) : 50.1  Temp (24hrs), Avg:97.4 F (36.3 C), Min:97.4 F (36.3 C), Max:97.4 F (36.3 C)  Recent Labs  Lab 08/14/22 1643  WBC 4.3  CREATININE 1.03*    Estimated Creatinine Clearance: 52.1 mL/min (A) (by C-G formula based on SCr of 1.03 mg/dL (H)).    Allergies  Allergen Reactions   Clindamycin/Lincomycin Nausea And Vomiting   Sulfa Antibiotics Hives    Bertis Ruddy, PharmD Clinical Pharmacist ED Pharmacist Phone # [phone] 08/14/2022 6:01 PM

## 2022-08-14 NOTE — ED Triage Notes (Signed)
Reports having wheezing and cough and chest tightness.  Patient reports she was in the hospital with PNA and she thinks it is back. +SOB  Denies fever.  Audible wheezing in triage noted.

## 2022-08-14 NOTE — ED Notes (Signed)
Pt provided microwaveable meal, hot tea per her request. Call bell within reach, will continue to monitor.

## 2022-08-14 NOTE — ED Notes (Signed)
One set blood cultures drawn and held with IV start.

## 2022-08-14 NOTE — ED Provider Notes (Signed)
[facility] Provider Note   CSN: [id number] Arrival date & time: 08/14/22  1605     History  Chief Complaint  Patient presents with   Cough    Debra Ryan is a 81 y.o. female.  81 y.o female with a PMH of CAD, MI, DVT, Afib on xarelto presents to the ED with a chief complaint of shortness of breath x yesterday.  Patient arrived to the ED after being discharged from rehab facility 2 days ago.  Endorsing severe shortness of breath along with chest pain.  He also noted some wheezing, recently discharged with pneumonia from the hospital and believes this has likely returned.  She did smoke tobacco up until the age of 21.  Also endorsing a cough which is nonproductive.  She has tried taking some over-the-counter cough medication without any improvement in symptoms.  She did receive nebulizer treatment when she arrived to the ED with much improvement.  She does not wear oxygen at home but is currently requiring 2 L via nasal cannula as she was satting at 92% on room air.  Denies any fevers, no abdominal pain, no urinary symptoms.  The history is provided by the patient and medical records.  Cough Cough characteristics:  Non-productive Severity:  Moderate Onset quality:  Sudden Duration:  1 day Timing:  Intermittent Progression:  Worsening Chronicity:  New Smoker: no   Associated symptoms: chest pain and shortness of breath   Associated symptoms: no chills, no fever, no headaches and no sore throat        Home Medications Prior to Admission medications   Medication Sig Start Date End Date Taking? Authorizing Provider  Armodafinil (NUVIGIL) 250 MG tablet Take 250 mg by mouth daily.    [provider]  Ascorbic Acid (VITAMIN C PO) Take 1 tablet by mouth daily.    [provider]  aspirin 81 MG tablet Take 81 mg by mouth daily.    [provider]  atorvastatin (LIPITOR) 40 MG tablet Take 40 mg by mouth daily.     [provider]  Carvedilol (COREG PO) Take 6 mg by mouth.     [provider]  furosemide (LASIX) 20 MG tablet Take 20 mg by mouth daily.     [provider]  Insulin Human (INSULIN PUMP) 100 unit/ml SOLN Inject 22.15 each into the skin 4 (four) times daily. Insulin pump:  @ midnight .09 units of insulin, @ 3:30 am 1.0 units of insulin, @ 9 am .80 units of insulin, @ 6 pm 1.05 units of insulin.    [provider]  levothyroxine (SYNTHROID, LEVOTHROID) 112 MCG tablet Take 200 mcg by mouth daily.     [provider]  lisinopril (PRINIVIL,ZESTRIL) 20 MG tablet Take 20 mg by mouth daily.    [provider]  Mesalamine (ASACOL HD) 800 MG TBEC Take 1 tablet by mouth daily.    [provider]  modafinil (PROVIGIL) 200 MG tablet Take 200 mg by mouth daily.    [provider]  Multiple Vitamins-Minerals (MULTIVITAMIN PO) Take 1 tablet by mouth daily.    [provider]  omeprazole (PRILOSEC) 40 MG capsule Take 40 mg by mouth daily.    [provider]  oxyCODONE (OXYCONTIN) 15 mg 12 hr tablet Take 15 mg by mouth every 12 (twelve) hours.    [provider]  oxyCODONE (ROXICODONE) 15 MG immediate release tablet Take 15 mg by mouth every 4 (four) hours as needed  for pain.    [provider]  oxyCODONE-acetaminophen (PERCOCET) 5-325 MG per tablet Take 1 tablet by mouth every 4 (four) hours as needed. Patient uses this medication for pain.    [provider]  polyethylene glycol (MIRALAX / GLYCOLAX) packet Take 17 g by mouth daily.    [provider]  Rivaroxaban (XARELTO) 15 MG TABS tablet Take 15 mg by mouth daily with supper.    [provider]  rosuvastatin (CRESTOR) 5 MG tablet Take 5 mg by mouth daily. Patient takes 2.5 mg of this medication every night, except for on Wednesday and Friday.  Then she takes the 5 mg tablet.    [provider]  sucralfate (CARAFATE) 1 G  tablet Take 1 g by mouth 2 (two) times daily.    [provider]  Ticagrelor (BRILINTA PO) Take 90 mg by mouth.    [provider]  urea (CARMOL) 40 % CREA Apply 1 application topically daily. Patient applies this medication to her feet.    [provider]  vitamin E 100 UNIT capsule Take 100 Units by mouth daily.    [provider]      Allergies    Clindamycin/lincomycin and Sulfa antibiotics    Review of Systems   Review of Systems  Constitutional:  Negative for chills and fever.  HENT:  Negative for sore throat.   Respiratory:  Positive for cough and shortness of breath.   Cardiovascular:  Positive for chest pain and leg swelling.  Gastrointestinal:  Negative for abdominal pain, diarrhea, nausea and vomiting.  Genitourinary:  Negative for flank pain.  Musculoskeletal:  Negative for back pain.  Neurological:  Negative for facial asymmetry and headaches.  All other systems reviewed and are negative.   Physical Exam Updated Vital Signs BP (!) 142/76   Pulse (!) 109   Temp (!) 97.4 F (36.3 C)   Resp 18   Ht '5\' 2"'$  (1.575 m)   Wt 111.1 kg   SpO2 94%   BMI 44.81 kg/m  Physical Exam Vitals and nursing note reviewed.  Constitutional:      Appearance: She is ill-appearing.  HENT:     Head: Normocephalic and atraumatic.     Mouth/Throat:     Mouth: Mucous membranes are moist.  Eyes:     Pupils: Pupils are equal, round, and reactive to light.  Cardiovascular:     Rate and Rhythm: Rhythm irregular.  Pulmonary:     Effort: Prolonged expiration present. No retractions.     Breath sounds: Examination of the right-upper field reveals wheezing. Examination of the left-upper field reveals wheezing. Examination of the right-middle field reveals wheezing. Examination of the right-lower field reveals wheezing. Examination of the left-lower field reveals wheezing. Wheezing present.     Comments: Difficulty with deep inspiration.  Wheezing noted  throughout. Abdominal:     General: Abdomen is flat.     Palpations: Abdomen is soft.     Tenderness: There is no abdominal tenderness.  Musculoskeletal:     Cervical back: Normal range of motion and neck supple.     Right lower leg: 1+ Edema present.     Left lower leg: 1+ Edema present.  Skin:    General: Skin is warm and dry.  Neurological:     Mental Status: She is alert and oriented to person, place, and time.     ED Results / Procedures / Treatments   Labs (all labs ordered are listed, but only abnormal results  are displayed) Labs Reviewed  CBC WITH DIFFERENTIAL/PLATELET - Abnormal; Notable for the following components:      Result Value   RBC 3.47 (*)    Hemoglobin 10.7 (*)    HCT 33.1 (*)    Lymphs Abs 0.6 (*)    All other components within normal limits  BASIC METABOLIC PANEL - Abnormal; Notable for the following components:   Glucose, Bld 185 (*)    Creatinine, Ser 1.03 (*)    Calcium 8.3 (*)    GFR, Estimated 55 (*)    All other components within normal limits  BRAIN NATRIURETIC PEPTIDE - Abnormal; Notable for the following components:   B Natriuretic Peptide 470.5 (*)    All other components within normal limits  SARS CORONAVIRUS 2 BY RT PCR  CULTURE, BLOOD (ROUTINE X 2)  CULTURE, BLOOD (ROUTINE X 2)  MRSA NEXT GEN BY PCR, NASAL  LACTIC ACID, PLASMA  TROPONIN I (HIGH SENSITIVITY)  TROPONIN I (HIGH SENSITIVITY)    EKG None  Radiology DG Chest 2 View  Result Date: 08/14/2022 CLINICAL DATA:  Cough. EXAM: CHEST - 2 VIEW COMPARISON:  07/24/2022. FINDINGS: Cardiac silhouette is enlarged, but stable. No mediastinal or hilar masses. No convincing adenopathy. Mild bilateral interstitial thickening most evident in the lower lungs. Lower lobe bronchial wall thickening with mild patchy airspace opacities in the lower lobes. Subtle nodular opacities noted in upper lungs. No pleural effusion.  No pneumothorax. Skeletal structures are intact. IMPRESSION: 1. Bronchial  wall thickening with mild patchy opacities in the lung bases suggesting bronchitis possible bronchopneumonia. 2. Bilateral interstitial thickening, less notable than on prior exams. Cannot exclude a mild component congestive heart failure. Small nodular opacities reflect nodules better seen on the CT from 07/22/2022. Electronically Signed   By: Lajean Manes M.D.   On: 08/14/2022 16:47    Procedures Procedures    Medications Ordered in ED Medications  albuterol (PROVENTIL) (2.5 MG/3ML) 0.083% nebulizer solution 2.5 mg (has no administration in time range)  vancomycin (VANCOCIN) IVPB 1000 mg/200 mL premix (1,000 mg Intravenous New Bag/Given 08/14/22 1835)    Followed by  vancomycin (VANCOCIN) 500 mg in sodium chloride 0.9 % 100 mL IVPB (has no administration in time range)  vancomycin (VANCOREADY) IVPB 750 mg/150 mL (has no administration in time range)  ipratropium-albuterol (DUONEB) 0.5-2.5 (3) MG/3ML nebulizer solution (3 mLs  Given 08/14/22 1634)  albuterol (VENTOLIN) (5 MG/ML) 0.5% continuous inhalation solution (2.5 mg  Given 08/14/22 1634)  ceFEPIme (MAXIPIME) 1 g in sodium chloride 0.9 % 100 mL IVPB (1 g Intravenous New Bag/Given 08/14/22 1746)    ED Course/ Medical Decision Making/ A&P                           Medical Decision Making Amount and/or Complexity of Data Reviewed Labs: ordered. Radiology: ordered.  Risk Prescription drug management.  This patient presents to the ED for concern of shortness of breath, this involves a number of treatment options, and is a complaint that carries with it a high risk of complications and morbidity.  The differential diagnosis includes pneumonia, pulmonary embolism versus ACS.    Co morbidities: Discussed in HPI   Brief History:  Presents to the ED after released from a rehab facility on Friday.  Sudden onset of shortness of breath yesterday, begins to feel worsening.  Wheezing noted throughout.  Underlying history of A-fib  anticoagulated on Xarelto.  No chest pain, does have a cough but  no fevers.  EMR reviewed including pt PMHx, past surgical history and past visits to ER.   See HPI for more details   Lab Tests:  I ordered and independently interpreted labs.  The pertinent results include:    Labs notable for CBC with no leukocytosis, hemoglobin is slightly decreased at 10.7, BMP with no electrolyte derangement, creatinine level slightly elevated on today's visit.  BNP is 470, some signs of volume overload on exam with 1+ pitting edema bilaterally.  First troponin is negative, her COVID-19 test is also negative.   Imaging Studies:  Chest xray showed: 1. Bronchial wall thickening with mild patchy opacities in the lung  bases suggesting bronchitis possible bronchopneumonia.  2. Bilateral interstitial thickening, less notable than on prior  exams. Cannot exclude a mild component congestive heart failure.  Small nodular opacities reflect nodules better seen on the CT from    Cardiac Monitoring:  The patient was maintained on a cardiac monitor.  I personally viewed and interpreted the cardiac monitored which showed an underlying rhythm of: Irregularly irregular EKG non-ischemic   Medicines ordered:  I ordered medication including albuterol, DuoNeb for breathing improvement Reevaluation of the patient after these medicines showed that the patient stayed the same I have reviewed the patients home medicines and have made adjustments as needed   Critical Interventions:   patient started on antibiotics such as vancomycin, cefepime to treat for hospital-acquired pneumonia, with a new oxygen requirement of 2 L via nasal cannula.  Reevaluation:  After the interventions noted above I re-evaluated patient and found that they have :stayed the same   Social Determinants of Health:  The patient's social determinants of health were a factor in the care of this patient    Problem List / ED  Course:  Here with shortness of breath which began yesterday.  Recent discharge from rehabilitation center after developing pneumonia in the hospital.  Underlying history of A-fib and anticoagulated on Xarelto.  Arrived to the ED hypoxic with an O2 saturation at 89%, tachypnea also present.  EKG remarkable for A-fib with a rate of 91.  Patient does report feeling very fatigued with any ambulation.  BNP not as elevated as her prior, does have signs of fluid overload on exam with 1+ pitting edema bilaterally.  Blood pressure in normal limits.  She does appear fatigued, wheezing throughout her entire lung fields. Lab such as CBC and CMP are within her baseline.  Troponin is negative, EKG is nonischemic I do not suspect ACS at this time.  X-ray did not show concern for bronchopneumonia.  Patient is recent discharge from the rehabilitation center treated empirically with cefepime and vancomycin with her new oxygen requirement I do feel that patient needs admission into the hospital at this time.  Call placed for hospitalist. Patient along with family at the bedside have been updated on plan and treatment.  They are both agreeable of admission at this time.  Patient hemodynamically stable for discharge.   Dispostion:  After consideration of the diagnostic results and the patients response to treatment, I feel that the patent would benefit from   6:54 PM Spoke to Dr. Posey Pronto of hospitalist service who agreed with admission for further management of hospital-acquired pneumonia.   Portions of this note were generated with [employer]. Dictation errors may occur despite best attempts at proofreading.   Final Clinical Impression(s) / ED Diagnoses Final diagnoses:  HAP (hospital-acquired pneumonia)    Rx / DC Orders ED Discharge Orders  None         Janeece Fitting, PA-C 08/14/22 1856    Fredia Sorrow, MD 08/15/22 [phone]

## 2022-08-14 NOTE — ED Notes (Addendum)
Pt ambulatory to bathroom using walker, on room air. On return to exam room, pt with increased work of breathing, audible expiratory wheezing, O2 sats noted to be 87% on RA. Pt assisted back into bed, [place] placed at 2L/min.  O2 sats increased to 98%.    RT made aware, to bedside to assess.

## 2022-08-14 NOTE — Progress Notes (Signed)
Patient has Q2 PRN albuterol nebulizer treatments ordered.  Patient states she felt better after having a nebulizer treatment earlier and woulf like to have another nebulizer treatment.  Patient is short of breath after returning from the bathroom.

## 2022-08-14 NOTE — ED Notes (Signed)
ED Provider at bedside. 

## 2022-08-15 ENCOUNTER — Other Ambulatory Visit: Payer: Self-pay

## 2022-08-15 DIAGNOSIS — D649 Anemia, unspecified: Secondary | ICD-10-CM

## 2022-08-15 DIAGNOSIS — J180 Bronchopneumonia, unspecified organism: Secondary | ICD-10-CM | POA: Diagnosis not present

## 2022-08-15 DIAGNOSIS — G8929 Other chronic pain: Secondary | ICD-10-CM

## 2022-08-15 DIAGNOSIS — E039 Hypothyroidism, unspecified: Secondary | ICD-10-CM

## 2022-08-15 DIAGNOSIS — E785 Hyperlipidemia, unspecified: Secondary | ICD-10-CM

## 2022-08-15 DIAGNOSIS — I2510 Atherosclerotic heart disease of native coronary artery without angina pectoris: Secondary | ICD-10-CM

## 2022-08-15 DIAGNOSIS — I5033 Acute on chronic diastolic (congestive) heart failure: Secondary | ICD-10-CM

## 2022-08-15 DIAGNOSIS — G894 Chronic pain syndrome: Secondary | ICD-10-CM

## 2022-08-15 DIAGNOSIS — E109 Type 1 diabetes mellitus without complications: Secondary | ICD-10-CM

## 2022-08-15 DIAGNOSIS — I10 Essential (primary) hypertension: Secondary | ICD-10-CM

## 2022-08-15 DIAGNOSIS — I480 Paroxysmal atrial fibrillation: Secondary | ICD-10-CM

## 2022-08-15 DIAGNOSIS — N183 Chronic kidney disease, stage 3 unspecified: Secondary | ICD-10-CM

## 2022-08-15 DIAGNOSIS — I5043 Acute on chronic combined systolic (congestive) and diastolic (congestive) heart failure: Secondary | ICD-10-CM

## 2022-08-15 DIAGNOSIS — G4733 Obstructive sleep apnea (adult) (pediatric): Secondary | ICD-10-CM

## 2022-08-15 LAB — CBC
HCT: 32.6 % — ABNORMAL LOW (ref 36.0–46.0)
Hemoglobin: 10.2 g/dL — ABNORMAL LOW (ref 12.0–15.0)
MCH: 30.5 pg (ref 26.0–34.0)
MCHC: 31.3 g/dL (ref 30.0–36.0)
MCV: 97.6 fL (ref 80.0–100.0)
Platelets: 187 10*3/uL (ref 150–400)
RBC: 3.34 MIL/uL — ABNORMAL LOW (ref 3.87–5.11)
RDW: 14 % (ref 11.5–15.5)
WBC: 5.4 10*3/uL (ref 4.0–10.5)
nRBC: 0 % (ref 0.0–0.2)

## 2022-08-15 LAB — GLUCOSE, CAPILLARY
Glucose-Capillary: 377 mg/dL — ABNORMAL HIGH (ref 70–99)
Glucose-Capillary: 436 mg/dL — ABNORMAL HIGH (ref 70–99)
Glucose-Capillary: 353 mg/dL — ABNORMAL HIGH (ref 70–99)
Glucose-Capillary: 346 mg/dL — ABNORMAL HIGH (ref 70–99)
Glucose-Capillary: 242 mg/dL — ABNORMAL HIGH (ref 70–99)

## 2022-08-15 LAB — COMPREHENSIVE METABOLIC PANEL
ALT: 16 U/L (ref 0–44)
AST: 24 U/L (ref 15–41)
Albumin: 3.2 g/dL — ABNORMAL LOW (ref 3.5–5.0)
Alkaline Phosphatase: 106 U/L (ref 38–126)
Anion gap: 9 (ref 5–15)
BUN: 13 mg/dL (ref 8–23)
CO2: 21 mmol/L — ABNORMAL LOW (ref 22–32)
Calcium: 8.6 mg/dL — ABNORMAL LOW (ref 8.9–10.3)
Chloride: 107 mmol/L (ref 98–111)
Creatinine, Ser: 1.08 mg/dL — ABNORMAL HIGH (ref 0.44–1.00)
GFR, Estimated: 52 mL/min — ABNORMAL LOW (ref >60)
Glucose, Bld: 430 mg/dL — ABNORMAL HIGH (ref 70–99)
Potassium: 4.8 mmol/L (ref 3.5–5.1)
Sodium: 137 mmol/L (ref 135–145)
Total Bilirubin: 1.1 mg/dL (ref 0.3–1.2)
Total Protein: 5.9 g/dL — ABNORMAL LOW (ref 6.5–8.1)

## 2022-08-15 LAB — MRSA NEXT GEN BY PCR, NASAL: MRSA by PCR Next Gen: NOT DETECTED

## 2022-08-15 LAB — PROCALCITONIN: Procalcitonin: <0.1 ng/mL

## 2022-08-15 LAB — HEMOGLOBIN A1C
Hgb A1c MFr Bld: 8.8 % — ABNORMAL HIGH (ref 4.8–5.6)
Mean Plasma Glucose: 205.86 mg/dL

## 2022-08-15 MED ORDER — PANTOPRAZOLE SODIUM 40 MG PO TBEC
80.0000 mg | DELAYED_RELEASE_TABLET | Freq: Every day | ORAL | Status: DC
  Administered 2022-08-15 – 2022-08-25 (×11): 80 mg via ORAL
  Filled 2022-08-15 (×12): qty 2

## 2022-08-15 MED ORDER — INSULIN ASPART 100 UNIT/ML IJ SOLN
5.0000 [IU] | Freq: Once | INTRAMUSCULAR | Status: AC
  Administered 2022-08-15: 5 [IU] via SUBCUTANEOUS

## 2022-08-15 MED ORDER — ASPIRIN 81 MG PO CHEW
81.0000 mg | CHEWABLE_TABLET | Freq: Every day | ORAL | Status: DC
  Administered 2022-08-15 – 2022-08-25 (×11): 81 mg via ORAL
  Filled 2022-08-15 (×11): qty 1

## 2022-08-15 MED ORDER — INSULIN PUMP
SUBCUTANEOUS | Status: DC
  Filled 2022-08-15: qty 1

## 2022-08-15 MED ORDER — INSULIN ASPART 100 UNIT/ML IJ SOLN
0.0000 [IU] | Freq: Three times a day (TID) | INTRAMUSCULAR | Status: DC
  Administered 2022-08-15 (×2): 9 [IU] via SUBCUTANEOUS
  Administered 2022-08-16: 2 [IU] via SUBCUTANEOUS
  Administered 2022-08-16 (×2): 3 [IU] via SUBCUTANEOUS
  Administered 2022-08-17 (×3): 2 [IU] via SUBCUTANEOUS
  Administered 2022-08-18: 3 [IU] via SUBCUTANEOUS
  Administered 2022-08-18: 2 [IU] via SUBCUTANEOUS
  Administered 2022-08-18: 3 [IU] via SUBCUTANEOUS
  Administered 2022-08-19: 7 [IU] via SUBCUTANEOUS
  Administered 2022-08-19 – 2022-08-20 (×3): 9 [IU] via SUBCUTANEOUS
  Administered 2022-08-20 (×2): 7 [IU] via SUBCUTANEOUS
  Administered 2022-08-21 (×2): 5 [IU] via SUBCUTANEOUS
  Administered 2022-08-21: 7 [IU] via SUBCUTANEOUS
  Administered 2022-08-22: 3 [IU] via SUBCUTANEOUS
  Administered 2022-08-22: 2 [IU] via SUBCUTANEOUS
  Administered 2022-08-22: 3 [IU] via SUBCUTANEOUS
  Administered 2022-08-23: 2 [IU] via SUBCUTANEOUS
  Administered 2022-08-24 (×3): 1 [IU] via SUBCUTANEOUS

## 2022-08-15 MED ORDER — INSULIN GLARGINE-YFGN 100 UNIT/ML ~~LOC~~ SOLN
5.0000 [IU] | Freq: Every day | SUBCUTANEOUS | Status: DC
  Administered 2022-08-15 – 2022-08-18 (×4): 5 [IU] via SUBCUTANEOUS
  Filled 2022-08-15 (×5): qty 0.05

## 2022-08-15 MED ORDER — SODIUM CHLORIDE 0.9 % IV SOLN: 2.0000 g | Freq: Three times a day (TID) | INTRAVENOUS | Status: DC

## 2022-08-15 MED ORDER — OXYCODONE HCL ER 15 MG PO T12A
15.0000 mg | EXTENDED_RELEASE_TABLET | Freq: Two times a day (BID) | ORAL | Status: DC
  Administered 2022-08-15 – 2022-08-25 (×21): 15 mg via ORAL
  Filled 2022-08-15 (×21): qty 1

## 2022-08-15 MED ORDER — RIVAROXABAN 15 MG PO TABS
15.0000 mg | ORAL_TABLET | Freq: Every day | ORAL | Status: DC
  Administered 2022-08-15 – 2022-08-24 (×10): 15 mg via ORAL
  Filled 2022-08-15 (×10): qty 1

## 2022-08-15 MED ORDER — GUAIFENESIN ER 600 MG PO TB12
600.0000 mg | ORAL_TABLET | Freq: Two times a day (BID) | ORAL | Status: DC
  Administered 2022-08-15 – 2022-08-25 (×22): 600 mg via ORAL
  Filled 2022-08-15 (×22): qty 1

## 2022-08-15 MED ORDER — CARVEDILOL 12.5 MG PO TABS
12.5000 mg | ORAL_TABLET | Freq: Two times a day (BID) | ORAL | Status: DC
  Administered 2022-08-15 – 2022-08-19 (×9): 12.5 mg via ORAL
  Filled 2022-08-15 (×9): qty 1

## 2022-08-15 MED ORDER — ONDANSETRON HCL 4 MG/2ML IJ SOLN
4.0000 mg | Freq: Four times a day (QID) | INTRAMUSCULAR | Status: DC | PRN
  Administered 2022-08-15 – 2022-08-17 (×2): 4 mg via INTRAVENOUS
  Filled 2022-08-15 (×2): qty 2

## 2022-08-15 MED ORDER — LEVOTHYROXINE SODIUM 100 MCG PO TABS
200.0000 ug | ORAL_TABLET | Freq: Every day | ORAL | Status: DC
  Administered 2022-08-16 – 2022-08-25 (×10): 200 ug via ORAL
  Filled 2022-08-15 (×10): qty 2

## 2022-08-15 MED ORDER — INSULIN ASPART 100 UNIT/ML IJ SOLN
15.0000 [IU] | Freq: Once | INTRAMUSCULAR | Status: AC
  Administered 2022-08-15: 15 [IU] via SUBCUTANEOUS

## 2022-08-15 MED ORDER — ATORVASTATIN CALCIUM 40 MG PO TABS
40.0000 mg | ORAL_TABLET | Freq: Every day | ORAL | Status: DC
  Administered 2022-08-15 – 2022-08-25 (×11): 40 mg via ORAL
  Filled 2022-08-15 (×12): qty 1

## 2022-08-15 MED ORDER — SODIUM CHLORIDE 0.9 % IV SOLN
2.0000 g | Freq: Two times a day (BID) | INTRAVENOUS | Status: AC
  Administered 2022-08-15 – 2022-08-20 (×12): 2 g via INTRAVENOUS
  Filled 2022-08-15 (×12): qty 12.5

## 2022-08-15 MED ORDER — INSULIN GLARGINE-YFGN 100 UNIT/ML ~~LOC~~ SOLN
10.0000 [IU] | Freq: Every day | SUBCUTANEOUS | Status: DC
  Administered 2022-08-15 – 2022-08-18 (×4): 10 [IU] via SUBCUTANEOUS
  Filled 2022-08-15 (×5): qty 0.1

## 2022-08-15 MED ORDER — INSULIN ASPART 100 UNIT/ML IJ SOLN
0.0000 [IU] | Freq: Every day | INTRAMUSCULAR | Status: DC
  Administered 2022-08-15 – 2022-08-17 (×3): 2 [IU] via SUBCUTANEOUS
  Administered 2022-08-18 – 2022-08-19 (×2): 3 [IU] via SUBCUTANEOUS
  Administered 2022-08-20: 4 [IU] via SUBCUTANEOUS
  Administered 2022-08-21: 3 [IU] via SUBCUTANEOUS

## 2022-08-15 MED ORDER — PROCHLORPERAZINE EDISYLATE 10 MG/2ML IJ SOLN
10.0000 mg | Freq: Four times a day (QID) | INTRAMUSCULAR | Status: DC | PRN
  Administered 2022-08-15 – 2022-08-17 (×2): 10 mg via INTRAVENOUS
  Filled 2022-08-15 (×2): qty 2

## 2022-08-15 MED ORDER — FUROSEMIDE 10 MG/ML IJ SOLN
20.0000 mg | Freq: Two times a day (BID) | INTRAMUSCULAR | Status: DC
  Administered 2022-08-15 (×3): 20 mg via INTRAVENOUS
  Filled 2022-08-15 (×3): qty 2

## 2022-08-15 NOTE — Progress Notes (Signed)
Pharmacy Antibiotic Note  Debra Ryan is a 81 y.o. female admitted on 08/14/2022 presenting with SOB, concern for pna.  Pharmacy has been consulted for vancomycin & Cefepime dosing.    Plan: Cefepime 2gm IV q12h Vancomycin 1500 mg IV x 1, then 750 mg IV q 24h (eAUC 403) Add MRSA PCR Monitor renal function, Cx/PCR to narrow Vancomycin levels as needed  Height: '5\' 2"'$  (157.5 cm) Weight: 111.1 kg (245 lb) IBW/kg (Calculated) : 50.1  Temp (24hrs), Avg:98.1 F (36.7 C), Min:97.4 F (36.3 C), Max:98.6 F (37 C)  Recent Labs  Lab 08/14/22 1643 08/14/22 1725  WBC 4.3  --   CREATININE 1.03*  --   LATICACIDVEN  --  1.2     Estimated Creatinine Clearance: 52.1 mL/min (A) (by C-G formula based on SCr of 1.03 mg/dL (H)).    Allergies  Allergen Reactions   Clindamycin/Lincomycin Nausea And Vomiting   Sulfa Antibiotics Hives    Netta Cedars, PharmD, BCPS 08/15/2022 1:46 AM

## 2022-08-15 NOTE — Progress Notes (Signed)
PROGRESS NOTE    Debra Ryan  HEN:[id number] DOB: 1941-03-23 DOA: 08/14/2022 PCP: Thomes Dinning, MD    Brief Narrative:   Debra Ryan is a 81 y.o. 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  Patient recently admitted to [facility] 9/22-9/28 for pneumonia decompensated CHF, hypoglycemia, and UTI.  She was discharged to SNF and returned home 2 days ago.  Patient started having shortness of breath, wheezing, nonproductive cough day prior to her admission.  Currently requesting her home pain medication for chronic back pain issues in which she takes OxyContin 50 mg every 12 hours.  She states she does not have her insulin pump on her as her daughter took it away while she was in the emergency department.  No other complaints or concerns at this time.  In the ED, temperature 97.4 F, HR 92, RR 22, BP 135/90, SPO2 89% on room air and placed on 2 L nasal cannula.  WBC 4.3, hemoglobin 10.7, platelet count 227.  Sodium 140, potassium 4.1, chloride 108, CO2 26, glucose 185.  BUN 9, creatinine 1.03.  BNP 470.5.  High sensitive troponin 7.  Lactic acid 1.2.  COVID-19 PCR negative.  Chest x-ray with bronchial wall thickening with mild patchy opacities in the lung bases suggestive of bronchitis and possible bronchopneumonia, bilateral interstitial thickening consistent with mild component of congestive heart failure.  Patient was given bronchodilator treatments, Coreg, vancomycin and cefepime.  EDP consulted [facility] for  admission and patient was transferred to [facility] for further evaluation and treatment.  Assessment & Plan:   Bronchopneumonia Chest x-ray with changes suggestive of bronchitis/ bronchopneumonia.  No fever, leukocytosis, or lactic acidosis.  No signs of sepsis at this time.  SARS-CoV-2 PCR negative.  Procalcitonin less than 0.10. --MRSA PCR: Pending --Blood cultures x2: Pending --Vancomycin, pharmacy consulted for dosing/monitoring; if MRSA PCR negative will discontinue --Cefepime 2 g IV every 12 hours --Mucinex 3 mg p.o. twice daily --Albuterol neb as needed for wheezing -- Incentive spirometry, flutter valve   Acute on chronic diastolic CHF Appears volume overloaded on exam.  BNP 470 and chest x-ray showing mild CHF.  Echo done at [facility] on 07/23/2022 showing EF 50 to 55%. --Furosemide 20 mg IV q12h --Strict I's and O's and daily weights   Acute hypoxemic respiratory failure Due to problems listed above.  Oxygen saturation 89% on room air, currently stable on 2 L O2. --Continue supplemental oxygen, wean as tolerated; goal >/= 92% --Ambulatory O2 screen in the a.m.   Normocytic anemia Hemoglobin 10.7, stable compared to labs done last month (Care Everywhere). No signs of active bleeding --Repeat CBC in the a.m.   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  Continue outpatient oncology follow-up   Type 1 diabetes with hyperglycemia Patient does not have her insulin pump with her at this time.  Records from recent hospitalization at [facility] reviewed, she receives 20 units of basal insulin through pump and additional bolus doses at mealtime depending on her blood glucose.  She was admitted for hypoglycemia and most hypoglycemic episodes were occurring at night.  Insulin  dose was adjusted to Lantus 10 units in the morning and 5 units in the evening during that hospitalization. --Hemoglobin A1c 8.8, not optimally controlled --Semglee 10  units in the morning and 5 units in the evening --Sensitive sliding scale insulin ACHS --CBG checks ACHS and 3 am   Chronic pain --Patient takes OxyContin 15 mg every 12 hours, continue home medication   OSA --Continue nightly CPAP   CKD stage IIIa Creatinine 1.0, stable. --BMP daily   Paroxysmal A-fib:  -- Carvedilol 12.5 mg p.o. twice daily -- Xarelto  CAD with stents:  Work-up not suggestive of ACS.  Patient denies chest pain.  Continue aspirin and statin  Hypertension:  -- Carvedilol 12.5 mg p.o. twice daily -- Furosemide as above -- Hold home lisinopril for now -- Continue monitor BP closely  Hyperlipidemia -- Atorvastatin 40 mg p.o. daily  Hypothyroidism --Levothyroxine 200 mcg p.o. daily  GERD: Continue PPI   DVT prophylaxis:  Rivaroxaban (XARELTO) tablet 15 mg    Code Status: DNR Family Communication: No family present at bedside this morning  Disposition Plan:  Level of care: Telemetry Status is: Inpatient Remains inpatient appropriate because: IV diuresis    Consultants:  None  Procedures:  None  Antimicrobials:  Vancomycin 10/15>> Cefepime 10/15>>   Subjective: Patient seen examined bedside, resting comfortably.  Continues with some nausea and shortness of breath.  No family present.  No other complaints/concerns or questions at this time.  Denies headache, no dizziness, no chest pain, palpitations, no abdominal pain, no cough/congestion, no focal weakness, no fever/chills/night sweats, no nausea/vomiting/diarrhea, no paresthesias.  No acute events overnight per nursing staff.  Objective: Vitals:   08/15/22 0311 08/15/22 0649 08/15/22 0801 08/15/22 1150  BP: (!) 142/76 (!) 116/50 106/65 (!) 130/91  Pulse: 96 (!) 108 (!) 107 (!) 105  Resp: (!) '21 20 18   '$ Temp: 98.4 F (36.9 C) 97.7 F (36.5 C)    TempSrc:  Oral    SpO2: 94% 95%    Weight:      Height:        Intake/Output Summary (Last 24 hours) at 08/15/2022 1216 Last data filed  at 08/15/2022 0539 Gross per 24 hour  Intake 500 ml  Output 650 ml  Net -150 ml   Filed Weights   08/14/22 1613  Weight: 111.1 kg    Examination:  Physical Exam: GEN: NAD, alert and oriented x 3, elderly in appearance HEENT: NCAT, PERRL, EOMI, sclera clear, MMM PULM: Breath sounds slightly diminished bilateral bases, no wheezes/crackles, normal respiratory effort without accessory muscle use, on 2 L nasal cannula with SPO2 95% at rest CV: RRR w/o M/G/R GI: abd soft, NTND, NABS, no R/G/M MSK: Trace-1+ pitting edema bilateral lower extremities, moves all extremities independently NEURO: CN II-XII intact, no focal deficits, sensation to light touch intact PSYCH: normal mood/affect Integumentary: dry/intact, no rashes or wounds    Data Reviewed: I have personally reviewed following labs and imaging studies  CBC: Recent Labs  Lab 08/14/22 1643 08/15/22 0452  WBC 4.3 5.4  NEUTROABS 3.1  --   HGB 10.7* 10.2*  HCT 33.1* 32.6*  MCV 95.4 97.6  PLT 227 742   Basic Metabolic Panel: Recent Labs  Lab 08/14/22 1643 08/15/22 0452  NA 140 137  K 4.1 4.8  CL 108 107  CO2 26 21*  GLUCOSE 185* 430*  BUN 9 13  CREATININE 1.03* 1.08*  CALCIUM 8.3* 8.6*   GFR: Estimated Creatinine Clearance: 49.7 mL/min (A) (by C-G formula  based on SCr of 1.08 mg/dL (H)). Liver Function Tests: Recent Labs  Lab 08/15/22 0452  AST 24  ALT 16  ALKPHOS 106  BILITOT 1.1  PROT 5.9*  ALBUMIN 3.2*   No results for input(s): "LIPASE", "AMYLASE" in the last 168 hours. No results for input(s): "AMMONIA" in the last 168 hours. Coagulation Profile: No results for input(s): "INR", "PROTIME" in the last 168 hours. Cardiac Enzymes: No results for input(s): "CKTOTAL", "CKMB", "CKMBINDEX", "TROPONINI" in the last 168 hours. BNP (last 3 results) No results for input(s): "PROBNP" in the last 8760 hours. HbA1C: Recent Labs    08/15/22 0452  HGBA1C 8.8*   CBG: Recent Labs  Lab 08/14/22 1941  08/15/22 0308 08/15/22 0743 08/15/22 1135  GLUCAP 146* 377* 436* 353*   Lipid Profile: No results for input(s): "CHOL", "HDL", "LDLCALC", "TRIG", "CHOLHDL", "LDLDIRECT" in the last 72 hours. Thyroid Function Tests: No results for input(s): "TSH", "T4TOTAL", "FREET4", "T3FREE", "THYROIDAB" in the last 72 hours. Anemia Panel: No results for input(s): "VITAMINB12", "FOLATE", "FERRITIN", "TIBC", "IRON", "RETICCTPCT" in the last 72 hours. Sepsis Labs: Recent Labs  Lab 08/14/22 1725 08/15/22 0452  PROCALCITON  --  <0.10  LATICACIDVEN 1.2  --     Recent Results (from the past 240 hour(s))  Blood culture (routine x 2)     Status: None (Preliminary result)   Collection Time: 08/14/22  4:44 PM   Specimen: Right Antecubital; Blood  Result Value Ref Range Status   Specimen Description   Final    RIGHT ANTECUBITAL BLOOD Performed at [facility], [address] [place], [place] [address]    Special Requests   Final    Blood Culture adequate volume BOTTLES DRAWN AEROBIC AND ANAEROBIC Performed at [facility], [address] [place], [place] [address]    Culture   Final    NO GROWTH < 12 HOURS Performed at [facility], [place] [address], [place], [place] [address]    Report Status PENDING  Incomplete  SARS Coronavirus 2 by RT PCR (hospital order, performed in [facility] hospital lab) *cepheid single result test* Anterior Nasal Swab     Status: None   Collection Time: 08/14/22  5:25 PM   Specimen: Anterior Nasal Swab  Result Value Ref Range Status   SARS Coronavirus 2 by RT PCR NEGATIVE NEGATIVE Final    Comment: (NOTE) SARS-CoV-2 target nucleic acids are NOT DETECTED.  The SARS-CoV-2 RNA is generally detectable in upper and lower respiratory specimens during the acute phase of infection. The lowest concentration of SARS-CoV-2 viral copies this assay can detect is 250 copies / mL. A negative result does not preclude SARS-CoV-2 infection and should not  be used as the sole basis for treatment or other patient management decisions.  A negative result may occur with improper specimen collection / handling, submission of specimen other than nasopharyngeal swab, presence of viral mutation(s) within the areas targeted by this assay, and inadequate number of viral copies (<250 copies / mL). A negative result must be combined with clinical observations, patient history, and epidemiological information.  Fact Sheet for Patients:   https://www.patel.info/  Fact Sheet for Healthcare Providers: https://hall.com/  This test is not yet approved or  cleared by the [place] FDA and has been authorized for detection and/or diagnosis of SARS-CoV-2 by FDA under an Emergency Use Authorization (EUA).  This EUA will remain in effect (meaning this test can be used) for the duration of the COVID-19  declaration under Section 564(b)(1) of the Act, 21 U.S.C. section 360bbb-3(b)(1), unless the authorization is terminated or revoked sooner.  Performed at [facility], [address] [place], [place] [address]   Blood culture (routine x 2)     Status: None (Preliminary result)   Collection Time: 08/14/22  5:43 PM   Specimen: Left Antecubital; Blood  Result Value Ref Range Status   Specimen Description   Final    LEFT ANTECUBITAL BLOOD Performed at [facility], [address] [place], [place] [address]    Special Requests   Final    Blood Culture adequate volume BOTTLES DRAWN AEROBIC AND ANAEROBIC Performed at [facility], [address] [place], [place] [address]    Culture   Final    NO GROWTH < 12 HOURS Performed at [facility], [place] [address] [place],  [address]    Report Status PENDING  Incomplete         Radiology Studies: DG Chest 2 View  Result Date: 08/14/2022 CLINICAL DATA:  Cough. EXAM: CHEST - 2 VIEW COMPARISON:  07/24/2022.  FINDINGS: Cardiac silhouette is enlarged, but stable. No mediastinal or hilar masses. No convincing adenopathy. Mild bilateral interstitial thickening most evident in the lower lungs. Lower lobe bronchial wall thickening with mild patchy airspace opacities in the lower lobes. Subtle nodular opacities noted in upper lungs. No pleural effusion.  No pneumothorax. Skeletal structures are intact. IMPRESSION: 1. Bronchial wall thickening with mild patchy opacities in the lung bases suggesting bronchitis possible bronchopneumonia. 2. Bilateral interstitial thickening, less notable than on prior exams. Cannot exclude a mild component congestive heart failure. Small nodular opacities reflect nodules better seen on the CT from 07/22/2022. Electronically Signed   By: Lajean Manes M.D.   On: 08/14/2022 16:47        Scheduled Meds:  aspirin  81 mg Oral Daily   atorvastatin  40 mg Oral Daily   carvedilol  12.5 mg Oral BID WC   furosemide  20 mg Intravenous BID   guaiFENesin  600 mg Oral BID   insulin aspart  0-5 Units Subcutaneous QHS   insulin aspart  0-9 Units Subcutaneous TID WC   insulin glargine-yfgn  10 Units Subcutaneous Daily   insulin glargine-yfgn  5 Units Subcutaneous QHS   [START ON 08/16/2022] levothyroxine  200 mcg Oral QAC breakfast   oxyCODONE  15 mg Oral Q12H   pantoprazole  80 mg Oral Daily   Rivaroxaban  15 mg Oral Q supper   Continuous Infusions:  ceFEPime (MAXIPIME) IV 2 g (08/15/22 0220)   vancomycin       LOS: 1 day    Time spent: 50 minutes spent on chart review, discussion with nursing staff, consultants, updating family and interview/physical exam; more than 50% of that time was spent in counseling and/or coordination of care.    Riti Rollyson J [place] DO [facility] Available via Epic secure chat 7am-7pm After these hours, please refer to coverage provider listed on [email or web address] 08/15/2022, 12:16 PM

## 2022-08-15 NOTE — Progress Notes (Signed)
Mobility Specialist - Progress Note   08/15/22 1152  Mobility  Activity Ambulated with assistance in hallway  Level of Assistance Modified independent, requires aide device or extra time  Assistive Device Front wheel walker  Distance Ambulated (ft) 30 ft  Activity Response Tolerated well  Mobility Referral Yes  $Mobility charge 1 Mobility   Pt received in bed and agreed to mobility after some convincing. Pt felt SOB and fatigued during ambulation, nearing EOS pt got nauseas. Pt back to chair with all needs met and staff in room.  Roderick Pee Mobility Specialist

## 2022-08-15 NOTE — Progress Notes (Signed)
Pt instructed on use of Flutter Valve.  Pt demonstrated with good effort and technique.  

## 2022-08-15 NOTE — TOC Initial Note (Signed)
Transition of Care [facility]) - Initial/Assessment Note    Patient Details  Name: Debra Ryan MRN: [id number] Date of Birth: 11-Aug-1941  [facility]) CM/SW Contact:    Vassie Moselle, LCSW Phone Number: 08/15/2022, 1:53 PM  Clinical Narrative:                 TOC consulted for heart failure home health screening. Per pt she is able to check her BP, HR, and pulse at home. She states she checks these and her weight on a daily basis. She says she also tries to keep an eye out for swelling and shortness of breath and tries to eat a heart healthy diet.  Pt declines need for heart failure home health at this time.  TOC will continue to follow for further recommendations and discharge planning needs.   Expected Discharge Plan: Home/Self Care Barriers to Discharge: Continued Medical Work up   Patient Goals and CMS Choice Patient states their goals for this hospitalization and ongoing recovery are:: To return home CMS Medicare.gov Compare Post Acute Care list provided to:: Patient Choice offered to / list presented to : Patient  Expected Discharge Plan and Services Expected Discharge Plan: Home/Self Care In-house Referral: NA Discharge Planning Services: CM Consult Post Acute Care Choice: NA Living arrangements for the past 2 months: Single Family Home                                      Prior Living Arrangements/Services Living arrangements for the past 2 months: Single Family Home Lives with:: Spouse Patient language and need for interpreter reviewed:: Yes Do you feel safe going back to the place where you live?: Yes      Need for Family Participation in Patient Care: No (Comment) Care giver support system in place?: No (comment) Current home services: DME Criminal Activity/Legal Involvement Pertinent to Current Situation/Hospitalization: No - Comment as needed  Activities of Daily Living Home Assistive Devices/Equipment: Gilford Rile (specify type) ADL  Screening (condition at time of admission) Patient's cognitive ability adequate to safely complete daily activities?: No Is the patient deaf or have difficulty hearing?: No Does the patient have difficulty seeing, even when wearing glasses/contacts?: No Does the patient have difficulty concentrating, remembering, or making decisions?: No Patient able to express need for assistance with ADLs?: No Does the patient have difficulty dressing or bathing?: No Independently performs ADLs?: Yes (appropriate for developmental age) Communication: Independent Dressing (OT): Independent Grooming: Independent Feeding: Independent Bathing: Independent Toileting: Independent In/Out Bed: Independent Walks in Home: Independent Does the patient have difficulty walking or climbing stairs?: No Weakness of Legs: None Weakness of Arms/Hands: None  Permission Sought/Granted   Permission granted to share information with : No              Emotional Assessment Appearance:: Appears stated age Attitude/Demeanor/Rapport: Engaged Affect (typically observed): Accepting Orientation: : Oriented to Self, Oriented to Place, Oriented to  Time, Oriented to Situation Alcohol / Substance Use: Not Applicable Psych Involvement: No (comment)  Admission diagnosis:  Acute respiratory failure with hypoxia (HCC) [J96.01] HAP (hospital-acquired pneumonia) [J18.9, Y95] Patient Active Problem List   Diagnosis Date Noted   Bronchopneumonia 08/15/2022   Acute on chronic diastolic CHF (congestive heart failure) ([place]) 08/15/2022   Normocytic anemia 08/15/2022   Type 1 diabetes ([place]) 08/15/2022   Chronic pain 08/15/2022   OSA (obstructive sleep apnea) 08/15/2022   CKD (  chronic kidney disease) stage 3, GFR 30-59 ml/min (HCC) 08/15/2022   Paroxysmal atrial fibrillation ([place]) 08/15/2022   CAD (coronary artery disease) 08/15/2022   HTN (hypertension) 08/15/2022   HLD (hyperlipidemia) 08/15/2022   Hypothyroidism 08/15/2022    Acute respiratory failure with hypoxia (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

## 2022-08-15 NOTE — H&P (Signed)
History and Physical    Debra Ryan MPN:[id number] DOB: [birth year] DOA: 08/14/2022  PCP: Thomes Dinning, MD  Patient coming from: [facility] ED  Chief Complaint: Shortness of breath  HPI: Debra Ryan is a 81 y.o. 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  Recently admitted to [facility] 9/22-9/28 for pneumonia decompensated CHF, hypoglycemia, and UTI.  She was discharged to SNF and returned home 2 days ago.    Patient presented to [facility] ED today with shortness of breath, chest pain, cough, wheezing, and lower extremity edema.  Oxygen saturation 89% on room air, improved with 2 L O2.  Afebrile.  Labs showing no leukocytosis, hemoglobin 10.7 with MCV 95.4, creatinine 1.0, BNP 470, high-sensitivity troponin negative x2, blood cultures drawn, SARS-CoV-2 PCR negative, lactic acid normal.  Chest x-ray with changes suggestive of bronchitis/ bronchopneumonia and mild CHF. Patient was given bronchodilator treatments, Coreg, vancomycin, and cefepime.  Patient states she was in rehab and went home 2 days ago.  She started having shortness of breath, wheezing, and nonproductive cough yesterday.  She denies any chest pain or fevers.  She is requesting her home pain medication for chronic back pain (takes OxyContin 15 mg every 12 hours).  Patient states she does not have her insulin pump on as her daughter took it away while she was in the emergency room.  No other complaints.  Review of Systems:  Review of Systems  All other systems reviewed and are negative.   Past Medical History:  Diagnosis Date   Arthritis    Atrial fibrillation  (HCC)    Chronic pain    takes Opana daily and Percocet daily as needed   Colitis    Constipation    takes Carafate and Miralax daily   Coronary artery disease    Diabetes mellitus    has an Insulin Pump   DVT (deep venous thrombosis) (HCC)    Gastroparesis    GERD (gastroesophageal reflux disease)    High cholesterol    takes Crestor daily   History of blood clots    takes Xarelto and Brilinta daily   Hypertension    takes Coreg and Lisinopril daily   Hypothyroidism    takes Synthroid daily   MI (myocardial infarction) ([place])    Muscle spasm    takes Tizanidine daily   Neuropathy    Pulmonary embolism (HCC)     Past Surgical History:  Procedure Laterality Date   ABDOMINAL HYSTERECTOMY     ANKLE SURGERY     cardiac stents     CARDIAC SURGERY     CARDIAC SURGERY     cateract surgery     JOINT REPLACEMENT     bilateral knees   TONSILLECTOMY     WRIST SURGERY       reports that she quit smoking about 51 years ago. Her smoking use included cigarettes. She has a 7.50 pack-year smoking history. She has never used smokeless tobacco. She reports that she does not drink alcohol and does not use drugs.  Allergies  Allergen Reactions   Clindamycin/Lincomycin Nausea And Vomiting   Sulfa Antibiotics Hives    Family History  Problem Relation Age of Onset   Cancer Mother 43  breast cancer   Cancer Brother 43       prostate cancer    Prior to Admission medications   Medication Sig Start Date End Date Taking? Authorizing Provider  Armodafinil (NUVIGIL) 250 MG tablet Take 250 mg by mouth daily.    [provider]  Ascorbic Acid (VITAMIN C PO) Take 1 tablet by mouth daily.    [provider]  aspirin 81 MG tablet Take 81 mg by mouth daily.    [provider]  atorvastatin (LIPITOR) 40 MG tablet Take 40 mg by mouth daily.    [provider]  Carvedilol (COREG PO) Take 6 mg by mouth.     [provider]  furosemide (LASIX) 20  MG tablet Take 20 mg by mouth daily.     [provider]  Insulin Human (INSULIN PUMP) 100 unit/ml SOLN Inject 22.15 each into the skin 4 (four) times daily. Insulin pump:  @ midnight .09 units of insulin, @ 3:30 am 1.0 units of insulin, @ 9 am .80 units of insulin, @ 6 pm 1.05 units of insulin.    [provider]  levothyroxine (SYNTHROID, LEVOTHROID) 112 MCG tablet Take 200 mcg by mouth daily.     [provider]  lisinopril (PRINIVIL,ZESTRIL) 20 MG tablet Take 20 mg by mouth daily.    [provider]  Mesalamine (ASACOL HD) 800 MG TBEC Take 1 tablet by mouth daily.    [provider]  modafinil (PROVIGIL) 200 MG tablet Take 200 mg by mouth daily.    [provider]  Multiple Vitamins-Minerals (MULTIVITAMIN PO) Take 1 tablet by mouth daily.    [provider]  omeprazole (PRILOSEC) 40 MG capsule Take 40 mg by mouth daily.    [provider]  oxyCODONE (OXYCONTIN) 15 mg 12 hr tablet Take 15 mg by mouth every 12 (twelve) hours.    [provider]  oxyCODONE (ROXICODONE) 15 MG immediate release tablet Take 15 mg by mouth every 4 (four) hours as needed for pain.    [provider]  oxyCODONE-acetaminophen (PERCOCET) 5-325 MG per tablet Take 1 tablet by mouth every 4 (four) hours as needed. Patient uses this medication for pain.    [provider]  polyethylene glycol (MIRALAX / GLYCOLAX) packet Take 17 g by mouth daily.    [provider]  Rivaroxaban (XARELTO) 15 MG TABS tablet Take 15 mg by mouth daily with supper.    [provider]  rosuvastatin (CRESTOR) 5 MG tablet Take 5 mg by mouth daily. Patient takes 2.5 mg of this medication every night, except for on Wednesday and Friday.  Then she takes the 5 mg tablet.    [provider]  sucralfate (CARAFATE) 1 G tablet Take 1 g by mouth 2 (two) times daily.    [provider]  Ticagrelor (BRILINTA PO) Take 90 mg by mouth.     [provider]  urea (CARMOL) 40 % CREA Apply 1 application topically daily. Patient applies this medication to her feet.    [provider]  vitamin E 100 UNIT capsule Take 100 Units by mouth daily.    [provider]    Physical Exam: Vitals:   08/14/22 2052 08/14/22 2145 08/14/22 2200 08/14/22 2257  BP:  (!) 148/124 127/72 (!) 152/92  Pulse:  (!) 113 98 (!) 109  Resp:  (!) '23 13 20  '$ Temp:  97.7 F (36.5 C)  98.6 F (37 C)  TempSrc:  Oral  SpO2: 94% 98% 96% 94%  Weight:      Height:        Physical Exam Vitals reviewed.  Constitutional:      General: She is not in acute distress. HENT:     Head: Normocephalic and atraumatic.  Eyes:     Extraocular Movements: Extraocular movements intact.  Cardiovascular:     Rate and Rhythm: Normal rate. Rhythm irregular.     Pulses: Normal pulses.  Pulmonary:     Effort: Pulmonary effort is normal. No respiratory distress.     Breath sounds: Wheezing present.  Abdominal:     General: Bowel sounds are normal. There is no distension.     Palpations: Abdomen is soft.     Tenderness: There is no abdominal tenderness.  Musculoskeletal:     Cervical back: Normal range of motion.     Right lower leg: Edema present.     Left lower leg: Edema present.     Comments: +2 pitting edema of bilateral lower legs  Skin:    General: Skin is warm and dry.  Neurological:     General: No focal deficit present.     Mental Status: She is alert and oriented to person, place, and time.     Labs on Admission: I have personally reviewed following labs and imaging studies  CBC: Recent Labs  Lab 08/14/22 1643  WBC 4.3  NEUTROABS 3.1  HGB 10.7*  HCT 33.1*  MCV 95.4  PLT 950   Basic Metabolic Panel: Recent Labs  Lab 08/14/22 1643  NA 140  K 4.1  CL 108  CO2 26  GLUCOSE 185*  BUN 9  CREATININE 1.03*  CALCIUM 8.3*   GFR: Estimated Creatinine Clearance: 52.1 mL/min (A) (by C-G formula based on SCr of  1.03 mg/dL (H)). Liver Function Tests: No results for input(s): "AST", "ALT", "ALKPHOS", "BILITOT", "PROT", "ALBUMIN" in the last 168 hours. No results for input(s): "LIPASE", "AMYLASE" in the last 168 hours. No results for input(s): "AMMONIA" in the last 168 hours. Coagulation Profile: No results for input(s): "INR", "PROTIME" in the last 168 hours. Cardiac Enzymes: No results for input(s): "CKTOTAL", "CKMB", "CKMBINDEX", "TROPONINI" in the last 168 hours. BNP (last 3 results) No results for input(s): "PROBNP" in the last 8760 hours. HbA1C: No results for input(s): "HGBA1C" in the last 72 hours. CBG: Recent Labs  Lab 08/14/22 1941  GLUCAP 146*   Lipid Profile: No results for input(s): "CHOL", "HDL", "LDLCALC", "TRIG", "CHOLHDL", "LDLDIRECT" in the last 72 hours. Thyroid Function Tests: No results for input(s): "TSH", "T4TOTAL", "FREET4", "T3FREE", "THYROIDAB" in the last 72 hours. Anemia Panel: No results for input(s): "VITAMINB12", "FOLATE", "FERRITIN", "TIBC", "IRON", "RETICCTPCT" in the last 72 hours. Urine analysis:    Component Value Date/Time   COLORURINE YELLOW 08/09/2019 2008   APPEARANCEUR HAZY (A) 08/09/2019 2008   LABSPEC >1.030 (H) 08/09/2019 2008   PHURINE 5.5 08/09/2019 2008   GLUCOSEU NEGATIVE 08/09/2019 2008   HGBUR NEGATIVE 08/09/2019 2008   BILIRUBINUR SMALL (A) 08/09/2019 2008   KETONESUR 15 (A) 08/09/2019 2008   PROTEINUR 100 (A) 08/09/2019 2008   UROBILINOGEN 0.2 10/05/2013 1227   NITRITE NEGATIVE 08/09/2019 2008   LEUKOCYTESUR NEGATIVE 08/09/2019 2008    Radiological Exams on Admission: DG Chest 2 View  Result Date: 08/14/2022 CLINICAL DATA:  Cough. EXAM: CHEST - 2 VIEW COMPARISON:  07/24/2022. FINDINGS: Cardiac silhouette is enlarged, but stable. No mediastinal or hilar masses. No convincing adenopathy. Mild bilateral interstitial thickening most evident in the  lower lungs. Lower lobe bronchial wall thickening with mild patchy airspace opacities  in the lower lobes. Subtle nodular opacities noted in upper lungs. No pleural effusion.  No pneumothorax. Skeletal structures are intact. IMPRESSION: 1. Bronchial wall thickening with mild patchy opacities in the lung bases suggesting bronchitis possible bronchopneumonia. 2. Bilateral interstitial thickening, less notable than on prior exams. Cannot exclude a mild component congestive heart failure. Small nodular opacities reflect nodules better seen on the CT from 07/22/2022. Electronically Signed   By: Lajean Manes M.D.   On: 08/14/2022 16:47    EKG: Independently reviewed.  Rate controlled A-fib.  Assessment and Plan  Bronchopneumonia Chest x-ray with changes suggestive of bronchitis/ bronchopneumonia.  No fever, leukocytosis, or lactic acidosis.  No signs of sepsis at this time.  SARS-CoV-2 PCR negative. -Continue vancomycin and cefepime -Scheduled Mucinex -Albuterol neb as needed for wheezing -Pulmonary hygiene -MRSA PCR screen -Blood cultures pending -Check procalcitonin  Acute on chronic diastolic CHF Appears volume overloaded on exam.  BNP 470 and chest x-ray showing mild CHF.  Echo done at [facility] on 07/23/2022 showing EF 50 to 55%. -IV Lasix 20 mg twice daily -Monitor intake and output -Daily weights -Low-sodium diet with fluid restriction  Acute hypoxemic respiratory failure Due to problems listed above.  Oxygen saturation 89% on room air, currently stable on 2 L O2. -Continue supplemental oxygen, wean as tolerated  Normocytic anemia Hemoglobin 10.7, stable compared to labs done last month (Care Everywhere).  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 -Outpatient oncology follow-up  Type 1 diabetes Patient does not have her insulin pump with her at this time.  Records from recent hospitalization at [facility] reviewed, she receives 20 units of basal insulin through pump and additional  bolus doses at mealtime depending on her blood glucose.  She was admitted for hypoglycemia and most hypoglycemic episodes were occurring at night.  Insulin dose was adjusted to Lantus 10 units in the morning and 5 units in the evening during that hospitalization. -Check A1c -Semglee 10 units in the morning and 5 units in the evening -Sensitive sliding scale insulin ACHS -CBG checks ACHS and 3 am  Chronic pain -Patient takes OxyContin 15 mg every 12 hours, continue home medication  OSA -Continue nightly CPAP  CKD stage IIIa Creatinine 1.0, stable. -Continue to monitor  Paroxysmal A-fib: Currently in A-fib but rate controlled. CAD with stents: Work-up not suggestive of ACS.  Patient denies chest pain.  Hypertension: Stable. Hyperlipidemia Hypothyroidism GERD -Continue home meds after pharmacy med rec is done.  DVT prophylaxis: Continue Xarelto after pharmacy med rec is done. Code Status: DNR (discussed with the patient) Family Communication: No family available at this time. Level of care: Telemetry bed Admission status: It is my clinical opinion that admission to INPATIENT is reasonable and necessary because of the expectation that this patient will require hospital care that crosses at least 2 midnights to treat this condition based on the medical complexity of the problems presented.  Given the aforementioned information, the predictability of an adverse outcome is felt to be significant.   Shela Leff MD [facility]  If 7PM-7AM, please contact night-coverage [email or web address]  08/15/2022, 12:49 AM

## 2022-08-16 DIAGNOSIS — J180 Bronchopneumonia, unspecified organism: Secondary | ICD-10-CM | POA: Diagnosis not present

## 2022-08-16 LAB — BASIC METABOLIC PANEL
Anion gap: 6 (ref 5–15)
BUN: 19 mg/dL (ref 8–23)
CO2: 26 mmol/L (ref 22–32)
Calcium: 8.6 mg/dL — ABNORMAL LOW (ref 8.9–10.3)
Chloride: 106 mmol/L (ref 98–111)
Creatinine, Ser: 1.7 mg/dL — ABNORMAL HIGH (ref 0.44–1.00)
GFR, Estimated: 30 mL/min — ABNORMAL LOW (ref >60)
Glucose, Bld: 177 mg/dL — ABNORMAL HIGH (ref 70–99)
Potassium: 4 mmol/L (ref 3.5–5.1)
Sodium: 138 mmol/L (ref 135–145)

## 2022-08-16 LAB — GLUCOSE, CAPILLARY
Glucose-Capillary: 168 mg/dL — ABNORMAL HIGH (ref 70–99)
Glucose-Capillary: 195 mg/dL — ABNORMAL HIGH (ref 70–99)
Glucose-Capillary: 214 mg/dL — ABNORMAL HIGH (ref 70–99)
Glucose-Capillary: 232 mg/dL — ABNORMAL HIGH (ref 70–99)
Glucose-Capillary: 250 mg/dL — ABNORMAL HIGH (ref 70–99)

## 2022-08-16 LAB — BRAIN NATRIURETIC PEPTIDE: B Natriuretic Peptide: 469.4 pg/mL — ABNORMAL HIGH (ref 0.0–100.0)

## 2022-08-16 MED ORDER — FUROSEMIDE 20 MG PO TABS
20.0000 mg | ORAL_TABLET | Freq: Every day | ORAL | Status: DC
  Administered 2022-08-16 – 2022-08-18 (×3): 20 mg via ORAL
  Filled 2022-08-16 (×3): qty 1

## 2022-08-16 MED ORDER — GUAIFENESIN-DM 100-10 MG/5ML PO SYRP
5.0000 mL | ORAL_SOLUTION | ORAL | Status: DC | PRN
  Administered 2022-08-16: 5 mL via ORAL
  Filled 2022-08-16: qty 5

## 2022-08-16 NOTE — Plan of Care (Signed)

## 2022-08-16 NOTE — Progress Notes (Signed)
Mobility Specialist - Progress Note   08/16/22 1336  Mobility  Activity Ambulated with assistance in hallway  Level of Assistance Contact guard assist, steadying assist  Assistive Device Front wheel walker  Distance Ambulated (ft) 200 ft  Activity Response Tolerated well  Mobility Referral Yes  $Mobility charge 1 Mobility   Pt received in chair and agreed for mobility, no c/o pain nor discomfort during ambulation. Pt got fatigued nearing EOS. Pt back to bed with all needs met.   Roderick Pee Mobility Specialist

## 2022-08-16 NOTE — Progress Notes (Signed)
PROGRESS NOTE    Debra Ryan  IHK:[id number] DOB: [birth year] DOA: 08/14/2022 PCP: Thomes Dinning, MD    Brief Narrative:   Debra Ryan is a 81 y.o. 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  Patient recently admitted to [facility] 9/22-9/28 for pneumonia decompensated CHF, hypoglycemia, and UTI.  She was discharged to SNF and returned home 2 days ago.  Patient started having shortness of breath, wheezing, nonproductive cough day prior to her admission.  Currently requesting her home pain medication for chronic back pain issues in which she takes OxyContin 50 mg every 12 hours.  She states she does not have her insulin pump on her as her daughter took it away while she was in the emergency department.  No other complaints or concerns at this time.  In the ED, temperature 97.4 F, HR 92, RR 22, BP 135/90, SPO2 89% on room air and placed on 2 L nasal cannula.  WBC 4.3, hemoglobin 10.7, platelet count 227.  Sodium 140, potassium 4.1, chloride 108, CO2 26, glucose 185.  BUN 9, creatinine 1.03.  BNP 470.5.  High sensitive troponin 7.  Lactic acid 1.2.  COVID-19 PCR negative.  Chest x-ray with bronchial wall thickening with mild patchy opacities in the lung bases suggestive of bronchitis and possible bronchopneumonia, bilateral interstitial thickening consistent with mild component of congestive heart failure.  Patient was given bronchodilator treatments, Coreg, vancomycin and cefepime.  EDP consulted [facility] for  admission and patient was transferred to [facility] for further evaluation and treatment.  Assessment & Plan:   Bronchopneumonia Chest x-ray with changes suggestive of bronchitis/ bronchopneumonia.  No fever, leukocytosis, or lactic acidosis.  No signs of sepsis at this time.  SARS-CoV-2 PCR negative.  Procalcitonin less than 0.10.  MRSA PCR negative, IV vancomycin discontinued. --Blood cultures x2: Pending --Cefepime 2 g IV every 12 hours --Mucinex 600 mg p.o. twice daily --Albuterol neb as needed for wheezing --Incentive spirometry, flutter valve   Acute on chronic diastolic CHF Appears volume overloaded on exam.  BNP 470 and chest x-ray showing mild CHF.  Echo done at [facility] on 07/23/2022 showing EF 50 to 55%. --Furosemide 20 mg p.o. daily --Strict I's and O's and daily weights   Acute hypoxemic respiratory failure Due to problems listed above.  Oxygen saturation 89% on room air, currently stable on 2 L O2. --Continue supplemental oxygen, wean as tolerated; goal >/= 92% --Ambulatory O2 screen in the a.m.   Normocytic anemia Hemoglobin 10.7, stable compared to labs done last month (Care Everywhere). No signs of active bleeding --Repeat CBC in the a.m.   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  Continue outpatient oncology follow-up   Type 1 diabetes with hyperglycemia Patient does not have her insulin pump with her at this time.  Records from recent hospitalization at [facility] reviewed, she receives 20 units of basal insulin through pump and additional bolus doses at mealtime depending on her blood glucose.  She was admitted for hypoglycemia and most hypoglycemic episodes were occurring at night.  Insulin dose was adjusted to Lantus 10 units in  the morning and 5 units in the evening during that hospitalization. --Hemoglobin A1c 8.8, not optimally controlled --Semglee 10 units in the morning and 5 units in the evening --Sensitive  sliding scale insulin ACHS --CBG checks ACHS and 3 am   Chronic pain --Patient takes OxyContin 15 mg every 12 hours, continue home medication   OSA --Continue nightly CPAP   CKD stage IIIa Creatinine 1.0, stable. --BMP daily   Paroxysmal A-fib:  -- Carvedilol 12.5 mg p.o. twice daily -- Xarelto  CAD with stents:  Work-up not suggestive of ACS.  Patient denies chest pain.  Continue aspirin and statin  Hypertension:  -- Carvedilol 12.5 mg p.o. twice daily -- Furosemide as above -- Hold home lisinopril for now -- Continue monitor BP closely  Hyperlipidemia -- Atorvastatin 40 mg p.o. daily  Hypothyroidism --Levothyroxine 200 mcg p.o. daily  GERD: Continue PPI  Morbid obesity Body mass index is 45.48 kg/m.  Discussed with patient needs for aggressive lifestyle changes/weight loss as this complicates all facets of care.  Outpatient follow-up with PCP.   DVT prophylaxis:  Rivaroxaban (XARELTO) tablet 15 mg    Code Status: DNR Family Communication: No family present at bedside this morning  Disposition Plan:  Level of care: Telemetry Status is: Inpatient Remains inpatient appropriate because: IV antibiotics, anticipate able to discharge home in 1-2 days if remains off of oxygen and no significant desaturations on ambulation.    Consultants:  None  Procedures:  None  Antimicrobials:  Vancomycin 10/15 - 10/16 Cefepime 10/15>>   Subjective: Patient seen examined bedside, resting comfortably.  Titrated off of oxygen at rest but RN noted desaturation to the mid 80s on ambulation this morning.  Remains on IV cefepime.  Will reattempt ambulatory O2 screen tomorrow.  No other complaints/concerns or questions at this time.  Denies headache, no dizziness, no chest pain, palpitations, no abdominal pain, no cough/congestion, no focal weakness, no fever/chills/night sweats, no nausea/vomiting/diarrhea, no paresthesias.  No acute events overnight per nursing  staff.  Objective: Vitals:   08/15/22 1700 08/15/22 1952 08/16/22 0437 08/16/22 0500  BP: (!) 123/57 (!) 117/51 111/65   Pulse: (!) 108 99 69   Resp: '16 14 16   '$ Temp: 98.2 F (36.8 C) 98.1 F (36.7 C) 97.9 F (36.6 C)   TempSrc: Oral Oral Oral   SpO2: 93% 98% 94%   Weight:    112.8 kg  Height:        Intake/Output Summary (Last 24 hours) at 08/16/2022 1147 Last data filed at 08/16/2022 0400 Gross per 24 hour  Intake 200 ml  Output 1050 ml  Net -850 ml   Filed Weights   08/14/22 1613 08/16/22 0500  Weight: 111.1 kg 112.8 kg    Examination:  Physical Exam: GEN: NAD, alert and oriented x 3, elderly in appearance HEENT: NCAT, PERRL, EOMI, sclera clear, MMM PULM: Breath sounds slightly diminished bilateral bases, no wheezes/crackles, normal respiratory effort without accessory muscle use, on room air at rest with SPO2 94%  CV: RRR w/o M/G/R GI: abd soft, NTND, NABS, no R/G/M MSK: Trace-1+ pitting edema bilateral lower extremities, moves all extremities independently NEURO: CN II-XII intact, no focal deficits, sensation to light touch intact PSYCH: normal mood/affect Integumentary: dry/intact, no rashes or wounds    Data Reviewed: I have personally reviewed following labs and imaging studies  CBC: Recent Labs  Lab 08/14/22 1643 08/15/22 0452  WBC 4.3 5.4  NEUTROABS 3.1  --   HGB 10.7* 10.2*  HCT 33.1* 32.6*  MCV 95.4 97.6  PLT 227 191   Basic Metabolic Panel: Recent Labs  Lab 08/14/22 1643 08/15/22 0452 08/16/22 0533  NA 140 137 138  K 4.1 4.8 4.0  CL 108 107 106  CO2 26 21* 26  GLUCOSE 185* 430* 177*  BUN '9 13 19  '$ CREATININE 1.03* 1.08* 1.70*  CALCIUM 8.3* 8.6* 8.6*   GFR: Estimated Creatinine Clearance: 31.9 mL/min (A) (by C-G formula based on SCr of 1.7 mg/dL (H)). Liver Function Tests: Recent Labs  Lab 08/15/22 0452  AST 24  ALT 16  ALKPHOS 106  BILITOT 1.1  PROT 5.9*  ALBUMIN 3.2*   No results for input(s): "LIPASE", "AMYLASE" in  the last 168 hours. No results for input(s): "AMMONIA" in the last 168 hours. Coagulation Profile: No results for input(s): "INR", "PROTIME" in the last 168 hours. Cardiac Enzymes: No results for input(s): "CKTOTAL", "CKMB", "CKMBINDEX", "TROPONINI" in the last 168 hours. BNP (last 3 results) No results for input(s): "PROBNP" in the last 8760 hours. HbA1C: Recent Labs    08/15/22 0452  HGBA1C 8.8*   CBG: Recent Labs  Lab 08/15/22 1654 08/15/22 2134 08/16/22 0551 08/16/22 0733 08/16/22 1119  GLUCAP 346* 242* 168* 195* 214*   Lipid Profile: No results for input(s): "CHOL", "HDL", "LDLCALC", "TRIG", "CHOLHDL", "LDLDIRECT" in the last 72 hours. Thyroid Function Tests: No results for input(s): "TSH", "T4TOTAL", "FREET4", "T3FREE", "THYROIDAB" in the last 72 hours. Anemia Panel: No results for input(s): "VITAMINB12", "FOLATE", "FERRITIN", "TIBC", "IRON", "RETICCTPCT" in the last 72 hours. Sepsis Labs: Recent Labs  Lab 08/14/22 1725 08/15/22 0452  PROCALCITON  --  <0.10  LATICACIDVEN 1.2  --     Recent Results (from the past 240 hour(s))  Blood culture (routine x 2)     Status: None (Preliminary result)   Collection Time: 08/14/22  4:44 PM   Specimen: Right Antecubital; Blood  Result Value Ref Range Status   Specimen Description   Final    RIGHT ANTECUBITAL BLOOD Performed at [facility], [address] [place], [place] [address]    Special Requests   Final    Blood Culture adequate volume BOTTLES DRAWN AEROBIC AND ANAEROBIC Performed at [facility], [address], [place], [place] [address]    Culture   Final    NO GROWTH 2 DAYS Performed at [facility], [place] [address], [place], [place] [address]    Report Status PENDING  Incomplete  SARS Coronavirus 2 by RT PCR (hospital order, performed in [facility] hospital lab) *cepheid single result test* Anterior Nasal Swab     Status: None   Collection Time: 08/14/22  5:25 PM   Specimen:  Anterior Nasal Swab  Result Value Ref Range Status   SARS Coronavirus 2 by RT PCR NEGATIVE NEGATIVE Final    Comment: (NOTE) SARS-CoV-2 target nucleic acids are NOT DETECTED.  The SARS-CoV-2 RNA is generally detectable in upper and lower respiratory specimens during the acute phase of infection. The lowest concentration of SARS-CoV-2 viral copies this assay can detect is 250 copies / mL. A negative result does not preclude SARS-CoV-2 infection and should not be used as the sole basis for treatment or other patient management decisions.  A negative result may occur with improper specimen collection / handling, submission of specimen other than nasopharyngeal swab, presence of viral mutation(s) within the areas targeted by this assay, and inadequate number of viral copies (<250 copies / mL). A negative result must be  combined with clinical observations, patient history, and epidemiological information.  Fact Sheet for Patients:   https://www.patel.info/  Fact Sheet for Healthcare Providers: https://hall.com/  This test is not yet approved or  cleared by the [place] FDA and has been authorized for detection and/or diagnosis of SARS-CoV-2 by FDA under an Emergency Use Authorization (EUA).  This EUA will remain in effect (meaning this test can be used) for the duration of the COVID-19 declaration under Section 564(b)(1) of the Act, 21 U.S.C. section 360bbb-3(b)(1), unless the authorization is terminated or revoked sooner.  Performed at [facility], [address] [place], [place] [address]   MRSA Next Gen by PCR, Nasal     Status: None   Collection Time: 08/14/22  5:31 PM   Specimen: Nasal Mucosa; Nasal Swab  Result Value Ref Range Status   MRSA by PCR Next Gen NOT DETECTED NOT DETECTED Final    Comment: (NOTE) The GeneXpert MRSA Assay (FDA approved for NASAL specimens only), is one component of a comprehensive MRSA  colonization surveillance program. It is not intended to diagnose MRSA infection nor to guide or monitor treatment for MRSA infections. Test performance is not FDA approved in patients less than 38 years old. Performed at [facility], [place] [address], [place], [place] [address]   Blood culture (routine x 2)     Status: None (Preliminary result)   Collection Time: 08/14/22  5:43 PM   Specimen: Left Antecubital; Blood  Result Value Ref Range Status   Specimen Description   Final    LEFT ANTECUBITAL BLOOD Performed at [facility], [address] [place], [place] [address]    Special Requests   Final    Blood Culture adequate volume BOTTLES DRAWN AEROBIC AND ANAEROBIC Performed at [facility], [address] [place], [place] [address]    Culture   Final    NO GROWTH 2 DAYS Performed at [facility], [place] [address] [place], [place] [address]    Report Status PENDING  Incomplete         Radiology Studies: DG Chest 2 View  Result Date: 08/14/2022 CLINICAL DATA:  Cough. EXAM: CHEST - 2 VIEW COMPARISON:  07/24/2022. FINDINGS: Cardiac silhouette is enlarged, but stable. No mediastinal or hilar masses. No convincing adenopathy. Mild bilateral interstitial thickening most evident in the lower lungs. Lower lobe bronchial wall thickening with mild patchy airspace opacities in the lower lobes. Subtle nodular opacities noted in upper lungs. No pleural effusion.  No pneumothorax. Skeletal structures are intact. IMPRESSION: 1. Bronchial wall thickening with mild patchy opacities in the lung bases suggesting bronchitis possible bronchopneumonia. 2. Bilateral interstitial thickening, less notable than on prior exams. Cannot exclude a mild component congestive heart failure. Small nodular opacities reflect nodules better seen on the CT from 07/22/2022. Electronically Signed   By: Lajean Manes M.D.   On: 08/14/2022 16:47        Scheduled Meds:   aspirin  81 mg Oral Daily   atorvastatin  40 mg Oral Daily   carvedilol  12.5 mg Oral BID WC   furosemide  20 mg Oral Daily   guaiFENesin  600 mg Oral BID   insulin aspart  0-5 Units Subcutaneous QHS   insulin aspart  0-9 Units Subcutaneous TID WC   insulin glargine-yfgn  10 Units Subcutaneous Daily   insulin glargine-yfgn  5 Units Subcutaneous QHS   levothyroxine  200 mcg Oral QAC breakfast  oxyCODONE  15 mg Oral Q12H   pantoprazole  80 mg Oral Daily   Rivaroxaban  15 mg Oral Q supper   Continuous Infusions:  ceFEPime (MAXIPIME) IV 2 g (08/16/22 0110)     LOS: 2 days    Time spent: 50 minutes spent on chart review, discussion with nursing staff, consultants, updating family and interview/physical exam; more than 50% of that time was spent in counseling and/or coordination of care.    Carlicia Leavens J [place] DO [facility] Available via Epic secure chat 7am-7pm After these hours, please refer to coverage provider listed on [email or web address] 08/16/2022, 11:47 AM

## 2022-08-16 NOTE — Evaluation (Signed)
Physical Therapy Evaluation Patient Details Name: NASRA COUNCE MRN: [id number] DOB: 09/01/1941 Today's Date: 08/16/2022  History of Present Illness  CHRISTINE MORTON is a 81 y.o. female admitted with bronchopneumonia. 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   Clinical Impression  Pt admitted with above diagnosis. Pt from home with spouse and daughter lives close by, ind with rollator at baseline and since d/c from SNF recently. Pt ambulates with RW and supv, fatigues easily needing seated rest break to recover. Pt able to complete toileting with supv for safety. Pt on RA with SpO2 >92% throughout session. Recommend HHPT at d/c and pt in agreement. Pt currently with functional limitations due to the deficits listed below (see PT Problem List). Pt will benefit from skilled PT to increase their independence and safety with mobility to allow discharge to the venue listed below.          Recommendations for follow up therapy are one component of a multi-disciplinary discharge planning process, led by the attending physician.  Recommendations may be updated based on patient status, additional functional criteria and insurance authorization.  Follow Up Recommendations Home health PT      Assistance Recommended at Discharge PRN  Patient can return home with the following  Assistance with cooking/housework    Equipment Recommendations None recommended by PT  Recommendations for Other Services       Functional Status Assessment Patient has had a recent decline in their functional status and demonstrates the ability to make significant improvements in function in a reasonable and predictable amount of time.      Precautions / Restrictions Precautions Precautions: Fall Restrictions Weight Bearing Restrictions: No      Mobility  Bed Mobility Overal bed mobility: Modified Independent  General bed mobility comments: increased time, light use of bedrail to assist in scooting out to EOB    Transfers Overall transfer level: Needs assistance Equipment used: Rolling walker (2 wheels) Transfers: Sit to/from Stand Sit to Stand: Supervision  General transfer comment: BUE assisting to power to stand, supv for safety    Ambulation/Gait Ambulation/Gait assistance: Supervision Gait Distance (Feet): 75 Feet (x2) Assistive device: Rolling walker (2 wheels) Gait Pattern/deviations: Step-through pattern, Decreased stride length Gait velocity: decreased  General Gait Details: step through pattern with increased lateral weight-shifting, good steadiness, fatigues easily need seated rest break, on RA with SpO2 96% with ambulation  Stairs            Wheelchair Mobility    Modified Rankin (Stroke Patients Only)       Balance Overall balance assessment: Needs assistance Sitting-balance support: Feet supported Sitting balance-Leahy Scale: Good  Standing balance support: During functional activity, Bilateral upper extremity supported, Reliant on assistive device for balance Standing balance-Leahy Scale: Fair       Pertinent Vitals/Pain Pain Assessment Pain Assessment: No/denies pain    Home Living Family/patient expects to be discharged to:: Private residence Living Arrangements: Spouse/significant other Available Help at Discharge: Family Type of Home: House Home Access: Ramped entrance       Home Layout: One level Home Equipment: Conservation officer, nature (2 wheels);Rollator (4 wheels);Wheelchair - power;Electric scooter;BSC/3in1;Grab bars - tub/shower;Grab bars - toilet;Shower seat - built in Additional Comments: pt reports spouse at home using RW, gets HHPT and unable to assist her  physically    Prior Function Prior Level of Function : Independent/Modified Independent;Driving  Mobility Comments: pt reports using rollator in the home and community, denies falls in last year ADLs Comments: pt reports ind with self care and cooking light meals, has cleaning lady     Hand Dominance        Extremity/Trunk Assessment        Lower Extremity Assessment Lower Extremity Assessment: RLE deficits/detail;LLE deficits/detail RLE Deficits / Details: AROM WFL, strength grossly 3+/5 RLE Sensation: history of peripheral neuropathy RLE Coordination: WNL LLE Deficits / Details: AROM WFL, strength grossly 3+/5 LLE Sensation: history of peripheral neuropathy LLE Coordination: WNL    Cervical / Trunk Assessment Cervical / Trunk Assessment: Normal  Communication   Communication: No difficulties  Cognition Arousal/Alertness: Awake/alert Behavior During Therapy: WFL for tasks assessed/performed Overall Cognitive Status: Within Functional Limits for tasks assessed        General Comments      Exercises     Assessment/Plan    PT Assessment Patient needs continued PT services  PT Problem List Decreased strength;Decreased activity tolerance;Decreased balance;Decreased mobility;Cardiopulmonary status limiting activity       PT Treatment Interventions DME instruction;Gait training;Functional mobility training;Therapeutic activities;Therapeutic exercise;Balance training;Patient/family education    PT Goals (Current goals can be found in the Care Plan section)  Acute Rehab PT Goals Patient Stated Goal: return home PT Goal Formulation: With patient Time For Goal Achievement: 08/30/22 Potential to Achieve Goals: Good    Frequency Min 3X/week     Co-evaluation               AM-PAC PT "6 Clicks" Mobility  Outcome Measure Help needed turning from your back to your side while in a flat bed without using bedrails?: None Help needed moving from lying on your back  to sitting on the side of a flat bed without using bedrails?: A Little Help needed moving to and from a bed to a chair (including a wheelchair)?: A Little Help needed standing up from a chair using your arms (e.g., wheelchair or bedside chair)?: A Little Help needed to walk in hospital room?: A Little Help needed climbing 3-5 steps with a railing? : A Lot 6 Click Score: 18    End of Session   Activity Tolerance: Patient tolerated treatment well Patient left: in chair;with call bell/phone within reach Nurse Communication: Mobility status;Other (comment) (SpO2) PT Visit Diagnosis: Other abnormalities of gait and mobility (R26.89);Muscle weakness (generalized) (M62.81)    Time: 9678-9381 PT Time Calculation (min) (ACUTE ONLY): 32 min   Charges:   PT Evaluation $PT Eval Low Complexity: 1 Low PT Treatments $Gait Training: 8-22 mins        Tori Khelani Kops PT, DPT 08/16/22, 12:40 PM

## 2022-08-16 NOTE — TOC Progression Note (Signed)
Transition of Care [facility]) - Progression Note    Patient Details  Name: Debra Ryan MRN: [id number] Date of Birth: 1941/01/26  [facility] of [facility]) CM/SW Cole, LCSW Phone Number: 08/16/2022, 10:29 AM  Clinical Narrative:    CSW received message from [facility] that this pt is active with their agency for home health PT and aide. TOC will continue to follow for discharge planning needs.   Expected Discharge Plan: Home/Self Care Barriers to Discharge: Continued Medical Work up  Expected Discharge Plan and Services Expected Discharge Plan: Home/Self Care In-house Referral: NA Discharge Planning Services: CM Consult Post Acute Care Choice: NA Living arrangements for the past 2 months: Single Family Home                                       Social Determinants of Health (SDOH) Interventions Housing Interventions: Intervention Not Indicated  Readmission Risk Interventions    08/15/2022    1:52 PM  Readmission Risk Prevention Plan  Transportation Screening Complete  PCP or Specialist Appt within 5-7 Days Complete  Home Care Screening Complete  Medication Review (RN CM) Complete

## 2022-08-17 DIAGNOSIS — G8929 Other chronic pain: Secondary | ICD-10-CM

## 2022-08-17 DIAGNOSIS — J9601 Acute respiratory failure with hypoxia: Secondary | ICD-10-CM

## 2022-08-17 DIAGNOSIS — J180 Bronchopneumonia, unspecified organism: Secondary | ICD-10-CM | POA: Diagnosis not present

## 2022-08-17 DIAGNOSIS — I5033 Acute on chronic diastolic (congestive) heart failure: Secondary | ICD-10-CM

## 2022-08-17 DIAGNOSIS — E039 Hypothyroidism, unspecified: Secondary | ICD-10-CM

## 2022-08-17 DIAGNOSIS — D649 Anemia, unspecified: Secondary | ICD-10-CM

## 2022-08-17 DIAGNOSIS — E109 Type 1 diabetes mellitus without complications: Secondary | ICD-10-CM

## 2022-08-17 DIAGNOSIS — N1831 Chronic kidney disease, stage 3a: Secondary | ICD-10-CM

## 2022-08-17 DIAGNOSIS — I480 Paroxysmal atrial fibrillation: Secondary | ICD-10-CM

## 2022-08-17 DIAGNOSIS — I10 Essential (primary) hypertension: Secondary | ICD-10-CM

## 2022-08-17 DIAGNOSIS — E785 Hyperlipidemia, unspecified: Secondary | ICD-10-CM

## 2022-08-17 DIAGNOSIS — G4733 Obstructive sleep apnea (adult) (pediatric): Secondary | ICD-10-CM

## 2022-08-17 LAB — GLUCOSE, CAPILLARY
Glucose-Capillary: 206 mg/dL — ABNORMAL HIGH (ref 70–99)
Glucose-Capillary: 195 mg/dL — ABNORMAL HIGH (ref 70–99)
Glucose-Capillary: 185 mg/dL — ABNORMAL HIGH (ref 70–99)
Glucose-Capillary: 183 mg/dL — ABNORMAL HIGH (ref 70–99)
Glucose-Capillary: 219 mg/dL — ABNORMAL HIGH (ref 70–99)

## 2022-08-17 LAB — CBC
HCT: 29.9 % — ABNORMAL LOW (ref 36.0–46.0)
Hemoglobin: 9.5 g/dL — ABNORMAL LOW (ref 12.0–15.0)
MCH: 31.1 pg (ref 26.0–34.0)
MCHC: 31.8 g/dL (ref 30.0–36.0)
MCV: 98 fL (ref 80.0–100.0)
Platelets: 172 10*3/uL (ref 150–400)
RBC: 3.05 MIL/uL — ABNORMAL LOW (ref 3.87–5.11)
RDW: 14.3 % (ref 11.5–15.5)
WBC: 5.6 10*3/uL (ref 4.0–10.5)
nRBC: 0 % (ref 0.0–0.2)

## 2022-08-17 LAB — BASIC METABOLIC PANEL
Anion gap: 4 — ABNORMAL LOW (ref 5–15)
BUN: 17 mg/dL (ref 8–23)
CO2: 26 mmol/L (ref 22–32)
Calcium: 8.5 mg/dL — ABNORMAL LOW (ref 8.9–10.3)
Chloride: 107 mmol/L (ref 98–111)
Creatinine, Ser: 1.33 mg/dL — ABNORMAL HIGH (ref 0.44–1.00)
GFR, Estimated: 41 mL/min — ABNORMAL LOW (ref >60)
Glucose, Bld: 236 mg/dL — ABNORMAL HIGH (ref 70–99)
Potassium: 4 mmol/L (ref 3.5–5.1)
Sodium: 137 mmol/L (ref 135–145)

## 2022-08-17 LAB — MAGNESIUM: Magnesium: 1.8 mg/dL (ref 1.7–2.4)

## 2022-08-17 MED ORDER — FUROSEMIDE 10 MG/ML IJ SOLN
40.0000 mg | Freq: Once | INTRAMUSCULAR | Status: AC
  Administered 2022-08-17: 40 mg via INTRAVENOUS
  Filled 2022-08-17: qty 4

## 2022-08-17 NOTE — Care Management Important Message (Signed)
Important Message  Patient Details IM Letter placed in Patients room. Name: Debra Ryan MRN: [id number] Date of Birth: [birth year]   Medicare Important Message Given:  Yes     Kerin Salen 08/17/2022, 11:53 AM

## 2022-08-17 NOTE — Progress Notes (Addendum)
PROGRESS NOTE    Debra Ryan  YYT:[id number] DOB: [birth year] DOA: 08/14/2022 PCP: Thomes Dinning, MD    Brief Narrative:  Debra Ryan is a 81 y.o. 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  Of note, patient was recently admitted to [facility] 9/22-9/28 for pneumonia decompensated CHF, hypoglycemia, and UTI.  She was discharged to SNF and returned home 2 days ago.  After returning home, patient continued to have shortness of breath, wheezing, nonproductive cough.  In the ED patient was afebrile but was hypoxic with pulse ox of 89% on room air and was placed on 2 L of nasal cannula oxygen.WBC was 4.3.  BUN 9, creatinine 1.03.  BNP 470.5.  High sensitive troponin 7.  Lactic acid 1.2.  COVID-19 PCR negative.  Chest x-ray with bronchial wall thickening with mild patchy opacities in the lung bases suggestive of bronchitis and possible bronchopneumonia, bilateral interstitial thickening consistent with mild component of congestive heart failure.  Patient was given bronchodilator treatments,vancomycin and cefepime and was considered for admission to hospital for further evaluation and treatment.   Assessment & Plan:  Principal Problem:   Bronchopneumonia Active Problems:   Acute respiratory failure with hypoxia (HCC)   Acute on chronic diastolic CHF (congestive heart failure) (HCC)   Normocytic anemia   Type 1 diabetes (HCC)   Chronic pain   OSA (obstructive sleep apnea)   CKD (chronic kidney disease)  stage 3, GFR 30-59 ml/min (HCC)   Paroxysmal atrial fibrillation (HCC)   CAD (coronary artery disease)   HTN (hypertension)   HLD (hyperlipidemia)   Hypothyroidism   Bronchopneumonia Chest x-ray showed signs suggestive of bronchitis/ bronchopneumonia.  No evidence of sepsis.  Does not have fever or leukocytosis.  N SARS-CoV-2 PCR negative.  Procalcitonin less than 0.10.  MRSA PCR negative, she was initially on IV vancomycin and cefepime.  Vancomycin has been discontinued.  Blood cultures negative in 3 days.  Continue albuterol nebs, spirometry, flutter valve and Mucinex.  Patient is still very symptomatic with dyspnea and was very hypoxic this morning.  Pulse ox of 88% standing.  We will continue to monitor the patient might need oxygen on discharge.  Continue to wean oxygen as able.  Acute on chronic diastolic CHF Patient was volume overloaded on exam with chest x-ray showing CHF.  Last 2D Echo done at [facility] on 07/23/2022 showing EF 50 to 55%.  On Lasix 20 mg daily.  Continue strict intake and output charting Daily weights. -Strict I's and O's and daily weights.  Patient is negative balance for 467 mL.  We will give 1 dose of IV Lasix today.  Mild acute kidney injury.  Has improved at this time.  Continue to monitor BMP.  Creatinine of 1.3 today from 1.7.   Acute hypoxemic respiratory failure Secondary to CHF and bronchopneumonia.  Patient initially had oxygen saturation 89% on room air, currently on room air.   We will try 1 dose of IV Lasix today.  Continue to wean oxygen as able.  Patient is  not on oxygen at baseline. Uses mostly at night time with CPAP machine  at 2 L/min.   Normocytic anemia Hemoglobin of 9.5 at this time.  No evidence of bleeding.  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  Follows up with oncology as outpatient.  Patient appears to be dyspneic on supplemental oxygen.  Patient is not on oxygen at baseline   Type 1 diabetes with  hyperglycemia She dpes have a history of insulin pump and hypoglycemia.  Currently dose has been adjusted to Lantus 10 units in the morning and 5 units in the evening.  Will resume insulin pump on discharge.  Latest Hemoglobin A1c 8.8.   Chronic pain Continue home OxyContin   OSA --Continue nightly CPAP   CKD stage IIIa Latest creatinine at 1.3.  Will monitor with diuretics.  Paroxysmal A-fib:  Continue Coreg and Xarelto   CAD with stents:  Continue aspirin and statins.   Hypertension:  Continue Coreg Lasix.  Lisinopril currently on hold.   Hyperlipidemia Continue Lipitor   Hypothyroidism Continue Synthroid   GERD: Continue PPI   Morbid obesity Body mass index is 45.48 kg/m.  Would benefit from weight loss as outpatient.  Debility, deconditioning.  Patient was previously at the skilled nursing facility.  Currently has been seen by PT and recommend home health PT on discharge but appears is weak and deconditioned and debilitated.      DVT prophylaxis:  Rivaroxaban (XARELTO) tablet 15 mg   Code Status:     Code Status: DNR  Disposition: Home with home health as per PT.  We will see the progress.  Status is: Inpatient  Remains inpatient appropriate because: Hypoxia even on minimal exertion, supplemental oxygen, fatigue weakness debility,  IV antibiotic   Family Communication: Spoke with the patient's spouse and updated her about the clinical condition of the patient.  Consultants:  None  Procedures:  None  Antimicrobials:  Cefepime IV  Anti-infectives (From admission, onward)    Start     Dose/Rate Route Frequency Ordered Stop   08/15/22 2000  vancomycin (VANCOREADY) IVPB 750 mg/150 mL  Status:  Discontinued        750 mg 150 mL/hr over 60 Minutes Intravenous Every 24 hours 08/14/22 1803 08/15/22 1713   08/15/22 0200  ceFEPIme (MAXIPIME) 2 g in sodium chloride 0.9 % 100 mL IVPB  Status:  Discontinued        2 g 200 mL/hr over 30 Minutes Intravenous  Every 8 hours 08/15/22 0144 08/15/22 0146   08/15/22 0200  ceFEPIme (MAXIPIME) 2 g in sodium chloride 0.9 % 100 mL IVPB        2 g 200 mL/hr over 30 Minutes Intravenous Every 12 hours 08/15/22 0146     08/14/22 1845  vancomycin (VANCOCIN) 500 mg in sodium chloride 0.9 % 100 mL IVPB       See Hyperspace for full Linked Orders Report.   500 mg 100 mL/hr over 60 Minutes Intravenous  Once 08/14/22 1730 08/14/22 2220   08/14/22 1730  vancomycin (VANCOCIN) IVPB 1000 mg/200 mL premix       See Hyperspace for full Linked Orders Report.   1,000 mg 200 mL/hr over 60 Minutes Intravenous  Once 08/14/22 1730 08/14/22 2027   08/14/22 1715  ceFEPIme (MAXIPIME) 1 g in sodium chloride 0.9 % 100 mL IVPB        1 g 200 mL/hr over 30 Minutes Intravenous  Once 08/14/22 1702 08/14/22 1936        Subjective: Today, patient was seen and examined at bedside.  Complains of fatigue weakness shortness of breath and dyspnea.  Was very hypoxic on standing this morning at 88%.  Denies any pain, nausea or vomiting today.  Objective: Vitals:   08/16/22 2010 08/16/22 2051 08/17/22 0303 08/17/22 0500  BP: 123/65  123/68   Pulse: 85  100   Resp: '16 18 16   '$ Temp: 98.4 F (36.9 C)  (!) 97.4 F (36.3 C)   TempSrc: Oral  Oral   SpO2: 90%  96%   Weight:    108.5 kg  Height:        Intake/Output Summary (Last 24 hours) at 08/17/2022 1049 Last data filed at 08/17/2022 0400 Gross per 24 hour  Intake 1442 ml  Output 1050 ml  Net 392 ml   Filed Weights   08/14/22 1613 08/16/22 0500 08/17/22 0500  Weight: 111.1 kg 112.8 kg 108.5 kg    Physical Examination: Body mass index is 43.75 kg/m.  General: Obese built, not in obvious distress elderly female, appears deconditioned and weak, on nasal cannula oxygen HENT: Mild pallor noted. Oral mucosa is moist.  Chest:    Diminished breath sounds bilaterally.  Coarse breath sounds noted. CVS: S1 &S2 heard. No murmur.  Regular rate and rhythm. Abdomen: Soft, nontender,  nondistended.  Bowel sounds are heard.   Extremities: No cyanosis, clubbing.  Trace pedal edema noted.  Peripheral pulses are palpable. Psych: Alert, awake and oriented, Communicative CNS:  No cranial nerve deficits.  Power equal in all extremities.  Generalized weakness noted Skin: Warm and dry.  No rashes noted.  Data Reviewed:   CBC: Recent Labs  Lab 08/14/22 1643 08/15/22 0452 08/17/22 0529  WBC 4.3 5.4 5.6  NEUTROABS 3.1  --   --   HGB 10.7* 10.2* 9.5*  HCT 33.1* 32.6* 29.9*  MCV 95.4 97.6 98.0  PLT 227 187 329    Basic Metabolic Panel: Recent Labs  Lab 08/14/22 1643 08/15/22 0452 08/16/22 0533 08/17/22 0529  NA 140 137 138 137  K 4.1 4.8 4.0 4.0  CL 108 107 106 107  CO2 26 21* 26 26  GLUCOSE 185* 430* 177* 236*  BUN '9 13 19 17  '$ CREATININE 1.03* 1.08* 1.70* 1.33*  CALCIUM 8.3* 8.6* 8.6* 8.5*  MG  --   --   --  1.8    Liver Function Tests: Recent Labs  Lab 08/15/22 0452  AST 24  ALT 16  ALKPHOS 106  BILITOT 1.1  PROT 5.9*  ALBUMIN 3.2*     Radiology Studies: No results found.    LOS: 3 days    Debra Lipps, MD [facility] Available via Epic secure chat 7am-7pm After these hours, please refer to coverage provider listed on [email or web address] 08/17/2022, 10:49 AM

## 2022-08-17 NOTE — Hospital Course (Signed)
Debra Ryan is a 81 y.o. 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  Of note, patient was recently admitted to [facility] 9/22-9/28 for pneumonia decompensated CHF, hypoglycemia, and UTI.  She was discharged to SNF and returned home 2 days ago.  After returning home, patient continued to have shortness of breath, wheezing, nonproductive cough.  In the ED patient was afebrile but was hypoxic with pulse ox of 89% on room air and was placed on 2 L of nasal cannula oxygen.WBC was 4.3.  BUN 9, creatinine 1.03.  BNP 470.5.  High sensitive troponin 7.  Lactic acid 1.2.  COVID-19 PCR negative.  Chest x-ray with bronchial wall thickening with mild patchy opacities in the lung bases suggestive of bronchitis and possible bronchopneumonia, bilateral interstitial thickening consistent with mild component of congestive heart failure.  Patient was given bronchodilator treatments,vancomycin and cefepime and was considered for admission to hospital for further evaluation and treatment.   Assessment & Plan:   Bronchopneumonia Chest x-ray showed signs suggestive of bronchitis/ bronchopneumonia.  No evidence of sepsis.  Does not have fever or leukocytosis.  N SARS-CoV-2 PCR negative.  Procalcitonin less than 0.10.  MRSA PCR negative, she was initially on IV vancomycin and cefepime.  Vancomycin has been discontinued.  Blood cultures negative in 3 days.  Continue albuterol nebs, spirometry, flutter valve and Mucinex.  Acute on chronic diastolic  CHF Patient was volume overloaded on exam with chest x-ray showing CHF.  Last 2D Echo done at [facility] on 07/23/2022 showing EF 50 to 55%.  On Lasix 20 mg daily.  Continue strict intake and output charting Daily weights. --Furosemide 20 mg p.o. daily --Strict I's and O's and daily weights   Acute hypoxemic respiratory failure Secondary to CHF and bronchopneumonia.  Patient initially had oxygen saturation 89% on room air, currently on room air.  Flow ambulatory pulse ox.   Normocytic anemia Globin of 9.5 at this time.  No evidence of bleeding.  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  Follows up with oncology as outpatient.   Type 1 diabetes with hyperglycemia She did have a history of insulin pump and hypoglycemia.  Currently dose has been adjusted to Lantus 10 units in the morning and 5 units in the evening.  Latest Hemoglobin A1c 8.8.   Chronic pain Continue home OxyContin   OSA --Continue nightly CPAP   CKD stage IIIa Latest creatinine at 1.3.  Paroxysmal A-fib:  Continue Coreg and Xarelto   CAD with stents:  Continue aspirin and statins.   Hypertension:  Continue Coreg Lasix.  Lisinopril currently on hold.    Hyperlipidemia Continue Lipitor   Hypothyroidism Continue Synthroid   GERD: Continue PPI   Morbid obesity Body mass index is 45.48 kg/m.  Would benefit from weight loss as outpatient.  Debility, deconditioning.  Patient was previously at the skilled nursing facility.  Currently has been seen by PT and recommend home health PT on discharge.

## 2022-08-17 NOTE — Plan of Care (Signed)

## 2022-08-18 ENCOUNTER — Inpatient Hospital Stay (HOSPITAL_COMMUNITY): Payer: Medicare Other

## 2022-08-18 DIAGNOSIS — I5033 Acute on chronic diastolic (congestive) heart failure: Secondary | ICD-10-CM | POA: Diagnosis not present

## 2022-08-18 DIAGNOSIS — J9601 Acute respiratory failure with hypoxia: Secondary | ICD-10-CM | POA: Diagnosis not present

## 2022-08-18 DIAGNOSIS — G8929 Other chronic pain: Secondary | ICD-10-CM | POA: Diagnosis not present

## 2022-08-18 DIAGNOSIS — J180 Bronchopneumonia, unspecified organism: Secondary | ICD-10-CM | POA: Diagnosis not present

## 2022-08-18 LAB — BASIC METABOLIC PANEL
Anion gap: 7 (ref 5–15)
BUN: 15 mg/dL (ref 8–23)
CO2: 27 mmol/L (ref 22–32)
Calcium: 8.6 mg/dL — ABNORMAL LOW (ref 8.9–10.3)
Chloride: 105 mmol/L (ref 98–111)
Creatinine, Ser: 1.07 mg/dL — ABNORMAL HIGH (ref 0.44–1.00)
GFR, Estimated: 53 mL/min — ABNORMAL LOW (ref >60)
Glucose, Bld: 207 mg/dL — ABNORMAL HIGH (ref 70–99)
Potassium: 3.5 mmol/L (ref 3.5–5.1)
Sodium: 139 mmol/L (ref 135–145)

## 2022-08-18 LAB — GLUCOSE, CAPILLARY
Glucose-Capillary: 195 mg/dL — ABNORMAL HIGH (ref 70–99)
Glucose-Capillary: 181 mg/dL — ABNORMAL HIGH (ref 70–99)
Glucose-Capillary: 211 mg/dL — ABNORMAL HIGH (ref 70–99)
Glucose-Capillary: 214 mg/dL — ABNORMAL HIGH (ref 70–99)
Glucose-Capillary: 284 mg/dL — ABNORMAL HIGH (ref 70–99)

## 2022-08-18 LAB — CBC
HCT: 32.9 % — ABNORMAL LOW (ref 36.0–46.0)
Hemoglobin: 10.4 g/dL — ABNORMAL LOW (ref 12.0–15.0)
MCH: 30.5 pg (ref 26.0–34.0)
MCHC: 31.6 g/dL (ref 30.0–36.0)
MCV: 96.5 fL (ref 80.0–100.0)
Platelets: 177 10*3/uL (ref 150–400)
RBC: 3.41 MIL/uL — ABNORMAL LOW (ref 3.87–5.11)
RDW: 14.1 % (ref 11.5–15.5)
WBC: 5.4 10*3/uL (ref 4.0–10.5)
nRBC: 0 % (ref 0.0–0.2)

## 2022-08-18 LAB — BLOOD GAS, ARTERIAL
Acid-Base Excess: 2.8 mmol/L — ABNORMAL HIGH (ref 0.0–2.0)
Bicarbonate: 30.7 mmol/L — ABNORMAL HIGH (ref 20.0–28.0)
Drawn by: 270211
FIO2: 80 %
O2 Content: 15 L/min
O2 Saturation: 91.7 %
Patient temperature: 36.4
pCO2 arterial: 59 mmHg — ABNORMAL HIGH (ref 32–48)
pH, Arterial: 7.32 — ABNORMAL LOW (ref 7.35–7.45)
pO2, Arterial: 65 mmHg — ABNORMAL LOW (ref 83–108)

## 2022-08-18 LAB — MAGNESIUM: Magnesium: 1.8 mg/dL (ref 1.7–2.4)

## 2022-08-18 MED ORDER — DILTIAZEM LOAD VIA INFUSION: 10.0000 mg | Freq: Once | INTRAVENOUS | Status: DC

## 2022-08-18 MED ORDER — SODIUM CHLORIDE 0.9 % IV SOLN: INTRAVENOUS | Status: DC | PRN

## 2022-08-18 MED ORDER — DILTIAZEM HCL 25 MG/5ML IV SOLN
10.0000 mg | Freq: Once | INTRAVENOUS | Status: AC
  Administered 2022-08-18: 10 mg via INTRAVENOUS
  Filled 2022-08-18: qty 5

## 2022-08-18 MED ORDER — ORAL CARE MOUTH RINSE: 15.0000 mL | OROMUCOSAL | Status: DC | PRN

## 2022-08-18 MED ORDER — CHLORHEXIDINE GLUCONATE CLOTH 2 % EX PADS
6.0000 | MEDICATED_PAD | Freq: Every day | CUTANEOUS | Status: DC
  Administered 2022-08-18 – 2022-08-24 (×7): 6 via TOPICAL

## 2022-08-18 MED ORDER — DILTIAZEM HCL-DEXTROSE 125-5 MG/125ML-% IV SOLN (PREMIX)
5.0000 mg/h | INTRAVENOUS | Status: DC
  Administered 2022-08-18: 5 mg/h via INTRAVENOUS
  Administered 2022-08-19 – 2022-08-20 (×2): 7.5 mg/h via INTRAVENOUS
  Filled 2022-08-18 (×3): qty 125

## 2022-08-18 MED ORDER — METHYLPREDNISOLONE SODIUM SUCC 40 MG IJ SOLR
40.0000 mg | Freq: Two times a day (BID) | INTRAMUSCULAR | Status: DC
  Administered 2022-08-18 – 2022-08-21 (×7): 40 mg via INTRAVENOUS
  Filled 2022-08-18 (×8): qty 1

## 2022-08-18 MED ORDER — FUROSEMIDE 10 MG/ML IJ SOLN
20.0000 mg | Freq: Once | INTRAMUSCULAR | Status: AC
  Administered 2022-08-18: 20 mg via INTRAVENOUS
  Filled 2022-08-18: qty 2

## 2022-08-18 MED ORDER — ORAL CARE MOUTH RINSE
15.0000 mL | OROMUCOSAL | Status: DC
  Administered 2022-08-18 – 2022-08-19 (×2): 15 mL via OROMUCOSAL

## 2022-08-18 NOTE — TOC Progression Note (Signed)
Transition of Care [facility]) - Progression Note    Patient Details  Name: Debra Ryan MRN: [id number] Date of Birth: 1941-03-02  Transition of Care [facility]) CM/SW [place], LCSW Phone Number: 08/18/2022, 11:59 AM  Clinical Narrative:    Met with pt and confirmed plan to return home with HHPT/Aide through [facility]. Pt denies having any further home health needs.    Expected Discharge Plan: Home/Self Care Barriers to Discharge: Continued Medical Work up  Expected Discharge Plan and Services Expected Discharge Plan: Home/Self Care In-house Referral: NA Discharge Planning Services: CM Consult Post Acute Care Choice: NA Living arrangements for the past 2 months: Single Family Home                                       Social Determinants of Health (SDOH) Interventions Housing Interventions: Intervention Not Indicated  Readmission Risk Interventions    08/15/2022    1:52 PM  Readmission Risk Prevention Plan  Transportation Screening Complete  PCP or Specialist Appt within 5-7 Days Complete  Home Care Screening Complete  Medication Review (RN CM) Complete

## 2022-08-18 NOTE — Progress Notes (Signed)
PROGRESS NOTE    Debra Ryan  VFI:[id number] DOB: Jun 13, 1941 DOA: 08/14/2022 PCP: Thomes Dinning, MD    Brief Narrative:  Debra Ryan is a 81 y.o. 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  Of note, patient was recently admitted to [facility] 9/22-9/28 for pneumonia decompensated CHF, hypoglycemia, and UTI.  She was discharged to SNF and returned home 2 days ago.  After returning home, patient continued to have shortness of breath, wheezing, nonproductive cough.  In the ED, patient was afebrile but was hypoxic with pulse ox of 89% on room air and was placed on 2 L of nasal cannula oxygen.WBC was 4.3.  BUN 9, creatinine 1.03.  BNP 470.5.  High sensitive troponin 7.  Lactic acid 1.2.  COVID-19 PCR negative.  Chest x-ray with bronchial wall thickening with mild patchy opacities in the lung bases suggestive of bronchitis and possible bronchopneumonia, bilateral interstitial thickening consistent with mild component of congestive heart failure.  Patient was given bronchodilator treatments,vancomycin and cefepime and was considered for admission to hospital for further evaluation and treatment.   Assessment & Plan:  Principal Problem:   Bronchopneumonia Active Problems:   Acute respiratory failure with hypoxia (HCC)   Acute on chronic diastolic CHF (congestive heart failure) (HCC)   Normocytic anemia   Type 1 diabetes (HCC)   Chronic pain   OSA (obstructive sleep apnea)   CKD (chronic kidney disease)  stage 3, GFR 30-59 ml/min (HCC)   Paroxysmal atrial fibrillation (HCC)   CAD (coronary artery disease)   HTN (hypertension)   HLD (hyperlipidemia)   Hypothyroidism   Bronchopneumonia Chest x-ray showed signs suggestive of bronchitis/ bronchopneumonia.  No evidence of sepsis.  Does not have fever or leukocytosis.   SARS-CoV-2 PCR negative.  Procalcitonin less than 0.10.  MRSA PCR negative, she was initially on IV vancomycin and cefepime.  Vancomycin has been discontinued.  Blood cultures negative in 4 days.  Today patient had increased work of breathing with rattling congestion and respiratory distress.  Rapid response was called in.  Noted to be hypotensive with tachycardia as well.  Feels anxious.  We will get stat ABG, chest x-ray, give 1 dose of IV 40 Lasix.  Continue nebulizers.  We will give Cardizem IV x1.  We will transfer the patient to stepdown unit for BiPAP.  Communicated with the patient's spouse regarding the change in patient's condition.  Acute on chronic diastolic CHF Patient was volume overloaded on exam with chest x-ray showing CHF.  Last 2D Echo done at [facility] on 07/23/2022 showing EF 50 to 55%.  On Lasix 20 mg daily.  Will give additional 40 mg of IV Lasix today.  Received 40 mg IV Lasix yesterday with significant urinary output.  Continue strict intake and output charting Daily weights.   Patient is negative balance for 2177 mL.    Mild acute kidney injury on CKD stage stage IIIa.  Has improved at this time.  Continue to monitor BMP.  Creatinine of 1.0 today from initial 1.7.   Acute hypoxemic respiratory failure Secondary to CHF and bronchopneumonia.  Currently requiring high flow  oxygen.  Might need BiPAP.  Repeat chest x-ray.  Add Solu-Medrol as well.  Patient is  not on oxygen at baseline. Uses mostly at night time with CPAP machine at 2 L/min.   Normocytic anemia Hemoglobin of 10.4 at this time.  No evidence of bleeding.  Ampullary adenocarcinoma with probable bilateral  lung mets Not on active treatment.  Follows up with oncology as outpatient.  Patient appears to be dyspneic on supplemental oxygen.  DNR.  Okay with BiPAP.  We will transfer to stepdown unit at this time.   Type 1 diabetes with hyperglycemia She does have a history of insulin pump and hypoglycemia.  Currently dose has been adjusted to Lantus 10 units in the morning and 5 units in the evening.  Will resume insulin pump on discharge.  Latest Hemoglobin A1c 8.8.   Chronic pain Continue home OxyContin   OSA Continue nightly CPAP   Paroxysmal A-fib: With rapid ventricular response today.. On Coreg and Xarelto.  Patient is anxious and in respiratory distress.  Might need Cardizem drip.  We will try 1 dose of IV Cardizem p.o.   CAD with stents:  Continue aspirin and statins.   Hypertension:  Continue Coreg Lasix.  Lisinopril currently on hold.   Hyperlipidemia Continue Lipitor   Hypothyroidism Continue Synthroid   GERD: Continue PPI   Morbid obesity Body mass index is 45.48 kg/m.  Would benefit from weight loss as outpatient.  Debility, deconditioning.  Patient was previously at the skilled nursing facility.  Currently has been seen by PT and recommend home health PT on discharge but appears is weak and deconditioned and debilitated.     DVT prophylaxis:  Rivaroxaban (XARELTO) tablet 15 mg   Code Status:     Code Status: DNR  Disposition: Uncertain at this time, initial plan Home with home health.  Status is: Inpatient  Remains inpatient appropriate because: Progressive respiratory distress, hypoxic respiratory failure, need for transfer to stepdown unit, possible BiPAP.     Family Communication:  I spoke with the patient's spouse and updated him about the clinical condition of the patient and the plan for closer monitoring at the stepdown unit.  Patient's spouse confirmed DNR and okay with BiPAP if necessary.  Consultants:  None  Procedures:  None  Antimicrobials:   Cefepime IV  Anti-infectives (From admission, onward)    Start     Dose/Rate Route Frequency Ordered Stop   08/15/22 2000  vancomycin (VANCOREADY) IVPB 750 mg/150 mL  Status:  Discontinued        750 mg 150 mL/hr over 60 Minutes Intravenous Every 24 hours 08/14/22 1803 08/15/22 1713   08/15/22 0200  ceFEPIme (MAXIPIME) 2 g in sodium chloride 0.9 % 100 mL IVPB  Status:  Discontinued        2 g 200 mL/hr over 30 Minutes Intravenous Every 8 hours 08/15/22 0144 08/15/22 0146   08/15/22 0200  ceFEPIme (MAXIPIME) 2 g in sodium chloride 0.9 % 100 mL IVPB        2 g 200 mL/hr over 30 Minutes Intravenous Every 12 hours 08/15/22 0146     08/14/22 1845  vancomycin (VANCOCIN) 500 mg in sodium chloride 0.9 % 100 mL IVPB       See Hyperspace for full Linked Orders Report.   500 mg 100 mL/hr over 60 Minutes Intravenous  Once 08/14/22 1730 08/14/22 2220   08/14/22 1730  vancomycin (VANCOCIN) IVPB 1000 mg/200 mL premix       See Hyperspace for  full Linked Orders Report.   1,000 mg 200 mL/hr over 60 Minutes Intravenous  Once 08/14/22 1730 08/14/22 2027   08/14/22 1715  ceFEPIme (MAXIPIME) 1 g in sodium chloride 0.9 % 100 mL IVPB        1 g 200 mL/hr over 30 Minutes Intravenous  Once 08/14/22 1702 08/14/22 1936      Subjective: Today, patient was seen and examined at bedside.  Patient had respiratory distress with increased rattling and increased work of breathing.  Rapid response at the bedside.  Patient denied any chest pain but was unable to spit up.  Feels anxious.  Objective: Vitals:   08/18/22 0500 08/18/22 0537 08/18/22 1208 08/18/22 1300  BP:  (!) 150/96 (!) 139/95 (!) 163/125  Pulse:  98 99 (!) 122  Resp:  16 20   Temp:  97.6 F (36.4 C) 98.3 F (36.8 C) (!) 97.4 F (36.3 C)  TempSrc:  Oral Oral Oral  SpO2:  (!) 89% 97%   Weight: 108.6 kg     Height:        Intake/Output Summary (Last 24 hours) at 08/18/2022 1331 Last data filed at 08/18/2022 1005 Gross per 24 hour  Intake  150 ml  Output 960 ml  Net -810 ml    Filed Weights   08/16/22 0500 08/17/22 0500 08/18/22 0500  Weight: 112.8 kg 108.5 kg 108.6 kg    Physical Examination: Body mass index is 43.79 kg/m.   General: Obese built, mild to moderate respiratory distress, weak and deconditioned, HENT: Pallor noted.. Oral mucosa is moist.  Chest:  .  Diminished breath sounds bilaterally.  Coarse breath sounds noted.  Upper airway sounds heard. CVS: S1 &S2 heard. No murmur.  Regular rate and rhythm. Abdomen: Soft, nontender, nondistended.  Bowel sounds are heard.   Extremities: No cyanosis, clubbing trace peripheral edema..  Peripheral pulses are palpable. Psych: Alert, awake and oriented, anxious, CNS:  No cranial nerve deficits.  Generalized weakness noted. Skin: Warm and dry.  No rashes noted.   Data Reviewed:   CBC: Recent Labs  Lab 08/14/22 1643 08/15/22 0452 08/17/22 0529 08/18/22 0528  WBC 4.3 5.4 5.6 5.4  NEUTROABS 3.1  --   --   --   HGB 10.7* 10.2* 9.5* 10.4*  HCT 33.1* 32.6* 29.9* 32.9*  MCV 95.4 97.6 98.0 96.5  PLT 227 187 172 177     Basic Metabolic Panel: Recent Labs  Lab 08/14/22 1643 08/15/22 0452 08/16/22 0533 08/17/22 0529 08/18/22 0528  NA 140 137 138 137 139  K 4.1 4.8 4.0 4.0 3.5  CL 108 107 106 107 105  CO2 26 21* '26 26 27  '$ GLUCOSE 185* 430* 177* 236* 207*  BUN '9 13 19 17 15  '$ CREATININE 1.03* 1.08* 1.70* 1.33* 1.07*  CALCIUM 8.3* 8.6* 8.6* 8.5* 8.6*  MG  --   --   --  1.8 1.8     Liver Function Tests: Recent Labs  Lab 08/15/22 0452  AST 24  ALT 16  ALKPHOS 106  BILITOT 1.1  PROT 5.9*  ALBUMIN 3.2*      Radiology Studies: No results found.    LOS: 4 days    Flora Lipps, MD [facility] Available via Epic secure chat 7am-7pm After these hours, please refer to coverage provider listed on [email or web address] 08/18/2022, 1:31 PM

## 2022-08-18 NOTE — Progress Notes (Signed)
RT NOTE:  BiPAP order placed by MD, current pt floor cannot accommodate BiPAP machine, waiting on ICU/Stepdown room, charge RN aware.

## 2022-08-18 NOTE — Progress Notes (Signed)
Pharmacy Antibiotic Note  Debra Ryan is a 81 y.o. female admitted on 08/14/2022 presenting with SOB, concern for pna.  Pharmacy was consulted for Cefepime dosing.    Plan: Continue Cefepime 2gm IV q12h Continue to monitor clinical progress, renal function F/U C&S, abx deescalation / LOT     Height: '5\' 2"'$  (157.5 cm) Weight: 108.6 kg (239 lb 6.7 oz) IBW/kg (Calculated) : 50.1  Temp (24hrs), Avg:97.7 F (36.5 C), Min:97.6 F (36.4 C), Max:97.8 F (36.6 C)  Recent Labs  Lab 08/14/22 1643 08/14/22 1725 08/15/22 0452 08/16/22 0533 08/17/22 0529 08/18/22 0528  WBC 4.3  --  5.4  --  5.6 5.4  CREATININE 1.03*  --  1.08* 1.70* 1.33* 1.07*  LATICACIDVEN  --  1.2  --   --   --   --      Estimated Creatinine Clearance: 49.5 mL/min (A) (by C-G formula based on SCr of 1.07 mg/dL (H)).    Allergies  Allergen Reactions   Sulfur Rash and Hives   Clindamycin/Lincomycin Nausea And Vomiting   Sulfa Antibiotics Hives   Thank you for allowing pharmacy to be a part of this patient's care.  Royetta Asal, PharmD, BCPS Clinical Pharmacist [employer] Please utilize Amion for appropriate phone number to reach the unit pharmacist ([facility]) 08/18/2022 7:52 AM

## 2022-08-18 NOTE — Significant Event (Signed)
Rapid Response Event Note   Reason for Call :  Increased work of breathing, increased O2 needs, Afib RVR  Initial Focused Assessment:  Pt HR 140's, O2 sat 85 on 10L HFNC, dyspnea at rest, MD at bedside  Interventions:  EKG, Lasix '20mg'$ , Cardizem '10mg'$ , O2 increased to 15L HFNC  Plan of Care:  Transfer to SD/ICU for BiPap when bed available, O2 sat >90, HR 100-110's    MD Notified: [place] Call Time: [place] Time: 1300 End Time: [place], RN

## 2022-08-19 ENCOUNTER — Inpatient Hospital Stay (HOSPITAL_COMMUNITY): Payer: Medicare Other

## 2022-08-19 DIAGNOSIS — J180 Bronchopneumonia, unspecified organism: Secondary | ICD-10-CM | POA: Diagnosis not present

## 2022-08-19 DIAGNOSIS — G8929 Other chronic pain: Secondary | ICD-10-CM | POA: Diagnosis not present

## 2022-08-19 DIAGNOSIS — I5033 Acute on chronic diastolic (congestive) heart failure: Secondary | ICD-10-CM | POA: Diagnosis not present

## 2022-08-19 DIAGNOSIS — J9601 Acute respiratory failure with hypoxia: Secondary | ICD-10-CM | POA: Diagnosis not present

## 2022-08-19 LAB — BASIC METABOLIC PANEL
Anion gap: 9 (ref 5–15)
BUN: 15 mg/dL (ref 8–23)
CO2: 26 mmol/L (ref 22–32)
Calcium: 8.6 mg/dL — ABNORMAL LOW (ref 8.9–10.3)
Chloride: 98 mmol/L (ref 98–111)
Creatinine, Ser: 1.14 mg/dL — ABNORMAL HIGH (ref 0.44–1.00)
GFR, Estimated: 49 mL/min — ABNORMAL LOW (ref >60)
Glucose, Bld: 343 mg/dL — ABNORMAL HIGH (ref 70–99)
Potassium: 4 mmol/L (ref 3.5–5.1)
Sodium: 133 mmol/L — ABNORMAL LOW (ref 135–145)

## 2022-08-19 LAB — CULTURE, BLOOD (ROUTINE X 2)
Culture: NO GROWTH
Culture: NO GROWTH
Special Requests: ADEQUATE
Special Requests: ADEQUATE

## 2022-08-19 LAB — GLUCOSE, CAPILLARY
Glucose-Capillary: 281 mg/dL — ABNORMAL HIGH (ref 70–99)
Glucose-Capillary: 308 mg/dL — ABNORMAL HIGH (ref 70–99)
Glucose-Capillary: 424 mg/dL — ABNORMAL HIGH (ref 70–99)
Glucose-Capillary: 420 mg/dL — ABNORMAL HIGH (ref 70–99)
Glucose-Capillary: 257 mg/dL — ABNORMAL HIGH (ref 70–99)

## 2022-08-19 LAB — ECHOCARDIOGRAM COMPLETE
Height: 62 in
S' Lateral: 3.9 cm
Weight: 3629.65 oz

## 2022-08-19 LAB — CBC
HCT: 37.9 % (ref 36.0–46.0)
Hemoglobin: 12 g/dL (ref 12.0–15.0)
MCH: 30.8 pg (ref 26.0–34.0)
MCHC: 31.7 g/dL (ref 30.0–36.0)
MCV: 97.4 fL (ref 80.0–100.0)
Platelets: 189 10*3/uL (ref 150–400)
RBC: 3.89 MIL/uL (ref 3.87–5.11)
RDW: 14.1 % (ref 11.5–15.5)
WBC: 6.3 10*3/uL (ref 4.0–10.5)
nRBC: 0 % (ref 0.0–0.2)

## 2022-08-19 LAB — MAGNESIUM: Magnesium: 1.6 mg/dL — ABNORMAL LOW (ref 1.7–2.4)

## 2022-08-19 MED ORDER — INSULIN ASPART 100 UNIT/ML IJ SOLN
18.0000 [IU] | Freq: Once | INTRAMUSCULAR | Status: AC
  Administered 2022-08-19: 18 [IU] via SUBCUTANEOUS

## 2022-08-19 MED ORDER — CARVEDILOL 12.5 MG PO TABS
25.0000 mg | ORAL_TABLET | Freq: Two times a day (BID) | ORAL | Status: DC
  Administered 2022-08-19 – 2022-08-25 (×12): 25 mg via ORAL
  Filled 2022-08-19 (×13): qty 2

## 2022-08-19 MED ORDER — MAGNESIUM OXIDE -MG SUPPLEMENT 400 (240 MG) MG PO TABS
400.0000 mg | ORAL_TABLET | Freq: Two times a day (BID) | ORAL | Status: DC
  Administered 2022-08-19 – 2022-08-25 (×13): 400 mg via ORAL
  Filled 2022-08-19 (×13): qty 1

## 2022-08-19 MED ORDER — INSULIN ASPART 100 UNIT/ML IJ SOLN
5.0000 [IU] | Freq: Three times a day (TID) | INTRAMUSCULAR | Status: DC
  Administered 2022-08-19 – 2022-08-21 (×6): 5 [IU] via SUBCUTANEOUS

## 2022-08-19 MED ORDER — INSULIN ASPART 100 UNIT/ML IJ SOLN
3.0000 [IU] | Freq: Three times a day (TID) | INTRAMUSCULAR | Status: DC
  Administered 2022-08-19: 3 [IU] via SUBCUTANEOUS

## 2022-08-19 MED ORDER — FUROSEMIDE 10 MG/ML IJ SOLN
40.0000 mg | Freq: Once | INTRAMUSCULAR | Status: AC
  Administered 2022-08-19: 40 mg via INTRAVENOUS
  Filled 2022-08-19: qty 4

## 2022-08-19 MED ORDER — INSULIN GLARGINE-YFGN 100 UNIT/ML ~~LOC~~ SOLN
10.0000 [IU] | Freq: Every day | SUBCUTANEOUS | Status: DC
  Administered 2022-08-19 – 2022-08-24 (×6): 10 [IU] via SUBCUTANEOUS
  Filled 2022-08-19 (×6): qty 0.1

## 2022-08-19 MED ORDER — INSULIN GLARGINE-YFGN 100 UNIT/ML ~~LOC~~ SOLN
15.0000 [IU] | Freq: Every morning | SUBCUTANEOUS | Status: DC
  Administered 2022-08-19 – 2022-08-21 (×3): 15 [IU] via SUBCUTANEOUS
  Filled 2022-08-19 (×3): qty 0.15

## 2022-08-19 MED ORDER — MAGNESIUM SULFATE 2 GM/50ML IV SOLN
2.0000 g | Freq: Once | INTRAVENOUS | Status: AC
  Administered 2022-08-19: 2 g via INTRAVENOUS
  Filled 2022-08-19: qty 50

## 2022-08-19 MED ORDER — OXYCODONE HCL 5 MG PO TABS
5.0000 mg | ORAL_TABLET | ORAL | Status: DC | PRN
  Administered 2022-08-21 – 2022-08-24 (×4): 5 mg via ORAL
  Filled 2022-08-19 (×4): qty 1

## 2022-08-19 NOTE — Progress Notes (Signed)
PROGRESS NOTE    Debra Ryan  OIZ:[id number] DOB: 11-13-1940 DOA: 08/14/2022 PCP: Thomes Dinning, MD    Brief Narrative:  Debra Ryan is a 81 y.o. 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  Of note, patient was recently admitted to [facility] 9/22-9/28 for pneumonia decompensated CHF, hypoglycemia, and UTI.  She was discharged to SNF and returned home 2 days ago.  After returning home, patient continued to have shortness of breath, wheezing, nonproductive cough.    In the ED, patient was afebrile but was hypoxic with pulse ox of 89% on room air and was placed on 2 L of nasal cannula oxygen.WBC was 4.3.  BUN 9, creatinine 1.03.  BNP 470.5.  High sensitive troponin 7.  Lactic acid 1.2.  COVID-19 PCR negative.  Chest x-ray with bronchial wall thickening with mild patchy opacities in the lung bases suggestive of bronchitis and possible bronchopneumonia, bilateral interstitial thickening consistent with mild component of congestive heart failure.  Patient was given bronchodilator treatments,vancomycin and cefepime and was considered for admission to hospital for further evaluation and treatment.   Assessment & Plan:  Principal Problem:   Bronchopneumonia Active Problems:   Acute respiratory failure with hypoxia (HCC)   Acute on chronic diastolic CHF (congestive heart failure) (HCC)   Normocytic anemia   Type 1 diabetes (HCC)   Chronic pain   OSA (obstructive sleep apnea)   CKD (chronic kidney  disease) stage 3, GFR 30-59 ml/min (HCC)   Paroxysmal atrial fibrillation (HCC)   CAD (coronary artery disease)   HTN (hypertension)   HLD (hyperlipidemia)   Hypothyroidism   Acute respiratory failure secondary to bronchopneumonia Chest x-ray showed signs suggestive of bronchitis/ bronchopneumonia.  No evidence of sepsis.  Does not have fever or leukocytosis.   SARS-CoV-2 PCR negative.  Procalcitonin less than 0.10.  MRSA PCR negative, she was initially on IV vancomycin and cefepime.  Vancomycin has been discontinued.  Blood cultures negative in 5 days.  Patient had increased work of breathing and respiratory distress and was transferred to stepdown unit with BiPAP on 09/18/2022.  Received BiPAP overnight but currently on 5 L of oxygen and appears to be more comfortable.  We will continue with bronchodilators, IV diuretics.  Paroxysmal atrial fibrillation with RVR.  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  On Coreg 12.5 twice daily at home.  Will change to 25 twice daily.  On Cardizem drip.  Will wean as able.  If uncontrolled might need cardiology evaluation.  No recent echo in the computer.  Will order for 1.  Last TSH of 12.8, 4 weeks back..  Acute on chronic diastolic CHF Last 2D Echo done at [facility] on 07/23/2022 showing EF 50 to 55%.  On Lasix 20 mg daily.  Continue IV Lasix we will give 1 dose of 40 today.  Continue strict intake and output charting Daily weights.   Patient is negative balance for 2477 mL.    Mild acute kidney injury on CKD stage stage IIIa.  Has improved at this time.  Continue to monitor BMP.  Creatinine of 1.01 today from initial 1.7.  Acute hypoxemic respiratory failure Secondary to CHF and bronchopneumonia.  Currently requiring high flow oxygen.  Was on BiPAP overnight.  Chest x-ray 08/18/2022 showed increased interstitial densities.  Continue Solu-Medrol.  At baseline patient uses 2 L of oxygen with CPAP machine.  Currently on 5 L of oxygen by nasal cannula.  Normocytic  anemia Hemoglobin of 12.0.  Continue to monitor.  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  Follows up with oncology as outpatient.  DNR.  Okay with BiPAP.  We will continue to wean oxygen as able.  Continue steroids as well.   Type 1 diabetes with hyperglycemia Patient does have history of insulin pump and hypoglycemia.  Currently dose has been adjusted to Lantus 10 units in the morning and 5 units in the evening.  Will resume insulin pump on discharge.  Latest Hemoglobin A1c 8.8.  Insulin doses have been adjusted at this time secondary to steroids.  Hypomagnesemia.  We will give her 2 g of IV magnesium sulfate.   Chronic pain Continue home OxyContin   OSA Continue nightly CPAP   Paroxysmal A-fib: With rapid ventricular response today.. On Coreg and Xarelto.  Patient is anxious and in respiratory distress.  Might need Cardizem drip.  We will try 1 dose of IV Cardizem p.o.   CAD with stents:  Continue aspirin and statins.   Hypertension:  Continue Coreg Lasix.  Lisinopril currently on hold.   Hyperlipidemia Continue Lipitor   Hypothyroidism Continue Synthroid   GERD: Continue PPI   Morbid obesity Body mass index is 45.48 kg/m.  Would benefit from weight loss as outpatient.  Debility, deconditioning.   Patient was previously at the skilled nursing facility.  Currently has been seen by PT and recommend home health PT on discharge but appears is weak and deconditioned and debilitated.  Will need PT reevaluation.     DVT prophylaxis:  Rivaroxaban (XARELTO) tablet 15 mg   Code Status:     Code Status: DNR  Disposition: Uncertain at this time, initial plan Home with home health.  Status is: Inpatient  Remains inpatient appropriate because:  respiratory distress, hypoxic respiratory failure, in stepdown unit, required BiPAP.     Family Communication:   Spoke with the patient's husband 08/19/2022 and updated him about the  clinical condition of the patient.  Consultants:  None  Procedures:  BiPAP placement  Antimicrobials:  Cefepime IV  Anti-infectives (From admission, onward)    Start     Dose/Rate Route Frequency Ordered Stop   08/15/22 2000  vancomycin (VANCOREADY) IVPB 750 mg/150 mL  Status:  Discontinued        750 mg 150 mL/hr over 60 Minutes Intravenous Every 24 hours 08/14/22 1803 08/15/22 1713   08/15/22 0200  ceFEPIme (MAXIPIME) 2 g in sodium chloride 0.9 % 100 mL IVPB  Status:  Discontinued        2 g 200 mL/hr over 30 Minutes Intravenous Every 8 hours 08/15/22 0144 08/15/22 0146   08/15/22 0200  ceFEPIme (MAXIPIME) 2 g in sodium chloride 0.9 % 100 mL IVPB        2 g 200 mL/hr over 30 Minutes Intravenous Every 12 hours 08/15/22 0146     08/14/22 1845  vancomycin (VANCOCIN) 500 mg in sodium chloride 0.9 % 100 mL IVPB       See Hyperspace for full Linked Orders Report.   500 mg 100 mL/hr over 60 Minutes Intravenous  Once 08/14/22 1730 08/14/22 2220   08/14/22 1730  vancomycin (VANCOCIN) IVPB 1000 mg/200 mL premix       See Hyperspace for full Linked Orders Report.   1,000 mg 200 mL/hr over 60 Minutes Intravenous  Once 08/14/22 1730 08/14/22 2027   08/14/22 1715  ceFEPIme (MAXIPIME) 1 g in sodium chloride 0.9 % 100 mL IVPB        1 g 200 mL/hr over 30 Minutes Intravenous  Once 08/14/22 1702 08/14/22 1936      Subjective:  Today, patient was seen and examined at bedside.  Feels a little better with breathing.  No nausea vomiting anxiety fever or chills.  Has some cough.  Nursing staff reported tachycardia on Cardizem drip.  Has been urinating some.  Objective: Vitals:   08/19/22 0732 08/19/22 0740 08/19/22 0800 08/19/22 0803  BP:   (!) 152/96 (!) 152/96  Pulse: (!) 52 (!) 132 (!) 101 (!) 129  Resp: '15 17 15 18  '$ Temp:   (!) 97.4 F (36.3 C)   TempSrc:   Oral   SpO2: 97% 92% 96% 93%  Weight:      Height:        Intake/Output Summary (Last 24 hours) at 08/19/2022 1058 Last  data filed at 08/19/2022 0800 Gross per 24 hour  Intake 420.84 ml  Output 700 ml  Net -279.16 ml    Filed Weights   08/18/22 0500 08/18/22 1718 08/19/22 0553  Weight: 108.6 kg 103.4 kg 102.9 kg    Physical Examination: Body mass index is 41.49 kg/m.   General: Morbidly obese, not in obvious distress, weak and deconditioned, on nasal cannula oxygen HENT:   . Oral mucosa is moist.  Chest: Diminished breath sounds bilaterally.  Coarse breath sounds noted. CVS: S1 &S2 heard. No murmur.  Regular rate and rhythm. Abdomen: Soft, nontender, nondistended.  Bowel sounds are heard.   Extremities: No cyanosis, clubbing with trace peripheral edema.  Peripheral pulses are palpable. Psych: Alert, awake and oriented, much calmer today CNS:  No cranial nerve deficits.  Generalized weakness noted Skin: Warm and dry.  No rashes noted.  Data Reviewed:   CBC: Recent Labs  Lab 08/14/22 1643 08/15/22 0452 08/17/22 0529 08/18/22 0528 08/19/22 0854  WBC 4.3 5.4 5.6 5.4 6.3  NEUTROABS 3.1  --   --   --   --   HGB 10.7* 10.2* 9.5* 10.4* 12.0  HCT 33.1* 32.6* 29.9* 32.9* 37.9  MCV 95.4 97.6 98.0 96.5 97.4  PLT 227 187 172 177 189     Basic Metabolic Panel: Recent Labs  Lab 08/15/22 0452 08/16/22 0533 08/17/22 0529 08/18/22 0528 08/19/22 0854  NA 137 138 137 139 133*  K 4.8 4.0 4.0 3.5 4.0  CL 107 106 107 105 98  CO2 21* '26 26 27 26  '$ GLUCOSE 430* 177* 236* 207* 343*  BUN '13 19 17 15 15  '$ CREATININE 1.08* 1.70* 1.33* 1.07* 1.14*  CALCIUM 8.6* 8.6* 8.5* 8.6* 8.6*  MG  --   --  1.8 1.8 1.6*     Liver Function Tests: Recent Labs  Lab 08/15/22 0452  AST 24  ALT 16  ALKPHOS 106  BILITOT 1.1  PROT 5.9*  ALBUMIN 3.2*      Radiology Studies: DG CHEST PORT 1 VIEW  Result Date: 08/18/2022 CLINICAL DATA:  Wheezing. EXAM: PORTABLE CHEST 1 VIEW COMPARISON:  August 14, 2022. FINDINGS: Stable cardiomegaly. Mildly increased bilateral interstitial densities are noted concerning  for worsening edema or possibly pneumonia, right greater than left. Bony thorax is unremarkable. IMPRESSION:  Mildly increased bilateral interstitial densities are noted concerning for worsening edema or possibly pneumonia. Electronically Signed   By: Marijo Conception M.D.   On: 08/18/2022 15:11      LOS: 5 days    Flora Lipps, MD [facility] Available via Epic secure chat 7am-7pm After these hours, please refer to coverage provider listed on [email or web address] 08/19/2022, 10:58 AM

## 2022-08-19 NOTE — Progress Notes (Signed)
PT Cancellation Note  Patient Details Name: JOYCE HEITMAN MRN: [id number] DOB: 1941-07-13   Cancelled Treatment:    Reason Eval/Treat Not Completed: Medical issues which prohibited therapy, moved  to SDU, HR up. Will check back another time. Sugar Illias Pantano Office [phone] Weekend [phone]     Claretha Cooper 08/19/2022, 8:06 AM

## 2022-08-19 NOTE — Inpatient Diabetes Management (Signed)
Inpatient Diabetes Program Recommendations  AACE/ADA: New Consensus Statement on Inpatient Glycemic Control (2015)  Target Ranges:  Prepandial:   less than 140 mg/dL      Peak postprandial:   less than 180 mg/dL (1-2 hours)      Critically ill patients:  140 - 180 mg/dL   Lab Results  Component Value Date   GLUCAP 308 (H) 08/19/2022   HGBA1C 8.8 (H) 08/15/2022    Latest Reference Range & Units 08/18/22 12:07 08/18/22 17:39 08/18/22 21:52 08/19/22 04:06 08/19/22 07:49  Glucose-Capillary 70 - 99 mg/dL 211 (H) 214 (H) 284 (H) 281 (H) 308 (H)  (H): Data is abnormally high Review of Glycemic Control  Diabetes history: type 1 Outpatient Diabetes medications: insulin pump Current orders for Inpatient glycemic control: Semglee 10 units every am, Semglee 5 units at HS, Novolog SENSITIVE correction scale TID & Novolgo HS scale.  Inpatient Diabetes Program Recommendations:   Noted that blood sugars have been greater than 200 mg/dl. Noted that patient is on steroids.   Recommend increasing Semglee to 15 units in am, 10 units at HS.  If patient is eating consistently, consider adding Novolog 3 units TID with meals if patient eats at least 50% of meal.    Harvel Ricks RN BSN CDE Diabetes Coordinator Pager: [phone]  8am-5pm

## 2022-08-20 DIAGNOSIS — I4892 Unspecified atrial flutter: Secondary | ICD-10-CM | POA: Diagnosis not present

## 2022-08-20 DIAGNOSIS — I5033 Acute on chronic diastolic (congestive) heart failure: Secondary | ICD-10-CM | POA: Diagnosis not present

## 2022-08-20 DIAGNOSIS — J180 Bronchopneumonia, unspecified organism: Secondary | ICD-10-CM | POA: Diagnosis not present

## 2022-08-20 DIAGNOSIS — J9601 Acute respiratory failure with hypoxia: Secondary | ICD-10-CM | POA: Diagnosis not present

## 2022-08-20 DIAGNOSIS — G8929 Other chronic pain: Secondary | ICD-10-CM | POA: Diagnosis not present

## 2022-08-20 LAB — GLUCOSE, CAPILLARY
Glucose-Capillary: 256 mg/dL — ABNORMAL HIGH (ref 70–99)
Glucose-Capillary: 328 mg/dL — ABNORMAL HIGH (ref 70–99)
Glucose-Capillary: 360 mg/dL — ABNORMAL HIGH (ref 70–99)
Glucose-Capillary: 352 mg/dL — ABNORMAL HIGH (ref 70–99)
Glucose-Capillary: 301 mg/dL — ABNORMAL HIGH (ref 70–99)

## 2022-08-20 LAB — BASIC METABOLIC PANEL
Anion gap: 8 (ref 5–15)
BUN: 16 mg/dL (ref 8–23)
CO2: 28 mmol/L (ref 22–32)
Calcium: 8.7 mg/dL — ABNORMAL LOW (ref 8.9–10.3)
Chloride: 99 mmol/L (ref 98–111)
Creatinine, Ser: 1.1 mg/dL — ABNORMAL HIGH (ref 0.44–1.00)
GFR, Estimated: 51 mL/min — ABNORMAL LOW (ref >60)
Glucose, Bld: 266 mg/dL — ABNORMAL HIGH (ref 70–99)
Potassium: 3.7 mmol/L (ref 3.5–5.1)
Sodium: 135 mmol/L (ref 135–145)

## 2022-08-20 LAB — CBC
HCT: 34.6 % — ABNORMAL LOW (ref 36.0–46.0)
Hemoglobin: 11.3 g/dL — ABNORMAL LOW (ref 12.0–15.0)
MCH: 30.8 pg (ref 26.0–34.0)
MCHC: 32.7 g/dL (ref 30.0–36.0)
MCV: 94.3 fL (ref 80.0–100.0)
Platelets: 181 10*3/uL (ref 150–400)
RBC: 3.67 MIL/uL — ABNORMAL LOW (ref 3.87–5.11)
RDW: 13.9 % (ref 11.5–15.5)
WBC: 11.5 10*3/uL — ABNORMAL HIGH (ref 4.0–10.5)
nRBC: 0 % (ref 0.0–0.2)

## 2022-08-20 LAB — MAGNESIUM: Magnesium: 2 mg/dL (ref 1.7–2.4)

## 2022-08-20 MED ORDER — AMIODARONE HCL IN DEXTROSE 360-4.14 MG/200ML-% IV SOLN
60.0000 mg/h | INTRAVENOUS | Status: AC
  Administered 2022-08-20 (×2): 60 mg/h via INTRAVENOUS
  Filled 2022-08-20 (×2): qty 200

## 2022-08-20 MED ORDER — AMIODARONE HCL IN DEXTROSE 360-4.14 MG/200ML-% IV SOLN
30.0000 mg/h | INTRAVENOUS | Status: DC
  Administered 2022-08-20 – 2022-08-22 (×6): 30 mg/h via INTRAVENOUS
  Filled 2022-08-20 (×5): qty 200

## 2022-08-20 MED ORDER — LOSARTAN POTASSIUM 25 MG PO TABS
12.5000 mg | ORAL_TABLET | Freq: Every day | ORAL | Status: DC
  Administered 2022-08-20: 12.5 mg via ORAL
  Filled 2022-08-20 (×2): qty 0.5

## 2022-08-20 MED ORDER — AMIODARONE LOAD VIA INFUSION
150.0000 mg | Freq: Once | INTRAVENOUS | Status: AC
  Administered 2022-08-20: 150 mg via INTRAVENOUS
  Filled 2022-08-20: qty 83.34

## 2022-08-20 MED ORDER — FUROSEMIDE 10 MG/ML IJ SOLN
40.0000 mg | Freq: Once | INTRAMUSCULAR | Status: AC
  Administered 2022-08-20: 40 mg via INTRAVENOUS
  Filled 2022-08-20: qty 4

## 2022-08-20 NOTE — Consult Note (Signed)
Cardiology Consultation   Patient ID: LUREE PALLA MRN: [id number]; DOB: 1941/08/03  Admit date: 08/14/2022 Date of Consult: 08/20/2022  PCP:  Thomes Dinning, [place] [facility] Cardiologist:  [facility]}     Patient Profile:   SARABELLA CAPRIO is a 81 y.o. 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  History of Present Illness:   Ms. 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 Admitted with bronchopneumonia by the primary team 08/15/22 as well as acute on chronic HFpEF. Issues with atypical aflutter with RVR this admission, cardiology consulted    Followed at [facility] cardiology. "She had LCx stenting in 2005. In December 2014, she had a non-STEMI and had a bare-metal stenting to the LAD. In April 2015, she had a non-STEMI with early restenosis of her LAD stent. This was treated with a drug-eluting stent to the proximaland mid LAD".   Past Medical History:  Diagnosis Date   Arthritis    Atrial fibrillation (HCC)    Chronic pain    takes Opana daily and Percocet daily as needed   Colitis    Constipation    takes Carafate and Miralax daily   Coronary artery disease    Diabetes mellitus    has an Insulin Pump   DVT (deep venous thrombosis) (HCC)    Gastroparesis    GERD (gastroesophageal reflux disease)    High cholesterol    takes Crestor daily   History of blood clots    takes Xarelto and Brilinta daily    Hypertension    takes Coreg and Lisinopril daily   Hypothyroidism    takes Synthroid daily   MI (myocardial infarction) (HCC)    Muscle spasm    takes Tizanidine daily   Neuropathy    Pulmonary embolism (HCC)     Past Surgical History:  Procedure Laterality Date   ABDOMINAL HYSTERECTOMY     ANKLE SURGERY     cardiac stents     CARDIAC SURGERY     CARDIAC SURGERY     cateract surgery     JOINT REPLACEMENT     bilateral knees   TONSILLECTOMY     WRIST SURGERY        Inpatient Medications: Scheduled Meds:  aspirin  81 mg Oral Daily   atorvastatin  40 mg Oral Daily   carvedilol  25 mg Oral BID WC   Chlorhexidine Gluconate Cloth  6 each Topical Daily   guaiFENesin  600 mg Oral BID   insulin aspart  0-5 Units Subcutaneous QHS   insulin aspart  0-9 Units Subcutaneous TID WC   insulin aspart  5 Units Subcutaneous TID WC   insulin glargine-yfgn  10 Units Subcutaneous QHS   insulin glargine-yfgn  15 Units Subcutaneous q morning   levothyroxine  200 mcg Oral QAC breakfast   magnesium oxide  400 mg Oral BID   methylPREDNISolone (SOLU-MEDROL) injection  40 mg Intravenous Q12H   oxyCODONE  15 mg Oral Q12H   pantoprazole  80 mg Oral  Daily   Rivaroxaban  15 mg Oral Q supper   Continuous Infusions:  sodium chloride 5 mL/hr at 08/20/22 0800   sodium chloride 10 mL/hr at 08/20/22 0319   ceFEPime (MAXIPIME) IV Stopped (08/20/22 0356)   diltiazem (CARDIZEM) infusion 7.5 mg/hr (08/20/22 0800)   PRN Meds: sodium chloride, sodium chloride, albuterol, guaiFENesin-dextromethorphan, ondansetron (ZOFRAN) IV, oxyCODONE, prochlorperazine  Allergies:    Allergies  Allergen Reactions   Sulfur Rash and Hives   Clindamycin/Lincomycin Nausea And Vomiting   Sulfa Antibiotics Hives    Social History:   Social History   Socioeconomic History   Marital status: Married    Spouse name: Not on file   Number of children: 2   Years of education: Not on file   Highest education level: Not on  file  Occupational History   Not on file  Tobacco Use   Smoking status: Former    Packs/day: 0.50    Years: 15.00    Total pack years: 7.50    Types: Cigarettes    Quit date: 10/31/1970    Years since quitting: 51.8   Smokeless tobacco: Never  Substance and Sexual Activity   Alcohol use: No    Comment: she used to be a light weekend drinker before 1972   Drug use: No   Sexual activity: Not on file  Other Topics Concern   Not on file  Social History Narrative   Not on file   Social Determinants of Health   Financial Resource Strain: Not on file  Food Insecurity: No Food Insecurity (08/15/2022)   Hunger Vital Sign    Worried About Running Out of Food in the Last Year: Never true    [place] in the Last Year: Never true  Transportation Needs: Unknown (08/15/2022)   PRAPARE - Hydrologist (Medical): Patient refused    Lack of Transportation (Non-Medical): Patient refused  Physical Activity: Not on file  Stress: Not on file  Social Connections: Not on file  Intimate Partner Violence: Unknown (08/15/2022)   Humiliation, Afraid, Rape, and Kick questionnaire    Fear of Current or Ex-Partner: Patient refused    Emotionally Abused: Patient refused    Physically Abused: Patient refused    Sexually Abused: Patient refused    Family History:    Family History  Problem Relation Age of Onset   Cancer Mother 44       breast cancer   Cancer Brother 82       prostate cancer     ROS:  Please see the history of present illness.   All other ROS reviewed and negative.     Physical Exam/Data:   Vitals:   08/20/22 0600 08/20/22 0700 08/20/22 0800 08/20/22 0900  BP: 123/66 129/65 (!) 150/98 (!) 123/107  Pulse: 83 (!) 135 (!) 101 100  Resp: '15 17 19 16  '$ Temp:      TempSrc:      SpO2: 92% 95% 98% 94%  Weight:      Height:        Intake/Output Summary (Last 24 hours) at 08/20/2022 1012 Last data filed at 08/20/2022 0800 Gross per 24 hour   Intake 514.16 ml  Output 2000 ml  Net -1485.84 ml      08/20/2022    3:30 AM 08/19/2022    5:53 AM 08/18/2022    5:18 PM  Last 3 Weights  Weight (lbs) 227 lb 1.2 oz 226 lb 13.7 oz 227 lb  15.3 oz  Weight (kg) 103 kg 102.9 kg 103.4 kg     Body mass index is 41.53 kg/m.  General:  Well nourished, well developed, in no acute distress HEENT: normal Neck: no JVD Vascular: No carotid bruits; Distal pulses 2+ bilaterally Cardiac:  irreg, tachy Lungs: coarse bilaterally Abd: soft, nontender, no hepatomegaly  Ext: no edema Musculoskeletal:  No deformities, BUE and BLE strength normal and equal Skin: warm and dry  Neuro:  CNs 2-12 intact, no focal abnormalities noted Psych:  Normal affect     Laboratory Data:  High Sensitivity Troponin:   Recent Labs  Lab 08/14/22 1643 08/14/22 1932  TROPONINIHS 7 7     Chemistry Recent Labs  Lab 08/18/22 0528 08/19/22 0854 08/20/22 0322  NA 139 133* 135  K 3.5 4.0 3.7  CL 105 98 99  CO2 '27 26 28  '$ GLUCOSE 207* 343* 266*  BUN '15 15 16  '$ CREATININE 1.07* 1.14* 1.10*  CALCIUM 8.6* 8.6* 8.7*  MG 1.8 1.6* 2.0  GFRNONAA 53* 49* 51*  ANIONGAP '7 9 8    '$ Recent Labs  Lab 08/15/22 0452  PROT 5.9*  ALBUMIN 3.2*  AST 24  ALT 16  ALKPHOS 106  BILITOT 1.1   Lipids No results for input(s): "CHOL", "TRIG", "HDL", "LABVLDL", "LDLCALC", "CHOLHDL" in the last 168 hours.  Hematology Recent Labs  Lab 08/18/22 0528 08/19/22 0854 08/20/22 0322  WBC 5.4 6.3 11.5*  RBC 3.41* 3.89 3.67*  HGB 10.4* 12.0 11.3*  HCT 32.9* 37.9 34.6*  MCV 96.5 97.4 94.3  MCH 30.5 30.8 30.8  MCHC 31.6 31.7 32.7  RDW 14.1 14.1 13.9  PLT 177 189 181   Thyroid No results for input(s): "TSH", "FREET4" in the last 168 hours.  BNP Recent Labs  Lab 08/14/22 1643 08/16/22 0533  BNP 470.5* 469.4*    DDimer No results for input(s): "DDIMER" in the last 168 hours.   Radiology/Studies:  ECHOCARDIOGRAM COMPLETE  Result Date: 08/19/2022    ECHOCARDIOGRAM  REPORT   Patient Name:   Mercy Moore Date of Exam: 08/19/2022 Medical Rec #:  [id number]           Height:       62.0 in Accession #:    [id number]          Weight:       226.9 lb Date of Birth:  1941/07/19           BSA:          2.017 m Patient Age:    64 years            BP:           150/47 mmHg Patient Gender: F                   HR:           80 bpm. Exam Location:  Inpatient Procedure: 2D Echo, Cardiac Doppler and Color Doppler Indications:    CHF  History:        Patient has no prior history of Echocardiogram examinations.                 CHF, CAD; Risk Factors:Diabetes, Hypertension and Dyslipidemia.  Sonographer:    Danne Baxter RDCS, FE, PE Referring Phys: [id number] [place]  1. Hypokinesis of the mid portion of the LV; akinesis of the distal anterior, distal inferior and apical walls. . Left ventricular ejection fraction, by estimation, is 35 to  40%. The left ventricle has moderately decreased function. There is mild left ventricular hypertrophy. Left ventricular diastolic parameters are indeterminate.  2. Right ventricular systolic function is normal. The right ventricular size is mildly enlarged.  3. Left atrial size was severely dilated.  4. Right atrial size was mildly dilated.  5. Large pericardial effusion. The pericardial effusion is circumferential. Large pleural effusion.  6. Mild to moderate mitral valve regurgitation.  7. The aortic valve is tricuspid. Aortic valve regurgitation is not visualized. Aortic valve sclerosis/calcification is present, without any evidence of aortic stenosis.  8. The inferior vena cava is dilated in size with <50% respiratory variability, suggesting right atrial pressure of 15 mmHg. FINDINGS  Left Ventricle: Hypokinesis of the mid portion of the LV; akinesis of the distal anterior, distal inferior and apical walls. Left ventricular ejection fraction, by estimation, is 35 to 40%. The left ventricle has moderately decreased function. The left  ventricular internal cavity size was normal in size. There is mild left ventricular hypertrophy. Left ventricular diastolic parameters are indeterminate. Right Ventricle: The right ventricular size is mildly enlarged. Right vetricular wall thickness was not assessed. Right ventricular systolic function is normal. Left [facility]: Left atrial size was severely dilated. Right [facility]: Right atrial size was mildly dilated. Pericardium: A large pericardial effusion is present. The pericardial effusion is circumferential. Mitral Valve: There is mild thickening of the mitral valve leaflet(s). Mild to moderate mitral valve regurgitation. Tricuspid Valve: The tricuspid valve is normal in structure. Tricuspid valve regurgitation is mild. Aortic Valve: The aortic valve is tricuspid. Aortic valve regurgitation is not visualized. Aortic valve sclerosis/calcification is present, without any evidence of aortic stenosis. Pulmonic Valve: The pulmonic valve was normal in structure. Pulmonic valve regurgitation is not visualized. Aorta: The aortic root and ascending aorta are structurally normal, with no evidence of dilitation. Venous: The inferior vena cava is dilated in size with less than 50% respiratory variability, suggesting right atrial pressure of 15 mmHg. IAS/Shunts: No atrial level shunt detected by color flow Doppler. Additional Comments: There is a large pleural effusion.  LEFT VENTRICLE PLAX 2D LVIDd:         4.90 cm LVIDs:         3.90 cm LV PW:         1.30 cm LV IVS:        0.90 cm LVOT diam:     2.30 cm LV SV:         57 LV SV Index:   28 LVOT Area:     4.15 cm  RIGHT VENTRICLE RV S prime:     9.79 cm/s TAPSE (M-mode): 1.4 cm LEFT ATRIUM              Index        RIGHT ATRIUM           Index LA Vol (A2C):   113.0 ml 56.03 ml/m  RA Area:     22.90 cm LA Vol (A4C):   146.0 ml 72.39 ml/m  RA Volume:   68.30 ml  33.86 ml/m LA Biplane Vol: 138.0 ml 68.42 ml/m  AORTIC VALVE LVOT Vmax:   75.50 cm/s LVOT Vmean:  54.700 cm/s  LVOT VTI:    0.138 m  AORTA Ao Root diam: 3.40 cm Ao Asc diam:  3.50 cm TRICUSPID VALVE TR Peak grad:   21.5 mmHg TR Vmax:        232.00 cm/s  SHUNTS Systemic VTI:  0.14 m Systemic Diam: 2.30 cm Nevin Bloodgood  Harrington Challenger MD Electronically signed by Dorris Carnes MD Signature Date/Time: 08/19/2022/6:14:36 PM    Final    DG CHEST PORT 1 VIEW  Result Date: 08/18/2022 CLINICAL DATA:  Wheezing. EXAM: PORTABLE CHEST 1 VIEW COMPARISON:  August 14, 2022. FINDINGS: Stable cardiomegaly. Mildly increased bilateral interstitial densities are noted concerning for worsening edema or possibly pneumonia, right greater than left. Bony thorax is unremarkable. IMPRESSION: Mildly increased bilateral interstitial densities are noted concerning for worsening edema or possibly pneumonia. Electronically Signed   By: Marijo Conception M.D.   On: 08/18/2022 15:11     Assessment and Plan:   1.Paroxsyam afib/aflutter - [facility] - issues with high rates this admission in setting of ongoing hypoxia, bronchopneumonia, HF - at home on coreg 12.'5mg'$  bid at home - started on dilt gtt here, on coreg '25mg'$  bid here. BP's are tolerating.   - with drop in LVEF would look to get off dilt gtt. Start IV amiodarone. Continue oral coreg.     2. Combined systolic/diastolic HF - 02/9740 echo [facility]: LVEF 50-55% - 07/2022 echo: LVEF 63-84%, indet diasotlic fxn, normal RV, severe LAE  CXR mildly increased bilatearl interstitial densities, edema vs pneumonai BNP 469   -medical therapy with coreg '25mg'$  bid. Start losartan 12.'5mg'$  daily, likely transition to entresto later in admit. SGLT2i and MRA later in admit - dose IV lasix '40mg'$  x1 today  - new drop in LVEF, perhaps stress induced CM in setting of systemic illness or tachy mediated. She does have history of CAD as well. - would favor medical therapy with repeat echo at this time as opposed to repeat ischemic evaluation. Respiratory status would also not be stable for cath at this  time.      3.Pericardial effusion - large effusion by echo, no evidence of tamponade - repeat limited study tomorrow.   4. CAD - history of stenting as reported above -has bene on ASA and xarelto as outpatient, defer to her primary cardiologist    For questions or updates, please contact [place] Please consult [email or web address] for contact info under    Signed, Carlyle Dolly, MD  08/20/2022 10:12 AM

## 2022-08-20 NOTE — Progress Notes (Addendum)
PROGRESS NOTE    Debra Ryan  KNL:[id number] DOB: 1941/04/14 DOA: 08/14/2022 PCP: Thomes Dinning, MD    Brief Narrative:  Debra Ryan is a 81 y.o. 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  Of note, patient was recently admitted to [facility] 9/22-9/28 for pneumonia decompensated CHF, hypoglycemia, and UTI.  She was discharged to SNF and returned home 2 days ago.  After returning home, patient continued to have shortness of breath, wheezing, nonproductive cough.    In the ED, patient was afebrile but was hypoxic with pulse ox of 89% on room air and was placed on 2 L of nasal cannula oxygen.WBC was 4.3.  BUN 9, creatinine 1.03.  BNP 470.5.  High sensitive troponin 7.  Lactic acid 1.2.  COVID-19 PCR negative.  Chest x-ray with bronchial wall thickening with mild patchy opacities in the lung bases suggestive of bronchitis and possible bronchopneumonia, bilateral interstitial thickening consistent with mild component of congestive heart failure.  Patient was given bronchodilator treatments,vancomycin and cefepime and was considered for admission to hospital for further evaluation and treatment.  During hospitalization, patient had acute respiratory distress and was transferred to stepdown unit initially on BiPAP which was subsequently changed to 5 L of nasal cannula oxygen.  Patient also had atrial fibrillation with rapid ventricular response and cardiology was consulted.   Assessment & Plan:  Principal  Problem:   Bronchopneumonia Active Problems:   Acute respiratory failure with hypoxia (HCC)   Acute on chronic diastolic CHF (congestive heart failure) (HCC)   Normocytic anemia   Type 1 diabetes (HCC)   Chronic pain   OSA (obstructive sleep apnea)   CKD (chronic kidney disease) stage 3, GFR 30-59 ml/min (HCC)   Paroxysmal atrial fibrillation (HCC)   CAD (coronary artery disease)   HTN (hypertension)   HLD (hyperlipidemia)   Hypothyroidism   Bronchopneumonia Chest x-ray showed signs suggestive of bronchitis/ bronchopneumonia.  No evidence of sepsis.  Does not have fever or leukocytosis.   SARS-CoV-2 PCR negative.  Procalcitonin less than 0.10.  MRSA PCR negative, she was initially on IV vancomycin and cefepime.  Vancomycin has been discontinued.  Blood cultures negative in 5 days.  Was on BiPAP which has been transitioned to nasal cannula oxygen at 5 L/min.  Continue bronchodilators, diuretics and IV steroids.  Feels a little better today.  We will continue to wean as able.    Acute hypoxemic respiratory failure Secondary to CHF and bronchopneumonia.  Currently requiring high flow oxygen.  Was on BiPAP overnight.  Chest x-ray 08/18/2022 showed increased interstitial densities.  Continue Solu-Medrol IV.  At baseline, patient uses 2 L of oxygen with CPAP machine.  Currently on 5 L of oxygen by nasal cannula.  Paroxysmal atrial fibrillation with RVR.    On Coreg 12.5 twice daily at home.  Have changed Coreg to 25 twice daily.  On Cardizem drip but LV ejection fraction is noted to be low so cardiology has been consulted.  At this time patient has been started on amiodarone Drip.  Follow cardiology recommendations.  Acute on chronic diastolic CHF, new reduced LV  ejection fraction Last 2D Echo done at [facility] on 07/23/2022 showing EF 50 to 55%.  Repeat 2D echocardiogram on 08/19/2022 showed LV ejection fraction of 35 to 40%. Continue strict intake and output charting Daily weights.  Received IV  Lasix.  Will receive additional IV today.  Cardiology on board.  Patient is negative balance for 3868 ml  Mild acute kidney injury on CKD stage stage IIIa.  Has improved at this time.  Continue to monitor BMP.  Creatinine of 1.1 today from initial 1.7.  Normocytic anemia Hemoglobin of 11.3.  Continue to monitor.  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  Follows up with oncology as outpatient.  DNR.  On supplemental oxygen at this time.  Continue IV steroids.    Type 1 diabetes with hyperglycemia Patient does have history of insulin pump and hypoglycemia.  Currently dose has been adjusted to Lantus 15 units in the morning and 10 units in the evening with sliding scale insulin..  Will resume insulin pump on discharge.  Latest Hemoglobin A1c 8.8.  Insulin doses have been adjusted at this time secondary to steroids.  Hypomagnesemia.  Received IV magnesium culprit.  Magnesium today at 2.0..   Chronic pain Continue home OxyContin   OSA Continue nightly CPAP   CAD with stents:  Continue aspirin and statins.   Hypertension:  Continue Coreg Lasix.  Losartan has been initiated.   Hyperlipidemia Continue Lipitor   Hypothyroidism Continue Synthroid   GERD: Continue Protonix.   Morbid obesity Body mass index is 45.48 kg/m.  Would benefit from weight loss as outpatient.  Debility, deconditioning.  Will likely need skilled nursing facility placement.  Current recommendation is home health.     DVT prophylaxis:  Rivaroxaban (XARELTO) tablet 15 mg   Code Status:     Code Status: DNR  Disposition: Uncertain at this time, initial plan was Home with home health.  Status is: Inpatient  Remains inpatient appropriate because:  respiratory distress, hypoxic respiratory failure, in stepdown unit, on BiPAP amiodarone drip,.     Family Communication:   Spoke with the patient's husband 08/19/2022 and updated him about the clinical condition of  the patient.  Consultants:  Cardiology  Procedures:  BiPAP placement  Antimicrobials:  Cefepime IV  Anti-infectives (From admission, onward)    Start     Dose/Rate Route Frequency Ordered Stop   08/15/22 2000  vancomycin (VANCOREADY) IVPB 750 mg/150 mL  Status:  Discontinued        750 mg 150 mL/hr over 60 Minutes Intravenous Every 24 hours 08/14/22 1803 08/15/22 1713   08/15/22 0200  ceFEPIme (MAXIPIME) 2 g in sodium chloride 0.9 % 100 mL IVPB  Status:  Discontinued        2 g 200 mL/hr over 30 Minutes Intravenous Every 8 hours 08/15/22 0144 08/15/22 0146   08/15/22 0200  ceFEPIme (MAXIPIME) 2 g in sodium chloride 0.9 % 100 mL IVPB        2 g 200 mL/hr over 30 Minutes Intravenous Every 12 hours 08/15/22 0146 08/20/22 2359   08/14/22 1845  vancomycin (VANCOCIN) 500 mg in sodium chloride 0.9 % 100 mL IVPB       See Hyperspace for full Linked Orders Report.   500 mg 100 mL/hr over 60 Minutes Intravenous  Once 08/14/22 1730 08/14/22 2220   08/14/22 1730  vancomycin (VANCOCIN) IVPB 1000 mg/200 mL premix       See Hyperspace for full Linked Orders Report.   1,000  mg 200 mL/hr over 60 Minutes Intravenous  Once 08/14/22 1730 08/14/22 2027   08/14/22 1715  ceFEPIme (MAXIPIME) 1 g in sodium chloride 0.9 % 100 mL IVPB        1 g 200 mL/hr over 30 Minutes Intravenous  Once 08/14/22 1702 08/14/22 1936      Subjective:  Today, patient was seen and examined at bedside.  Feels a little better with breathing today.  Denies any chest pain, wheezing, increasing shortness of breath today.  No nausea vomiting or diarrhea.    Objective: Vitals:   08/20/22 0700 08/20/22 0800 08/20/22 0900 08/20/22 1000  BP: 129/65 (!) 150/98 (!) 123/107 133/80  Pulse: (!) 135 (!) 101 100 (!) 107  Resp: '17 19 16 20  '$ Temp:      TempSrc:      SpO2: 95% 98% 94% 97%  Weight:      Height:        Intake/Output Summary (Last 24 hours) at 08/20/2022 1212 Last data filed at 08/20/2022 0800 Gross per 24 hour   Intake 489.17 ml  Output 2000 ml  Net -1510.83 ml    Filed Weights   08/18/22 1718 08/19/22 0553 08/20/22 0330  Weight: 103.4 kg 102.9 kg 103 kg    Physical Examination: Body mass index is 41.53 kg/m.   General: Morbidly obese built,, not in obvious distress, weak and deconditioned, on nasal cannula oxygen at 5 L/min. HENT:   No scleral pallor or icterus noted. Oral mucosa is moist.  Chest:    Diminished breath sounds bilaterally.  Coarse breath sounds noted. CVS: S1 &S2 heard. No murmur.  Regular rate and rhythm. Abdomen: Soft, nontender, nondistended.  Bowel sounds are heard.   Extremities: No cyanosis, clubbing with trace peripheral edema.  Peripheral pulses are palpable. Psych: Alert, awake and oriented, appears to be more calmer. CNS:  No cranial nerve deficits.  Generalized weakness noted. Skin: Warm and dry.  No rashes noted.   Data Reviewed:   CBC: Recent Labs  Lab 08/14/22 1643 08/15/22 0452 08/17/22 0529 08/18/22 0528 08/19/22 0854 08/20/22 0322  WBC 4.3 5.4 5.6 5.4 6.3 11.5*  NEUTROABS 3.1  --   --   --   --   --   HGB 10.7* 10.2* 9.5* 10.4* 12.0 11.3*  HCT 33.1* 32.6* 29.9* 32.9* 37.9 34.6*  MCV 95.4 97.6 98.0 96.5 97.4 94.3  PLT 227 187 172 177 189 181     Basic Metabolic Panel: Recent Labs  Lab 08/16/22 0533 08/17/22 0529 08/18/22 0528 08/19/22 0854 08/20/22 0322  NA 138 137 139 133* 135  K 4.0 4.0 3.5 4.0 3.7  CL 106 107 105 98 99  CO2 '26 26 27 26 28  '$ GLUCOSE 177* 236* 207* 343* 266*  BUN '19 17 15 15 16  '$ CREATININE 1.70* 1.33* 1.07* 1.14* 1.10*  CALCIUM 8.6* 8.5* 8.6* 8.6* 8.7*  MG  --  1.8 1.8 1.6* 2.0     Liver Function Tests: Recent Labs  Lab 08/15/22 0452  AST 24  ALT 16  ALKPHOS 106  BILITOT 1.1  PROT 5.9*  ALBUMIN 3.2*      Radiology Studies: ECHOCARDIOGRAM COMPLETE  Result Date: 08/19/2022    ECHOCARDIOGRAM REPORT   Patient Name:   Debra Ryan Date of Exam: 08/19/2022 Medical Rec #:  [id number]            Height:       62.0 in Accession #:    [id number]  Weight:       226.9 lb Date of Birth:  1941-02-14           BSA:          2.017 m Patient Age:    89 years            BP:           150/47 mmHg Patient Gender: F                   HR:           80 bpm. Exam Location:  Inpatient Procedure: 2D Echo, Cardiac Doppler and Color Doppler Indications:    CHF  History:        Patient has no prior history of Echocardiogram examinations.                 CHF, CAD; Risk Factors:Diabetes, Hypertension and Dyslipidemia.  Sonographer:    Danne Baxter RDCS, FE, PE Referring Phys: [id number] [place]  1. Hypokinesis of the mid portion of the LV; akinesis of the distal anterior, distal inferior and apical walls. . Left ventricular ejection fraction, by estimation, is 35 to 40%. The left ventricle has moderately decreased function. There is mild left ventricular hypertrophy. Left ventricular diastolic parameters are indeterminate.  2. Right ventricular systolic function is normal. The right ventricular size is mildly enlarged.  3. Left atrial size was severely dilated.  4. Right atrial size was mildly dilated.  5. Large pericardial effusion. The pericardial effusion is circumferential. Large pleural effusion.  6. Mild to moderate mitral valve regurgitation.  7. The aortic valve is tricuspid. Aortic valve regurgitation is not visualized. Aortic valve sclerosis/calcification is present, without any evidence of aortic stenosis.  8. The inferior vena cava is dilated in size with <50% respiratory variability, suggesting right atrial pressure of 15 mmHg. FINDINGS  Left Ventricle: Hypokinesis of the mid portion of the LV; akinesis of the distal anterior, distal inferior and apical walls. Left ventricular ejection fraction, by estimation, is 35 to 40%. The left ventricle has moderately decreased function. The left ventricular internal cavity size was normal in size. There is mild left ventricular hypertrophy. Left  ventricular diastolic parameters are indeterminate. Right Ventricle: The right ventricular size is mildly enlarged. Right vetricular wall thickness was not assessed. Right ventricular systolic function is normal. Left Atrium: Left atrial size was severely dilated. Right Atrium: Right atrial size was mildly dilated. Pericardium: A large pericardial effusion is present. The pericardial effusion is circumferential. Mitral Valve: There is mild thickening of the mitral valve leaflet(s). Mild to moderate mitral valve regurgitation. Tricuspid Valve: The tricuspid valve is normal in structure. Tricuspid valve regurgitation is mild. Aortic Valve: The aortic valve is tricuspid. Aortic valve regurgitation is not visualized. Aortic valve sclerosis/calcification is present, without any evidence of aortic stenosis. Pulmonic Valve: The pulmonic valve was normal in structure. Pulmonic valve regurgitation is not visualized. Aorta: The aortic root and ascending aorta are structurally normal, with no evidence of dilitation. Venous: The inferior vena cava is dilated in size with less than 50% respiratory variability, suggesting right atrial pressure of 15 mmHg. IAS/Shunts: No atrial level shunt detected by color flow Doppler. Additional Comments: There is a large pleural effusion.  LEFT VENTRICLE PLAX 2D LVIDd:         4.90 cm LVIDs:         3.90 cm LV PW:         1.30 cm LV IVS:  0.90 cm LVOT diam:     2.30 cm LV SV:         57 LV SV Index:   28 LVOT Area:     4.15 cm  RIGHT VENTRICLE RV S prime:     9.79 cm/s TAPSE (M-mode): 1.4 cm LEFT ATRIUM              Index        RIGHT ATRIUM           Index LA Vol (A2C):   113.0 ml 56.03 ml/m  RA Area:     22.90 cm LA Vol (A4C):   146.0 ml 72.39 ml/m  RA Volume:   68.30 ml  33.86 ml/m LA Biplane Vol: 138.0 ml 68.42 ml/m  AORTIC VALVE LVOT Vmax:   75.50 cm/s LVOT Vmean:  54.700 cm/s LVOT VTI:    0.138 m  AORTA Ao Root diam: 3.40 cm Ao Asc diam:  3.50 cm TRICUSPID VALVE TR Peak grad:    21.5 mmHg TR Vmax:        232.00 cm/s  SHUNTS Systemic VTI:  0.14 m Systemic Diam: 2.30 cm Dorris Carnes MD Electronically signed by Dorris Carnes MD Signature Date/Time: 08/19/2022/6:14:36 PM    Final    DG CHEST PORT 1 VIEW  Result Date: 08/18/2022 CLINICAL DATA:  Wheezing. EXAM: PORTABLE CHEST 1 VIEW COMPARISON:  August 14, 2022. FINDINGS: Stable cardiomegaly. Mildly increased bilateral interstitial densities are noted concerning for worsening edema or possibly pneumonia, right greater than left. Bony thorax is unremarkable. IMPRESSION: Mildly increased bilateral interstitial densities are noted concerning for worsening edema or possibly pneumonia. Electronically Signed   By: Marijo Conception M.D.   On: 08/18/2022 15:11      LOS: 6 days    Flora Lipps, MD [facility] Available via Epic secure chat 7am-7pm After these hours, please refer to coverage provider listed on [email or web address] 08/20/2022, 12:12 PM

## 2022-08-21 ENCOUNTER — Inpatient Hospital Stay (HOSPITAL_COMMUNITY): Payer: Medicare Other

## 2022-08-21 DIAGNOSIS — J180 Bronchopneumonia, unspecified organism: Secondary | ICD-10-CM | POA: Diagnosis not present

## 2022-08-21 DIAGNOSIS — I5021 Acute systolic (congestive) heart failure: Secondary | ICD-10-CM

## 2022-08-21 DIAGNOSIS — I5033 Acute on chronic diastolic (congestive) heart failure: Secondary | ICD-10-CM | POA: Diagnosis not present

## 2022-08-21 DIAGNOSIS — G8929 Other chronic pain: Secondary | ICD-10-CM | POA: Diagnosis not present

## 2022-08-21 DIAGNOSIS — I3139 Other pericardial effusion (noninflammatory): Secondary | ICD-10-CM | POA: Diagnosis not present

## 2022-08-21 DIAGNOSIS — I4891 Unspecified atrial fibrillation: Secondary | ICD-10-CM

## 2022-08-21 DIAGNOSIS — J9601 Acute respiratory failure with hypoxia: Secondary | ICD-10-CM | POA: Diagnosis not present

## 2022-08-21 LAB — CBC
HCT: 34.8 % — ABNORMAL LOW (ref 36.0–46.0)
Hemoglobin: 11.2 g/dL — ABNORMAL LOW (ref 12.0–15.0)
MCH: 30.2 pg (ref 26.0–34.0)
MCHC: 32.2 g/dL (ref 30.0–36.0)
MCV: 93.8 fL (ref 80.0–100.0)
Platelets: 189 10*3/uL (ref 150–400)
RBC: 3.71 MIL/uL — ABNORMAL LOW (ref 3.87–5.11)
RDW: 14.1 % (ref 11.5–15.5)
WBC: 13.4 10*3/uL — ABNORMAL HIGH (ref 4.0–10.5)
nRBC: 0 % (ref 0.0–0.2)

## 2022-08-21 LAB — BASIC METABOLIC PANEL
Anion gap: 11 (ref 5–15)
BUN: 27 mg/dL — ABNORMAL HIGH (ref 8–23)
CO2: 25 mmol/L (ref 22–32)
Calcium: 8.7 mg/dL — ABNORMAL LOW (ref 8.9–10.3)
Chloride: 99 mmol/L (ref 98–111)
Creatinine, Ser: 1.26 mg/dL — ABNORMAL HIGH (ref 0.44–1.00)
GFR, Estimated: 43 mL/min — ABNORMAL LOW (ref >60)
Glucose, Bld: 313 mg/dL — ABNORMAL HIGH (ref 70–99)
Potassium: 4.2 mmol/L (ref 3.5–5.1)
Sodium: 135 mmol/L (ref 135–145)

## 2022-08-21 LAB — ECHOCARDIOGRAM LIMITED
Area-P 1/2: 4.94 cm2
Calc EF: 37.1 %
Height: 62 in
MV M vel: 5.35 m/s
MV Peak grad: 114.3 mmHg
Radius: 0.7 cm
S' Lateral: 4.2 cm
Single Plane A2C EF: 32.7 %
Single Plane A4C EF: 38.1 %
Weight: 3724.89 oz

## 2022-08-21 LAB — GLUCOSE, CAPILLARY
Glucose-Capillary: 313 mg/dL — ABNORMAL HIGH (ref 70–99)
Glucose-Capillary: 328 mg/dL — ABNORMAL HIGH (ref 70–99)
Glucose-Capillary: 260 mg/dL — ABNORMAL HIGH (ref 70–99)
Glucose-Capillary: 263 mg/dL — ABNORMAL HIGH (ref 70–99)
Glucose-Capillary: 265 mg/dL — ABNORMAL HIGH (ref 70–99)

## 2022-08-21 LAB — C-REACTIVE PROTEIN: CRP: <0.5 mg/dL (ref <1.0)

## 2022-08-21 LAB — MAGNESIUM: Magnesium: 2.1 mg/dL (ref 1.7–2.4)

## 2022-08-21 LAB — TSH: TSH: 0.956 u[IU]/mL (ref 0.350–4.500)

## 2022-08-21 LAB — SEDIMENTATION RATE: Sed Rate: 9 mm/hr (ref 0–22)

## 2022-08-21 LAB — BRAIN NATRIURETIC PEPTIDE: B Natriuretic Peptide: 461.9 pg/mL — ABNORMAL HIGH (ref 0.0–100.0)

## 2022-08-21 MED ORDER — AMIODARONE LOAD VIA INFUSION
150.0000 mg | Freq: Once | INTRAVENOUS | Status: AC
  Administered 2022-08-21: 150 mg via INTRAVENOUS
  Filled 2022-08-21: qty 83.34

## 2022-08-21 MED ORDER — INSULIN ASPART 100 UNIT/ML IJ SOLN
7.0000 [IU] | Freq: Three times a day (TID) | INTRAMUSCULAR | Status: DC
  Administered 2022-08-21 – 2022-08-24 (×10): 7 [IU] via SUBCUTANEOUS

## 2022-08-21 MED ORDER — SACUBITRIL-VALSARTAN 24-26 MG PO TABS
1.0000 | ORAL_TABLET | Freq: Two times a day (BID) | ORAL | Status: DC
  Administered 2022-08-21 – 2022-08-25 (×9): 1 via ORAL
  Filled 2022-08-21 (×9): qty 1

## 2022-08-21 MED ORDER — INSULIN GLARGINE-YFGN 100 UNIT/ML ~~LOC~~ SOLN
17.0000 [IU] | Freq: Every morning | SUBCUTANEOUS | Status: DC
  Administered 2022-08-22 – 2022-08-24 (×3): 17 [IU] via SUBCUTANEOUS
  Filled 2022-08-21 (×4): qty 0.17

## 2022-08-21 MED ORDER — STERILE WATER FOR INJECTION IJ SOLN
INTRAMUSCULAR | Status: AC
  Administered 2022-08-21: 10 mL
  Filled 2022-08-21: qty 10

## 2022-08-21 MED ORDER — METHYLPREDNISOLONE SODIUM SUCC 40 MG IJ SOLR
40.0000 mg | Freq: Every day | INTRAMUSCULAR | Status: DC
  Administered 2022-08-22 – 2022-08-23 (×2): 40 mg via INTRAVENOUS
  Filled 2022-08-21 (×2): qty 1

## 2022-08-21 NOTE — Progress Notes (Signed)
*  PRELIMINARY RESULTS* Echocardiogram 2D Echocardiogram has been performed.  Debra Ryan 08/21/2022, 11:13 AM

## 2022-08-21 NOTE — Progress Notes (Signed)
Rounding Note    Patient Name: Debra Ryan Date of Encounter: 08/21/2022  [place] Cardiologist: [facility]  Subjective   Breathing is improving.   Inpatient Medications    Scheduled Meds:  aspirin  81 mg Oral Daily   atorvastatin  40 mg Oral Daily   carvedilol  25 mg Oral BID WC   Chlorhexidine Gluconate Cloth  6 each Topical Daily   guaiFENesin  600 mg Oral BID   insulin aspart  0-5 Units Subcutaneous QHS   insulin aspart  0-9 Units Subcutaneous TID WC   insulin aspart  5 Units Subcutaneous TID WC   insulin glargine-yfgn  10 Units Subcutaneous QHS   insulin glargine-yfgn  15 Units Subcutaneous q morning   levothyroxine  200 mcg Oral QAC breakfast   losartan  12.5 mg Oral Daily   magnesium oxide  400 mg Oral BID   methylPREDNISolone (SOLU-MEDROL) injection  40 mg Intravenous Q12H   oxyCODONE  15 mg Oral Q12H   pantoprazole  80 mg Oral Daily   Rivaroxaban  15 mg Oral Q supper   Continuous Infusions:  sodium chloride Stopped (08/20/22 1514)   sodium chloride 10 mL/hr at 08/20/22 0319   amiodarone 30 mg/hr (08/21/22 0530)   PRN Meds: sodium chloride, sodium chloride, albuterol, guaiFENesin-dextromethorphan, ondansetron (ZOFRAN) IV, oxyCODONE, prochlorperazine   Vital Signs    Vitals:   08/21/22 0330 08/21/22 0400 08/21/22 0500 08/21/22 0530  BP:  138/71 134/68   Pulse:  86 95   Resp:  12 14   Temp: (!) 97.2 F (36.2 C)   97.6 F (36.4 C)  TempSrc: Axillary     SpO2:  96% 99%   Weight:   105.6 kg   Height:        Intake/Output Summary (Last 24 hours) at 08/21/2022 0714 Last data filed at 08/21/2022 0530 Gross per 24 hour  Intake 557.21 ml  Output 1050 ml  Net -492.79 ml      08/21/2022    5:00 AM 08/20/2022    3:30 AM 08/19/2022    5:53 AM  Last 3 Weights  Weight (lbs) 232 lb 12.9 oz 227 lb 1.2 oz 226 lb 13.7 oz  Weight (kg) 105.6 kg 103 kg 102.9 kg      Telemetry    Afib varaible rates - Personally  Reviewed  ECG    N/a - Personally Reviewed  Physical Exam   GEN: No acute distress.   Neck: No JVD Cardiac: irreg, tachy  Respiratory: mild crackles bilateral bases GI: Soft, nontender, non-distended  MS: No edema; No deformity. Neuro:  Nonfocal  Psych: Normal affect   Labs    High Sensitivity Troponin:   Recent Labs  Lab 08/14/22 1643 08/14/22 1932  TROPONINIHS 7 7     Chemistry Recent Labs  Lab 08/15/22 0452 08/16/22 0533 08/18/22 0528 08/19/22 0854 08/20/22 0322 08/21/22 0323  NA 137   < > 139 133* 135  --   K 4.8   < > 3.5 4.0 3.7  --   CL 107   < > 105 98 99  --   CO2 21*   < > '27 26 28  '$ --   GLUCOSE 430*   < > 207* 343* 266*  --   BUN 13   < > '15 15 16  '$ --   CREATININE 1.08*   < > 1.07* 1.14* 1.10*  --   CALCIUM 8.6*   < > 8.6* 8.6* 8.7*  --  MG  --    < > 1.8 1.6* 2.0 2.1  PROT 5.9*  --   --   --   --   --   ALBUMIN 3.2*  --   --   --   --   --   AST 24  --   --   --   --   --   ALT 16  --   --   --   --   --   ALKPHOS 106  --   --   --   --   --   BILITOT 1.1  --   --   --   --   --   GFRNONAA 52*   < > 53* 49* 51*  --   ANIONGAP 9   < > '7 9 8  '$ --    < > = values in this interval not displayed.    Lipids No results for input(s): "CHOL", "TRIG", "HDL", "LABVLDL", "LDLCALC", "CHOLHDL" in the last 168 hours.  Hematology Recent Labs  Lab 08/19/22 0854 08/20/22 0322 08/21/22 0323  WBC 6.3 11.5* 13.4*  RBC 3.89 3.67* 3.71*  HGB 12.0 11.3* 11.2*  HCT 37.9 34.6* 34.8*  MCV 97.4 94.3 93.8  MCH 30.8 30.8 30.2  MCHC 31.7 32.7 32.2  RDW 14.1 13.9 14.1  PLT 189 181 189   Thyroid  Recent Labs  Lab 08/21/22 0323  TSH 0.956    BNP Recent Labs  Lab 08/14/22 1643 08/16/22 0533 08/21/22 0323  BNP 470.5* 469.4* 461.9*    DDimer No results for input(s): "DDIMER" in the last 168 hours.   Radiology    ECHOCARDIOGRAM COMPLETE  Result Date: 08/19/2022    ECHOCARDIOGRAM REPORT   Patient Name:   Debra Ryan Date of Exam: 08/19/2022  Medical Rec #:  [id number]           Height:       62.0 in Accession #:    [id number]          Weight:       226.9 lb Date of Birth:  1941/08/01           BSA:          2.017 m Patient Age:    13 years            BP:           150/47 mmHg Patient Gender: F                   HR:           80 bpm. Exam Location:  Inpatient Procedure: 2D Echo, Cardiac Doppler and Color Doppler Indications:    CHF  History:        Patient has no prior history of Echocardiogram examinations.                 CHF, CAD; Risk Factors:Diabetes, Hypertension and Dyslipidemia.  Sonographer:    Danne Baxter RDCS, FE, PE Referring Phys: [phone] [place]  1. Hypokinesis of the mid portion of the LV; akinesis of the distal anterior, distal inferior and apical walls. . Left ventricular ejection fraction, by estimation, is 35 to 40%. The left ventricle has moderately decreased function. There is mild left ventricular hypertrophy. Left ventricular diastolic parameters are indeterminate.  2. Right ventricular systolic function is normal. The right ventricular size is mildly enlarged.  3. Left atrial size was severely dilated.  4. Right atrial  size was mildly dilated.  5. Large pericardial effusion. The pericardial effusion is circumferential. Large pleural effusion.  6. Mild to moderate mitral valve regurgitation.  7. The aortic valve is tricuspid. Aortic valve regurgitation is not visualized. Aortic valve sclerosis/calcification is present, without any evidence of aortic stenosis.  8. The inferior vena cava is dilated in size with <50% respiratory variability, suggesting right atrial pressure of 15 mmHg. FINDINGS  Left Ventricle: Hypokinesis of the mid portion of the LV; akinesis of the distal anterior, distal inferior and apical walls. Left ventricular ejection fraction, by estimation, is 35 to 40%. The left ventricle has moderately decreased function. The left ventricular internal cavity size was normal in size. There is mild left  ventricular hypertrophy. Left ventricular diastolic parameters are indeterminate. Right Ventricle: The right ventricular size is mildly enlarged. Right vetricular wall thickness was not assessed. Right ventricular systolic function is normal. Left [facility]: Left atrial size was severely dilated. Right [facility]: Right atrial size was mildly dilated. Pericardium: A large pericardial effusion is present. The pericardial effusion is circumferential. Mitral Valve: There is mild thickening of the mitral valve leaflet(s). Mild to moderate mitral valve regurgitation. Tricuspid Valve: The tricuspid valve is normal in structure. Tricuspid valve regurgitation is mild. Aortic Valve: The aortic valve is tricuspid. Aortic valve regurgitation is not visualized. Aortic valve sclerosis/calcification is present, without any evidence of aortic stenosis. Pulmonic Valve: The pulmonic valve was normal in structure. Pulmonic valve regurgitation is not visualized. Aorta: The aortic root and ascending aorta are structurally normal, with no evidence of dilitation. Venous: The inferior vena cava is dilated in size with less than 50% respiratory variability, suggesting right atrial pressure of 15 mmHg. IAS/Shunts: No atrial level shunt detected by color flow Doppler. Additional Comments: There is a large pleural effusion.  LEFT VENTRICLE PLAX 2D LVIDd:         4.90 cm LVIDs:         3.90 cm LV PW:         1.30 cm LV IVS:        0.90 cm LVOT diam:     2.30 cm LV SV:         57 LV SV Index:   28 LVOT Area:     4.15 cm  RIGHT VENTRICLE RV S prime:     9.79 cm/s TAPSE (M-mode): 1.4 cm LEFT ATRIUM              Index        RIGHT ATRIUM           Index LA Vol (A2C):   113.0 ml 56.03 ml/m  RA Area:     22.90 cm LA Vol (A4C):   146.0 ml 72.39 ml/m  RA Volume:   68.30 ml  33.86 ml/m LA Biplane Vol: 138.0 ml 68.42 ml/m  AORTIC VALVE LVOT Vmax:   75.50 cm/s LVOT Vmean:  54.700 cm/s LVOT VTI:    0.138 m  AORTA Ao Root diam: 3.40 cm Ao Asc diam:  3.50 cm  TRICUSPID VALVE TR Peak grad:   21.5 mmHg TR Vmax:        232.00 cm/s  SHUNTS Systemic VTI:  0.14 m Systemic Diam: 2.30 cm Dorris Carnes MD Electronically signed by Dorris Carnes MD Signature Date/Time: 08/19/2022/6:14:36 PM    Final     Cardiac Studies     Patient Profile     BRITTNAE ASCHENBRENNER is a 81 y.o. 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 Louanne Belton     Assessment & Plan    1.Paroxsyam afib/aflutter - followed at [facility] cardiology - issues with high rates this admission in setting of ongoing hypoxia, bronchopneumonia, HF - at home on coreg 12.'5mg'$  bid at home - started on dilt gtt here,  coreg '25mg'$  bid here initially. BP's  -echo showed drop in LVEF, dilt stopped and started on IV amio. Remains on coreg '25mg'$  bid - tele shows afib low 100s, rebolus amio '150mg'$  and continue drip  -she is on xarelto '15mg'$  daily for stroke prevention. Estimated Creatinine Clearance: 47.3 mL/min (A) (by C-G formula based on SCr of 1.1 mg/dL (H)).           2. Combined systolic/diastolic HF - 01/1659 echo [facility]: LVEF 50-55% - 07/2022 echo: LVEF 63-01%, indet diasotlic fxn, normal RV, severe LAE   CXR mildly increased bilatearl interstitial densities, edema vs pneumonai BNP 469    -medical therapy with coreg '25mg'$  bid. Started losartan 12.'5mg'$  daily yesterday, transition to entresto 24/'26mg'$  bid. Likely MRA and SGLT2i over next day or two.  - I/Os incomplete this AM, weights appear inaccurate. Received IV lasix '40mg'$  daily x 1 yesterday - labs pending this AM, lasix dosing pending renal function   - new drop in LVEF, perhaps stress induced CM in setting of systemic illness or tachy mediated. She does have history of CAD as well. - would favor  medical therapy with repeat echo at this time as opposed to repeat ischemic evaluation. Respiratory status would also not be stable for cath at this time.       3.Pericardial effusion - large effusion by echo, no evidence of tamponade - repeat limited study today - perhaps inflammatory related to pneumonia, inflammatory markers pending   4. CAD - She had LCx stenting in 2005. In December 2014, she had a non-STEMI and had a bare-metal stenting to the LAD. In April 2015, she had a non-STEMI with early restenosis of her LAD stent. This was treated with a drug-eluting stent to the proximaland mid LAD -has been on ASA and xarelto as outpatient, defer to her primary cardiologist  For questions or updates, please contact [place] Please consult [email or web address] for contact info under        Signed, Carlyle Dolly, MD  08/21/2022, 7:14 AM

## 2022-08-21 NOTE — Progress Notes (Signed)
PROGRESS NOTE    Debra Ryan  ZYY:[id number] DOB: [birth year] DOA: 08/14/2022 PCP: Thomes Dinning, MD    Brief Narrative:  Debra Ryan is a 81 y.o. 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  Of note, patient was recently admitted to [facility] 9/22-9/28 for pneumonia decompensated CHF, hypoglycemia, and UTI.  She was discharged to SNF and returned home 2 days ago.  After returning home, patient continued to have shortness of breath, wheezing, nonproductive cough.    In the ED, patient was afebrile but was hypoxic with pulse ox of 89% on room air and was placed on 2 L of nasal cannula oxygen.WBC was 4.3.  BUN 9, creatinine 1.03.  BNP 470.5.  High sensitive troponin 7.  Lactic acid 1.2.  COVID-19 PCR negative.  Chest x-ray with bronchial wall thickening with mild patchy opacities in the lung bases suggestive of bronchitis and possible bronchopneumonia, bilateral interstitial thickening consistent with mild component of congestive heart failure.  Patient was given bronchodilator treatments,vancomycin and cefepime and was considered for admission to hospital for further evaluation and treatment.  During hospitalization, patient had acute respiratory distress and was transferred to stepdown unit initially on BiPAP which was subsequently changed to 5 L of nasal cannula oxygen.  Patient also had atrial fibrillation with rapid ventricular response and cardiology was consulted.  Patient was then started on  amiodarone drip.  Cardiology following.  Assessment & Plan:  Principal Problem:   Bronchopneumonia Active Problems:   Acute respiratory failure with hypoxia (HCC)   Acute on chronic diastolic CHF (congestive heart failure) (HCC)   Normocytic anemia   Type 1 diabetes (HCC)   Chronic pain   OSA (obstructive sleep apnea)   CKD (chronic kidney disease) stage 3, GFR 30-59 ml/min (HCC)   Paroxysmal atrial fibrillation (HCC)   CAD (coronary artery disease)   HTN (hypertension)   HLD (hyperlipidemia)   Hypothyroidism   Bronchopneumonia Chest x-ray showed signs suggestive of bronchitis/ bronchopneumonia.  No evidence of sepsis.  Does not have fever or leukocytosis.   SARS-CoV-2 PCR negative.  Procalcitonin less than 0.10.  MRSA PCR negative, patient was initially on IV vancomycin and cefepime.  Vancomycin has been discontinued.  Blood cultures negative in 5 days.  Was on BiPAP which has been transitioned to nasal cannula oxygen at 5 L/min.  Thinly to wean oxygen as able.  Continue bronchodilators, diuretics and IV steroids.  Will decrease steroids to once daily from tomorrow.  Feels better with breathing today.   Acute hypoxemic respiratory failure Secondary to CHF and bronchopneumonia.  Initially required BiPAP.  Currently requiring 5 L of oxygen by nasal cannula.  Continue to wean as able.Chest x-ray 08/18/2022 showed increased interstitial densities.  Continue Solu-Medrol IV will decrease to once daily starting 08/22/2022.  At baseline, patient uses 2 L of oxygen with CPAP machine.  Currently on 5 L of oxygen by nasal cannula.  Paroxysmal atrial fibrillation with RVR.    On Coreg 12.5 twice daily at home.  This has been changed Coreg to 25 twice daily.  2D echocardiogram revealed low ejection fraction compared to  previous echocardiogram so cardiology was consulted and at this time patient has been taken off Cardizem drip and started on amiodarone drip.  Patient will be rebolused with amiodarone  today.  Follow cardiology recommendations.  Acute on chronic diastolic CHF, new reduced LV ejection fraction Last 2D Echo done at [facility] on 07/23/2022 showing EF 50 to 55%.  Repeat 2D echocardiogram on 08/19/2022 showed LV ejection fraction of 35 to 40%. Continue strict intake and output charting, Daily weights.  Received IV Lasix during hospitalization.  Cardiology on board and patient has been started on Entresto.  Continue Coreg aspirin Lipitor.  Patient is negative balance for 4282 ml  Mild acute kidney injury on CKD stage stage IIIa.   Creatinine of 1.2 today from initial 1.7.  Normocytic anemia Latest hemoglobin of 11.2.  Continue to monitor.  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  Follows up with oncology as outpatient.  DNR.  On supplemental oxygen at this time.  Continue IV steroids.    Type 1 diabetes with hyperglycemia Patient does have history of insulin pump and hypoglycemia.  Currently dose has been adjusted to Lantus 17 units in the morning and 10 units in the evening with prandial insulin 7 units 3 times daily and sliding scale insulin due to being on steroids.  Might need to come down on the insulin since the steroid will be decreased by tomorrow.  Will resume insulin pump on discharge.  Latest Hemoglobin A1c 8.8.    Hypomagnesemia.  Improved after replacement.  Latest magnesium of 2.1.Marland Kitchen   Chronic pain Continue home OxyContin   OSA Continue nightly CPAP   CAD with stents:  Continue aspirin and statins.   Hypertension:  Continue Coreg Lasix.  Delene Loll has been initiated by Cardiology starting today.   Hyperlipidemia Continue Lipitor   Hypothyroidism Continue Synthroid   GERD: Continue Protonix.   Morbid obesity Body mass index is 45.48 kg/m.  Would benefit from weight loss as outpatient.  Debility, deconditioning.  Physical therapy had recommended home health PT.  Will likely need physical therapy reevaluation  tomorrow.     DVT prophylaxis:  Rivaroxaban (XARELTO) tablet 15 mg   Code Status:     Code Status: DNR  Disposition: Uncertain at this time, initial plan was Home with home health.  Status is: Inpatient  Remains inpatient appropriate because:   hypoxic respiratory failure, amiodarone drip,     Family Communication:   Spoke with the patient's husband 08/19/2022 and updated him about the clinical condition of the patient.  Consultants:  Cardiology  Procedures:  BiPAP placement  Antimicrobials:  Cefepime IV   Subjective:  Today, patient was seen and examined at bedside.  Feels little better with breathing denies any pain, nausea, vomiting, fever, chills or rigor.  Denies any chest pain.   Objective: Vitals:   08/21/22 0817 08/21/22 0900 08/21/22 0921 08/21/22 1000  BP: (!) 142/100 139/78  130/84  Pulse: (!) 113 88 88 82  Resp:  15 (!) 21 15  Temp:   97.8 F (36.6 C)   TempSrc:   Oral   SpO2:  (!) 84% 100% 98%  Weight:      Height:        Intake/Output Summary (Last 24 hours) at 08/21/2022 1016 Last data filed at 08/21/2022 1000 Gross per 24 hour  Intake 660.59 ml  Output 1050 ml  Net -389.41 ml    Filed Weights   08/19/22 0553 08/20/22 0330 08/21/22 0500  Weight: 102.9  kg 103 kg 105.6 kg    Physical Examination: Body mass index is 42.58 kg/m.   General: Obese built, not in obvious distress, weak and deconditioned, on nasal cannula oxygen HENT:   No scleral pallor or icterus noted. Oral mucosa is moist.  Chest:    Diminished breath sounds bilaterally.  Coarse breath sounds noted. CVS: S1 &S2 heard. No murmur.  Regular rate and rhythm. Abdomen: Soft, nontender, nondistended.  Bowel sounds are heard.   Extremities: No cyanosis, clubbing with peripheral edema.  Peripheral pulses are palpable. Psych: Alert, awake and oriented, normal mood CNS:  No cranial nerve deficits.  Power equal in all extremities.  Generalized weakness noted. Skin: Warm and dry.  No  rashes noted.  Data Reviewed:   CBC: Recent Labs  Lab 08/14/22 1643 08/15/22 0452 08/17/22 0529 08/18/22 0528 08/19/22 0854 08/20/22 0322 08/21/22 0323  WBC 4.3   < > 5.6 5.4 6.3 11.5* 13.4*  NEUTROABS 3.1  --   --   --   --   --   --   HGB 10.7*   < > 9.5* 10.4* 12.0 11.3* 11.2*  HCT 33.1*   < > 29.9* 32.9* 37.9 34.6* 34.8*  MCV 95.4   < > 98.0 96.5 97.4 94.3 93.8  PLT 227   < > 172 177 189 181 189   < > = values in this interval not displayed.     Basic Metabolic Panel: Recent Labs  Lab 08/17/22 0529 08/18/22 0528 08/19/22 0854 08/20/22 0322 08/21/22 0323  NA 137 139 133* 135 135  K 4.0 3.5 4.0 3.7 4.2  CL 107 105 98 99 99  CO2 '26 27 26 28 25  '$ GLUCOSE 236* 207* 343* 266* 313*  BUN '17 15 15 16 '$ 27*  CREATININE 1.33* 1.07* 1.14* 1.10* 1.26*  CALCIUM 8.5* 8.6* 8.6* 8.7* 8.7*  MG 1.8 1.8 1.6* 2.0 2.1     Liver Function Tests: Recent Labs  Lab 08/15/22 0452  AST 24  ALT 16  ALKPHOS 106  BILITOT 1.1  PROT 5.9*  ALBUMIN 3.2*      Radiology Studies: ECHOCARDIOGRAM COMPLETE  Result Date: 08/19/2022    ECHOCARDIOGRAM REPORT   Patient Name:   Mercy Moore Date of Exam: 08/19/2022 Medical Rec #:  [id number]           Height:       62.0 in Accession #:    [id number]          Weight:       226.9 lb Date of Birth:  1941/01/20           BSA:          2.017 m Patient Age:    45 years            BP:           150/47 mmHg Patient Gender: F                   HR:           80 bpm. Exam Location:  Inpatient Procedure: 2D Echo, Cardiac Doppler and Color Doppler Indications:    CHF  History:        Patient has no prior history of Echocardiogram examinations.                 CHF, CAD; Risk Factors:Diabetes, Hypertension and Dyslipidemia.  Sonographer:    Danne Baxter RDCS, FE, PE Referring Phys: [phone] [place]  1. Hypokinesis of the mid portion of the LV; akinesis of the distal anterior, distal inferior and apical walls. . Left ventricular ejection  fraction, by estimation, is 35 to 40%. The left ventricle has moderately decreased function. There is mild left ventricular hypertrophy. Left ventricular diastolic parameters are indeterminate.  2. Right ventricular systolic function is normal. The right ventricular size is mildly enlarged.  3. Left atrial size was severely dilated.  4. Right atrial size was mildly dilated.  5. Large pericardial effusion. The pericardial effusion is circumferential. Large pleural effusion.  6. Mild to moderate mitral valve regurgitation.  7. The aortic valve is tricuspid. Aortic valve regurgitation is not visualized. Aortic valve sclerosis/calcification is present, without any evidence of aortic stenosis.  8. The inferior vena cava is dilated in size with <50% respiratory variability, suggesting right atrial pressure of 15 mmHg. FINDINGS  Left Ventricle: Hypokinesis of the mid portion of the LV; akinesis of the distal anterior, distal inferior and apical walls. Left ventricular ejection fraction, by estimation, is 35 to 40%. The left ventricle has moderately decreased function. The left ventricular internal cavity size was normal in size. There is mild left ventricular hypertrophy. Left ventricular diastolic parameters are indeterminate. Right Ventricle: The right ventricular size is mildly enlarged. Right vetricular wall thickness was not assessed. Right ventricular systolic function is normal. Left [facility]: Left atrial size was severely dilated. Right [facility]: Right atrial size was mildly dilated. Pericardium: A large pericardial effusion is present. The pericardial effusion is circumferential. Mitral Valve: There is mild thickening of the mitral valve leaflet(s). Mild to moderate mitral valve regurgitation. Tricuspid Valve: The tricuspid valve is normal in structure. Tricuspid valve regurgitation is mild. Aortic Valve: The aortic valve is tricuspid. Aortic valve regurgitation is not visualized. Aortic valve sclerosis/calcification  is present, without any evidence of aortic stenosis. Pulmonic Valve: The pulmonic valve was normal in structure. Pulmonic valve regurgitation is not visualized. Aorta: The aortic root and ascending aorta are structurally normal, with no evidence of dilitation. Venous: The inferior vena cava is dilated in size with less than 50% respiratory variability, suggesting right atrial pressure of 15 mmHg. IAS/Shunts: No atrial level shunt detected by color flow Doppler. Additional Comments: There is a large pleural effusion.  LEFT VENTRICLE PLAX 2D LVIDd:         4.90 cm LVIDs:         3.90 cm LV PW:         1.30 cm LV IVS:        0.90 cm LVOT diam:     2.30 cm LV SV:         57 LV SV Index:   28 LVOT Area:     4.15 cm  RIGHT VENTRICLE RV S prime:     9.79 cm/s TAPSE (M-mode): 1.4 cm LEFT ATRIUM              Index        RIGHT ATRIUM           Index LA Vol (A2C):   113.0 ml 56.03 ml/m  RA Area:     22.90 cm LA Vol (A4C):   146.0 ml 72.39 ml/m  RA Volume:   68.30 ml  33.86 ml/m LA Biplane Vol: 138.0 ml 68.42 ml/m  AORTIC VALVE LVOT Vmax:   75.50 cm/s LVOT Vmean:  54.700 cm/s LVOT VTI:    0.138 m  AORTA Ao Root diam: 3.40 cm Ao Asc diam:  3.50 cm TRICUSPID VALVE TR  Peak grad:   21.5 mmHg TR Vmax:        232.00 cm/s  SHUNTS Systemic VTI:  0.14 m Systemic Diam: 2.30 cm Dorris Carnes MD Electronically signed by Dorris Carnes MD Signature Date/Time: 08/19/2022/6:14:36 PM    Final       LOS: 7 days    Flora Lipps, MD [facility] Available via Epic secure chat 7am-7pm After these hours, please refer to coverage provider listed on [email or web address] 08/21/2022, 10:16 AM

## 2022-08-22 DIAGNOSIS — I4891 Unspecified atrial fibrillation: Secondary | ICD-10-CM | POA: Diagnosis not present

## 2022-08-22 DIAGNOSIS — J180 Bronchopneumonia, unspecified organism: Secondary | ICD-10-CM | POA: Diagnosis not present

## 2022-08-22 LAB — CBC
HCT: 40.1 % (ref 36.0–46.0)
Hemoglobin: 12.8 g/dL (ref 12.0–15.0)
MCH: 30.6 pg (ref 26.0–34.0)
MCHC: 31.9 g/dL (ref 30.0–36.0)
MCV: 95.9 fL (ref 80.0–100.0)
Platelets: 168 10*3/uL (ref 150–400)
RBC: 4.18 MIL/uL (ref 3.87–5.11)
RDW: 14.1 % (ref 11.5–15.5)
WBC: 13 10*3/uL — ABNORMAL HIGH (ref 4.0–10.5)
nRBC: 0 % (ref 0.0–0.2)

## 2022-08-22 LAB — BASIC METABOLIC PANEL
Anion gap: 10 (ref 5–15)
BUN: 27 mg/dL — ABNORMAL HIGH (ref 8–23)
CO2: 23 mmol/L (ref 22–32)
Calcium: 8.3 mg/dL — ABNORMAL LOW (ref 8.9–10.3)
Chloride: 100 mmol/L (ref 98–111)
Creatinine, Ser: 1.11 mg/dL — ABNORMAL HIGH (ref 0.44–1.00)
GFR, Estimated: 51 mL/min — ABNORMAL LOW (ref >60)
Glucose, Bld: 250 mg/dL — ABNORMAL HIGH (ref 70–99)
Potassium: 3.5 mmol/L (ref 3.5–5.1)
Sodium: 133 mmol/L — ABNORMAL LOW (ref 135–145)

## 2022-08-22 LAB — GLUCOSE, CAPILLARY
Glucose-Capillary: 265 mg/dL — ABNORMAL HIGH (ref 70–99)
Glucose-Capillary: 240 mg/dL — ABNORMAL HIGH (ref 70–99)
Glucose-Capillary: 221 mg/dL — ABNORMAL HIGH (ref 70–99)
Glucose-Capillary: 173 mg/dL — ABNORMAL HIGH (ref 70–99)
Glucose-Capillary: 142 mg/dL — ABNORMAL HIGH (ref 70–99)

## 2022-08-22 LAB — MAGNESIUM: Magnesium: 2.2 mg/dL (ref 1.7–2.4)

## 2022-08-22 MED ORDER — FUROSEMIDE 10 MG/ML IJ SOLN
40.0000 mg | Freq: Every day | INTRAMUSCULAR | Status: DC
  Administered 2022-08-22: 40 mg via INTRAVENOUS
  Filled 2022-08-22 (×2): qty 4

## 2022-08-22 MED ORDER — FUROSEMIDE 10 MG/ML IJ SOLN: 40.0000 mg | Freq: Once | INTRAMUSCULAR | Status: DC

## 2022-08-22 MED ORDER — EMPAGLIFLOZIN 10 MG PO TABS
10.0000 mg | ORAL_TABLET | Freq: Every day | ORAL | Status: DC
  Administered 2022-08-22 – 2022-08-25 (×4): 10 mg via ORAL
  Filled 2022-08-22 (×4): qty 1

## 2022-08-22 MED ORDER — SPIRONOLACTONE 25 MG PO TABS
25.0000 mg | ORAL_TABLET | Freq: Every day | ORAL | Status: DC
  Administered 2022-08-22 – 2022-08-25 (×4): 25 mg via ORAL
  Filled 2022-08-22 (×4): qty 1

## 2022-08-22 NOTE — Progress Notes (Signed)
Rounding Note    Patient Name: Debra Ryan Date of Encounter: 08/22/2022  [place] Cardiologist: [facility]  Subjective   Breathing stable. No chest pain. HR 90-100s. LE edema   Inpatient Medications    Scheduled Meds:  aspirin  81 mg Oral Daily   atorvastatin  40 mg Oral Daily   carvedilol  25 mg Oral BID WC   Chlorhexidine Gluconate Cloth  6 each Topical Daily   guaiFENesin  600 mg Oral BID   insulin aspart  0-5 Units Subcutaneous QHS   insulin aspart  0-9 Units Subcutaneous TID WC   insulin aspart  7 Units Subcutaneous TID WC   insulin glargine-yfgn  10 Units Subcutaneous QHS   insulin glargine-yfgn  17 Units Subcutaneous q morning   levothyroxine  200 mcg Oral QAC breakfast   magnesium oxide  400 mg Oral BID   methylPREDNISolone (SOLU-MEDROL) injection  40 mg Intravenous Daily   oxyCODONE  15 mg Oral Q12H   pantoprazole  80 mg Oral Daily   Rivaroxaban  15 mg Oral Q supper   sacubitril-valsartan  1 tablet Oral BID   Continuous Infusions:  sodium chloride Stopped (08/20/22 1514)   sodium chloride 10 mL/hr at 08/20/22 0319   amiodarone 30 mg/hr (08/22/22 0944)   PRN Meds: sodium chloride, sodium chloride, albuterol, guaiFENesin-dextromethorphan, ondansetron (ZOFRAN) IV, oxyCODONE, prochlorperazine   Vital Signs    Vitals:   08/22/22 0700 08/22/22 0723 08/22/22 0800 08/22/22 0831  BP: 139/69  138/75 (!) 136/90  Pulse: 78 94 95 95  Resp: '10 14 15 19  '$ Temp:   97.9 F (36.6 C)   TempSrc:   Axillary   SpO2: 93% 92% 96% 90%  Weight:      Height:        Intake/Output Summary (Last 24 hours) at 08/22/2022 1010 Last data filed at 08/22/2022 0800 Gross per 24 hour  Intake 365.79 ml  Output 250 ml  Net 115.79 ml      08/22/2022    5:00 AM 08/21/2022    5:00 AM 08/20/2022    3:30 AM  Last 3 Weights  Weight (lbs) 235 lb 14.3 oz 232 lb 12.9 oz 227 lb 1.2 oz  Weight (kg) 107 kg 105.6 kg 103 kg      Telemetry    Afib 90-100s  - Personally Reviewed  ECG   N/A  Physical Exam   GEN: No acute distress.   Neck: No JVD Cardiac: IR IR, no murmurs, rubs, or gallops.  Respiratory: Clear to auscultation bilaterally. GI: Soft, nontender, non-distended  YF:VCBSW to 1 + edema; No deformity. Neuro:  Nonfocal  Psych: Normal affect   Labs    High Sensitivity Troponin:   Recent Labs  Lab 08/14/22 1643 08/14/22 1932  TROPONINIHS 7 7     Chemistry Recent Labs  Lab 08/20/22 0322 08/21/22 0323 08/22/22 0728  NA 135 135 133*  K 3.7 4.2 3.5  CL 99 99 100  CO2 '28 25 23  '$ GLUCOSE 266* 313* 250*  BUN 16 27* 27*  CREATININE 1.10* 1.26* 1.11*  CALCIUM 8.7* 8.7* 8.3*  MG 2.0 2.1 2.2  GFRNONAA 51* 43* 51*  ANIONGAP '8 11 10    '$ Hematology Recent Labs  Lab 08/20/22 0322 08/21/22 0323 08/22/22 0728  WBC 11.5* 13.4* 13.0*  RBC 3.67* 3.71* 4.18  HGB 11.3* 11.2* 12.8  HCT 34.6* 34.8* 40.1  MCV 94.3 93.8 95.9  MCH 30.8 30.2 30.6  MCHC 32.7 32.2 31.9  RDW 13.9 14.1 14.1  PLT 181 189 168   Thyroid  Recent Labs  Lab 08/21/22 0323  TSH 0.956    BNP Recent Labs  Lab 08/16/22 0533 08/21/22 0323  BNP 469.4* 461.9*     Radiology    ECHOCARDIOGRAM LIMITED  Result Date: 08/21/2022    ECHOCARDIOGRAM LIMITED REPORT   Patient Name:   Debra Ryan Date of Exam: 08/21/2022 Medical Rec #:  [id number]           Height:       62.0 in Accession #:    [id number]          Weight:       232.8 lb Date of Birth:  Jul 20, 1941           BSA:          2.039 m Patient Age:    81 years            BP:           130/84 mmHg Patient Gender: F                   HR:           85 bpm. Exam Location:  Inpatient Procedure: Limited Echo, Cardiac Doppler and Color Doppler Indications:    Pericardial effusion  History:        Patient has prior history of Echocardiogram examinations, most                 recent 08/22/2022.  Sonographer:    Harvie Junior Referring Phys: [id number] Alphonse Guild BRANCH  Sonographer Comments: Technically  difficult study due to poor echo windows. Image acquisition challenging due to patient body habitus. IMPRESSIONS  1. Left ventricular ejection fraction, by estimation, is 35%. Left ventricular endocardial border not optimally defined to evaluate regional wall motion.  2. Moderate pericardial effusion primarily surrounding the LV. There is no evidence of tamponade physiology. Stable from 08/19/22 study.  3. The inferior vena cava is normal in size with greater than 50% respiratory variability, suggesting right atrial pressure of 3 mmHg.  4. Limited echo to evaluate pericardial effusion. FINDINGS  Left Ventricle: Left ventricular ejection fraction, by estimation, is 35%. Left ventricular endocardial border not optimally defined to evaluate regional wall motion. Pericardium: Moderate pericardial effusion primarily surrounding the LV. There is no evidence of tamponade physiology. A moderately sized pericardial effusion is present. There is no evidence of cardiac tamponade. Venous: The inferior vena cava is normal in size with greater than 50% respiratory variability, suggesting right atrial pressure of 3 mmHg. Additional Comments: There is a moderate pleural effusion in the left lateral region. LEFT VENTRICLE PLAX 2D LVIDd:         5.10 cm      Diastology LVIDs:         4.20 cm      LV e' medial:    7.72 cm/s LV PW:         1.00 cm      LV E/e' medial:  14.2 LV IVS:        1.00 cm      LV e' lateral:   8.38 cm/s LVOT diam:     2.00 cm      LV E/e' lateral: 13.1 LVOT Area:     3.14 cm  LV Volumes (MOD) LV vol d, MOD A2C: 171.0 ml LV vol d, MOD A4C: 107.0 ml LV vol s, MOD A2C: 115.0 ml LV  vol s, MOD A4C: 66.2 ml LV SV MOD A2C:     56.0 ml LV SV MOD A4C:     107.0 ml LV SV MOD BP:      53.9 ml RIGHT VENTRICLE RV S prime:     10.40 cm/s MITRAL VALVE MV Area (PHT): 4.94 cm       SHUNTS MV Decel Time: 154 msec       Systemic Diam: 2.00 cm MR Peak grad:    114.3 mmHg MR Vmax:         534.67 cm/s MR PISA:         3.08 cm MR  PISA Eff ROA: 16 mm MR PISA Radius:  0.70 cm MV E velocity: 110.00 cm/s MV A velocity: 37.50 cm/s MV E/A ratio:  2.93 Carlyle Dolly MD Electronically signed by Carlyle Dolly MD Signature Date/Time: 08/21/2022/12:56:56 PM    Final     Cardiac Studies   Limited echo 08/21/2022  1. Left ventricular ejection fraction, by estimation, is 35%. Left  ventricular endocardial border not optimally defined to evaluate regional  wall motion.   2. Moderate pericardial effusion primarily surrounding the LV. There is  no evidence of tamponade physiology. Stable from 08/19/22 study.   3. The inferior vena cava is normal in size with greater than 50%  respiratory variability, suggesting right atrial pressure of 3 mmHg.   4. Limited echo to evaluate pericardial effusion.   Echo 08/19/2022 1. Hypokinesis of the mid portion of the LV; akinesis of the distal  anterior, distal inferior and apical walls. . Left ventricular ejection  fraction, by estimation, is 35 to 40%. The left ventricle has moderately  decreased function. There is mild left  ventricular hypertrophy. Left ventricular diastolic parameters are  indeterminate.   2. Right ventricular systolic function is normal. The right ventricular  size is mildly enlarged.   3. Left atrial size was severely dilated.   4. Right atrial size was mildly dilated.   5. Large pericardial effusion. The pericardial effusion is  circumferential. Large pleural effusion.   6. Mild to moderate mitral valve regurgitation.   7. The aortic valve is tricuspid. Aortic valve regurgitation is not  visualized. Aortic valve sclerosis/calcification is present, without any  evidence of aortic stenosis.   8. The inferior vena cava is dilated in size with <50% respiratory  variability, suggesting right atrial pressure of 15 mmHg.   Patient Profile     81 y.o. 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 Louanne Belton.   Assessment & Plan    Atrial fibrillation/flutter with rapid ventricular rate -Elevated heart rate in setting of ongoing hypoxia, bronchopneumonia and heart failure -Started with IV diltiazem but discontinue due to drop in EF.  Treated with IV amiodarone.  Given rebolused yesterday -Heart rate currently stable in 90-100>> will discuss transition to p.o. -Continue carvedilol 25 mg twice daily -Continue Xarelto 15 mg for anticoagulation based on creatinine clearance  2.  Acute on chronic combined CHF -Echo this admission showed reduced LV function at 35 to 40% from 50 to 55% last month -Felt likely rate related but does have history of CAD.  Recommended medical therapy and repeat echocardiogram in few month.  Follow-up with regular cardiology -Continue carvedilol 25  mg twice daily -Continue Entresto 24/26 mg twice daily -Net INO -4.1 L. Weight down 245>>235lb - Will give IV lasix '40mg'$  qd today  - Add Jardiance   3.  Pericardial effusion -Repeat echo yesterday showed stable moderate pericardial effusion without tamponade future  4. CAD with history of multiple stenting -On ASA and Xarelto PTA - Continue BB and statin   For questions or updates, please contact [place] Please consult [email or web address] for contact info under        SignedLeanor Kail, PA  08/22/2022, 10:10 AM

## 2022-08-22 NOTE — Progress Notes (Signed)
PROGRESS NOTE    EIMY PLAZA  GYJ:[id number] DOB: 1941-10-28 DOA: 08/14/2022 PCP: No primary care provider on file.    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  Patient was recently admitted to the hospital for pneumonia and decompensated CHF, hypoglycemia and UTI.  She had gone home from a SNF 2 days ago only.  In the emergency room afebrile, hypoxemic 89% on room air, 2 L for compensation that is her baseline.  COVID-19 negative.  Chest x-ray with bronchial wall thickening with mild patchy opacities lung bases, bilateral interstitial thickening.  During hospitalization she had developed respiratory distress needing BiPAP, also developed rapid A-fib and currently on amiodarone drip.  Followed by cardiology.   Assessment & Plan:   Bronchopneumonia: Chest x-ray consistent with bronchitis/bronchopneumonia.  No evidence of sepsis.  WBC count normal. COVID 19 influenza negative.  Procalcitonin less than 0.1.  Initially on IV vancomycin and cefepime and now symptomatically treated.  Completed antibiotics.  Blood cultures negative.  Oxygenation improving. Continue bronchodilators, diuretics and steroids, will gradually taper off.  Acute hypoxemic respiratory failure with respiratory distress: Needing BiPAP.  Developed respiratory failure needing 5 L of oxygen by nasal cannula.  Currently improved and on 2 L oxygen that she uses at home.  She is using CPAP at night.  Paroxysmal A-fib with RVR: Increasing dose of Coreg.  2D echocardiogram with low ejection fraction.  Cardizem was discontinued.   Patient currently remains on amiodarone drip and managed by cardiology, bolused amiodarone on 10/22.  Acute on chronic diastolic heart failure: New EF with ejection fraction 35 to 40%.  Currently on IV Lasix, Entresto, Coreg, aspirin and Lipitor.  CKD stage IIIa: Creatinine remains at about baseline.  Monitor closely.  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  IV steroids.  Type 1 diabetes with hyperglycemia: Patient on insulin pump at home.  Currently remains on subcu insulin while hospitalized.  Will resume insulin pump on discharge.  Chronic pain syndrome: Managed with oxycodone.  Obstructive sleep apnea: Wearing CPAP at night.  Physical deconditioning: Work with PT OT today.   DVT prophylaxis: Place and maintain sequential compression device Start: 08/22/22 1014 Rivaroxaban (XARELTO) tablet 15 mg   Code Status: DNR Family Communication: None at the bedside Disposition Plan: Status is: Inpatient Remains inpatient appropriate because: Amiodarone infusion     Consultants:  Cardiology  Procedures:  None  Antimicrobials:  Vancomycin and cefepime 10/19---   Subjective: Patient was seen and examined.  Patient tells me that today she feels better.  Denies any nausea vomiting chest pain or fluttering.  Telemetry monitor with A-fib with ventricular rate is controlled and now 100-110.  Patient has not mobilized yet.  Objective: Vitals:   08/22/22 0700 08/22/22 0723 08/22/22 0800 08/22/22 0831  BP: 139/69  138/75 (!) 136/90  Pulse: 78 94 95 95  Resp: '10 14 15 19  '$ Temp:   97.9 F (36.6 C)   TempSrc:   Axillary   SpO2: 93% 92% 96% 90%  Weight:      Height:        Intake/Output Summary (Last 24 hours) at 08/22/2022 1230 Last data filed at 08/22/2022 0800  Gross per 24 hour  Intake 332.41 ml  Output 250 ml  Net 82.41 ml   Filed Weights   08/20/22 0330 08/21/22 0500 08/22/22 0500  Weight: 103 kg 105.6 kg 107 kg     Examination:  General exam: Appears calm and comfortable , Frail and debilitated.  Not in any distress.  Currently on 2 L oxygen. Respiratory system: Poor bilateral air entry.  No added sounds. Cardiovascular system: S1 & S2 heard, irregularly irregular.  No JVD, murmurs, rubs, gallops or clicks. No pedal edema. Gastrointestinal system: Abdomen is nondistended, soft and nontender. No organomegaly or masses felt. Normal bowel sounds heard. Central nervous system: Alert and oriented. No focal neurological deficits.  Profoundly debilitated. Extremities: Symmetric 5 x 5 power.  Data Reviewed: I have personally reviewed following labs and imaging studies  CBC: Recent Labs  Lab 08/18/22 0528 08/19/22 0854 08/20/22 0322 08/21/22 0323 08/22/22 0728  WBC 5.4 6.3 11.5* 13.4* 13.0*  HGB 10.4* 12.0 11.3* 11.2* 12.8  HCT 32.9* 37.9 34.6* 34.8* 40.1  MCV 96.5 97.4 94.3 93.8 95.9  PLT 177 189 181 189 024   Basic Metabolic Panel: Recent Labs  Lab 08/18/22 0528 08/19/22 0854 08/20/22 0322 08/21/22 0323 08/22/22 0728  NA 139 133* 135 135 133*  K 3.5 4.0 3.7 4.2 3.5  CL 105 98 99 99 100  CO2 '27 26 28 25 23  '$ GLUCOSE 207* 343* 266* 313* 250*  BUN '15 15 16 '$ 27* 27*  CREATININE 1.07* 1.14* 1.10* 1.26* 1.11*  CALCIUM 8.6* 8.6* 8.7* 8.7* 8.3*  MG 1.8 1.6* 2.0 2.1 2.2   GFR: Estimated Creatinine Clearance: 47.3 mL/min (A) (by C-G formula based on SCr of 1.11 mg/dL (H)). Liver Function Tests: No results for input(s): "AST", "ALT", "ALKPHOS", "BILITOT", "PROT", "ALBUMIN" in the last 168 hours. No results for input(s): "LIPASE", "AMYLASE" in the last 168 hours. No results for input(s): "AMMONIA" in the last 168 hours. Coagulation Profile: No results for input(s): "INR", "PROTIME" in the last 168 hours. Cardiac Enzymes: No results for input(s): "CKTOTAL", "CKMB", "CKMBINDEX", "TROPONINI" in the last 168 hours. BNP (last 3 results) No results for input(s): "PROBNP" in the last 8760  hours. HbA1C: No results for input(s): "HGBA1C" in the last 72 hours. CBG: Recent Labs  Lab 08/21/22 1602 08/21/22 2119 08/22/22 0408 08/22/22 0736 08/22/22 1205  GLUCAP 263* 265* 265* 240* 221*   Lipid Profile: No results for input(s): "CHOL", "HDL", "LDLCALC", "TRIG", "CHOLHDL", "LDLDIRECT" in the last 72 hours. Thyroid Function Tests: Recent Labs    08/21/22 0323  TSH 0.956   Anemia Panel: No results for input(s): "VITAMINB12", "FOLATE", "FERRITIN", "TIBC", "IRON", "RETICCTPCT" in the last 72 hours. Sepsis Labs: No results for input(s): "PROCALCITON", "LATICACIDVEN" in the last 168 hours.  Recent Results (from the past 240 hour(s))  Blood culture (routine x 2)     Status: None   Collection Time: 08/14/22  4:44 PM   Specimen: Right Antecubital; Blood  Result Value Ref Range Status   Specimen Description   Final    RIGHT ANTECUBITAL BLOOD Performed at [facility], [address] [place], [place] [address]    Special Requests   Final    Blood Culture adequate volume BOTTLES DRAWN AEROBIC AND ANAEROBIC Performed at [facility], [address], [place], [place] [address]    Culture   Final    NO GROWTH 5 DAYS Performed at [facility], [place] [address], [place], [place] [address]  Report Status 08/19/2022 FINAL  Final  SARS Coronavirus 2 by RT PCR (hospital order, performed in [facility] hospital lab) *cepheid single result test* Anterior Nasal Swab     Status: None   Collection Time: 08/14/22  5:25 PM   Specimen: Anterior Nasal Swab  Result Value Ref Range Status   SARS Coronavirus 2 by RT PCR NEGATIVE NEGATIVE Final    Comment: (NOTE) SARS-CoV-2 target nucleic acids are NOT DETECTED.  The SARS-CoV-2 RNA is generally detectable in upper and lower respiratory specimens during the acute phase of infection. The lowest concentration of SARS-CoV-2 viral copies this assay can detect is 250 copies / mL. A negative result does not preclude  SARS-CoV-2 infection and should not be used as the sole basis for treatment or other patient management decisions.  A negative result may occur with improper specimen collection / handling, submission of specimen other than nasopharyngeal swab, presence of viral mutation(s) within the areas targeted by this assay, and inadequate number of viral copies (<250 copies / mL). A negative result must be combined with clinical observations, patient history, and epidemiological information.  Fact Sheet for Patients:   https://www.patel.info/  Fact Sheet for Healthcare Providers: https://hall.com/  This test is not yet approved or  cleared by the [place] FDA and has been authorized for detection and/or diagnosis of SARS-CoV-2 by FDA under an Emergency Use Authorization (EUA).  This EUA will remain in effect (meaning this test can be used) for the duration of the COVID-19 declaration under Section 564(b)(1) of the Act, 21 U.S.C. section 360bbb-3(b)(1), unless the authorization is terminated or revoked sooner.  Performed at [facility], [address] [place], [place] [address]   MRSA Next Gen by PCR, Nasal     Status: None   Collection Time: 08/14/22  5:31 PM   Specimen: Nasal Mucosa; Nasal Swab  Result Value Ref Range Status   MRSA by PCR Next Gen NOT DETECTED NOT DETECTED Final    Comment: (NOTE) The GeneXpert MRSA Assay (FDA approved for NASAL specimens only), is one component of a comprehensive MRSA colonization surveillance program. It is not intended to diagnose MRSA infection nor to guide or monitor treatment for MRSA infections. Test performance is not FDA approved in patients less than 56 years old. Performed at [facility], [place] [address], [place], [place] [address]   Blood culture (routine x 2)     Status: None   Collection Time: 08/14/22  5:43 PM   Specimen: Left Antecubital; Blood  Result  Value Ref Range Status   Specimen Description   Final    LEFT ANTECUBITAL BLOOD Performed at [facility], [address] [place], [place] [address]    Special Requests   Final    Blood Culture adequate volume BOTTLES DRAWN AEROBIC AND ANAEROBIC Performed at [facility], [address] [place], [place] [address]    Culture   Final    NO GROWTH 5 DAYS Performed at [facility], [place] [address] [place], [place] [address]    Report Status 08/19/2022 FINAL  Final         Radiology Studies: ECHOCARDIOGRAM LIMITED  Result Date: 08/21/2022    ECHOCARDIOGRAM LIMITED REPORT   Patient Name:   Mercy Moore Date of Exam: 08/21/2022 Medical Rec #:  [id number]           Height:       62.0 in Accession #:  [id number]          Weight:       232.8 lb Date of Birth:  05-Apr-1941           BSA:          2.039 m Patient Age:    58 years            BP:           130/84 mmHg Patient Gender: F                   HR:           85 bpm. Exam Location:  Inpatient Procedure: Limited Echo, Cardiac Doppler and Color Doppler Indications:    Pericardial effusion  History:        Patient has prior history of Echocardiogram examinations, most                 recent 08/22/2022.  Sonographer:    Harvie Junior Referring Phys: [id number] Alphonse Guild BRANCH  Sonographer Comments: Technically difficult study due to poor echo windows. Image acquisition challenging due to patient body habitus. IMPRESSIONS  1. Left ventricular ejection fraction, by estimation, is 35%. Left ventricular endocardial border not optimally defined to evaluate regional wall motion.  2. Moderate pericardial effusion primarily surrounding the LV. There is no evidence of tamponade physiology. Stable from 08/19/22 study.  3. The inferior vena cava is normal in size with greater than 50% respiratory variability, suggesting right atrial pressure of 3 mmHg.  4. Limited echo to evaluate pericardial effusion. FINDINGS  Left Ventricle:  Left ventricular ejection fraction, by estimation, is 35%. Left ventricular endocardial border not optimally defined to evaluate regional wall motion. Pericardium: Moderate pericardial effusion primarily surrounding the LV. There is no evidence of tamponade physiology. A moderately sized pericardial effusion is present. There is no evidence of cardiac tamponade. Venous: The inferior vena cava is normal in size with greater than 50% respiratory variability, suggesting right atrial pressure of 3 mmHg. Additional Comments: There is a moderate pleural effusion in the left lateral region. LEFT VENTRICLE PLAX 2D LVIDd:         5.10 cm      Diastology LVIDs:         4.20 cm      LV e' medial:    7.72 cm/s LV PW:         1.00 cm      LV E/e' medial:  14.2 LV IVS:        1.00 cm      LV e' lateral:   8.38 cm/s LVOT diam:     2.00 cm      LV E/e' lateral: 13.1 LVOT Area:     3.14 cm  LV Volumes (MOD) LV vol d, MOD A2C: 171.0 ml LV vol d, MOD A4C: 107.0 ml LV vol s, MOD A2C: 115.0 ml LV vol s, MOD A4C: 66.2 ml LV SV MOD A2C:     56.0 ml LV SV MOD A4C:     107.0 ml LV SV MOD BP:      53.9 ml RIGHT VENTRICLE RV S prime:     10.40 cm/s MITRAL VALVE MV Area (PHT): 4.94 cm       SHUNTS MV Decel Time: 154 msec       Systemic Diam: 2.00 cm MR Peak grad:    114.3 mmHg MR Vmax:         534.67 cm/s MR PISA:  3.08 cm MR PISA Eff ROA: 16 mm MR PISA Radius:  0.70 cm MV E velocity: 110.00 cm/s MV A velocity: 37.50 cm/s MV E/A ratio:  2.93 Carlyle Dolly MD Electronically signed by Carlyle Dolly MD Signature Date/Time: 08/21/2022/12:56:56 PM    Final         Scheduled Meds:  aspirin  81 mg Oral Daily   atorvastatin  40 mg Oral Daily   carvedilol  25 mg Oral BID WC   Chlorhexidine Gluconate Cloth  6 each Topical Daily   empagliflozin  10 mg Oral Daily   furosemide  40 mg Intravenous Daily   guaiFENesin  600 mg Oral BID   insulin aspart  0-5 Units Subcutaneous QHS   insulin aspart  0-9 Units Subcutaneous TID WC    insulin aspart  7 Units Subcutaneous TID WC   insulin glargine-yfgn  10 Units Subcutaneous QHS   insulin glargine-yfgn  17 Units Subcutaneous q morning   levothyroxine  200 mcg Oral QAC breakfast   magnesium oxide  400 mg Oral BID   methylPREDNISolone (SOLU-MEDROL) injection  40 mg Intravenous Daily   oxyCODONE  15 mg Oral Q12H   pantoprazole  80 mg Oral Daily   Rivaroxaban  15 mg Oral Q supper   sacubitril-valsartan  1 tablet Oral BID   spironolactone  25 mg Oral Daily   Continuous Infusions:  sodium chloride Stopped (08/20/22 1514)   sodium chloride 10 mL/hr at 08/20/22 0319   amiodarone 30 mg/hr (08/22/22 0944)     LOS: 8 days    Time spent: 35 minutes    Barb Merino, MD [facility] Pager [phone]

## 2022-08-22 NOTE — Plan of Care (Signed)

## 2022-08-22 NOTE — Progress Notes (Signed)
Physical Therapy Treatment Patient Details Name: Debra Ryan MRN: [id number] DOB: 03/29/1941 Today's Date: 08/22/2022   History of Present Illness Debra Ryan is a 81 y.o. female admitted with bronchopneumonia. 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    PT Comments    The patient has been sitting in recliner for several hours, ready to get  back to bed.  Patient min guard/min assist for standing  and stepping to  bed. Patient reports fatigue. Patient on 2 L , SPO2 94%.Continue PT for mobility.  Recommendations for follow up therapy are one component of a multi-disciplinary discharge planning process, led by the attending physician.  Recommendations may be updated based on patient status, additional functional criteria and insurance authorization.  Follow Up Recommendations  Home health PT     Assistance Recommended at Discharge Intermittent Supervision/Assistance  Patient can return home with the following A little help with walking and/or transfers;A little help with bathing/dressing/bathroom;Assistance with cooking/housework;Assist for transportation;Help with stairs or ramp for entrance   Equipment Recommendations  None recommended by PT    Recommendations for Other Services       Precautions / Restrictions Precautions Precautions: Fall Precaution Comments: monitor sats/HR     Mobility  Bed Mobility Overal bed mobility: Needs Assistance Bed Mobility: Sit to Supine           General bed mobility comments: increased time, assist with legs onto bed.    Transfers Overall transfer level: Needs assistance Equipment used: Rolling walker (2 wheels) Transfers: Sit to/from Stand, Bed to  chair/wheelchair/BSC Sit to Stand: Min assist           General transfer comment: assist with lines, stands at RW wi min guard, steps to turn to bed    Ambulation/Gait                   Stairs             Wheelchair Mobility    Modified Rankin (Stroke Patients Only)       Balance Overall balance assessment: Needs assistance Sitting-balance support: Feet supported Sitting balance-Leahy Scale: Good     Standing balance support: During functional activity, Bilateral upper extremity supported, Reliant on assistive device for balance Standing balance-Leahy Scale: Fair                              Cognition Arousal/Alertness: Awake/alert Behavior During Therapy: WFL for tasks assessed/performed Overall Cognitive Status: Within Functional Limits for tasks assessed                                          Exercises      General Comments        Pertinent Vitals/Pain Pain Assessment Pain Assessment: No/denies pain    Home Living                          Prior Function            PT Goals (current goals can now be found in the care plan section) Progress towards PT goals: Progressing toward goals  Frequency    Min 3X/week      PT Plan Current plan remains appropriate    Co-evaluation              AM-PAC PT "6 Clicks" Mobility   Outcome Measure  Help needed turning from your back to your side while in a flat bed without using bedrails?: A Little Help needed moving from lying on your back to sitting on the side of a flat bed without using bedrails?: A Little Help needed moving to and from a bed to a chair (including a wheelchair)?: A Little Help needed standing up from a chair using your arms (e.g., wheelchair or bedside chair)?: A Little Help needed to walk in hospital room?: Total Help needed climbing 3-5 steps with a railing? : Total 6 Click Score: 14    End of Session Equipment  Utilized During Treatment: Oxygen Activity Tolerance: Patient limited by fatigue Patient left: in bed;with call bell/phone within reach Nurse Communication: Mobility status;Other (comment) PT Visit Diagnosis: Other abnormalities of gait and mobility (R26.89);Muscle weakness (generalized) (M62.81);Difficulty in walking, not elsewhere classified (R26.2)     Time: 9191-6606 PT Time Calculation (min) (ACUTE ONLY): 15 min  Charges:  $Therapeutic Activity: 8-22 mins                     Tresa Endo PT Acute Rehabilitation Services Office [phone] Weekend [phone]    Claretha Cooper 08/22/2022, 1:43 PM

## 2022-08-23 DIAGNOSIS — I4891 Unspecified atrial fibrillation: Secondary | ICD-10-CM | POA: Diagnosis not present

## 2022-08-23 DIAGNOSIS — J180 Bronchopneumonia, unspecified organism: Secondary | ICD-10-CM | POA: Diagnosis not present

## 2022-08-23 LAB — GLUCOSE, CAPILLARY
Glucose-Capillary: 93 mg/dL (ref 70–99)
Glucose-Capillary: 82 mg/dL (ref 70–99)
Glucose-Capillary: 181 mg/dL — ABNORMAL HIGH (ref 70–99)
Glucose-Capillary: 120 mg/dL — ABNORMAL HIGH (ref 70–99)
Glucose-Capillary: 178 mg/dL — ABNORMAL HIGH (ref 70–99)

## 2022-08-23 LAB — BASIC METABOLIC PANEL
Anion gap: 8 (ref 5–15)
BUN: 26 mg/dL — ABNORMAL HIGH (ref 8–23)
CO2: 28 mmol/L (ref 22–32)
Calcium: 7.9 mg/dL — ABNORMAL LOW (ref 8.9–10.3)
Chloride: 100 mmol/L (ref 98–111)
Creatinine, Ser: 1.1 mg/dL — ABNORMAL HIGH (ref 0.44–1.00)
GFR, Estimated: 51 mL/min — ABNORMAL LOW (ref >60)
Glucose, Bld: 204 mg/dL — ABNORMAL HIGH (ref 70–99)
Potassium: 3.5 mmol/L (ref 3.5–5.1)
Sodium: 136 mmol/L (ref 135–145)

## 2022-08-23 MED ORDER — PREDNISONE 20 MG PO TABS
20.0000 mg | ORAL_TABLET | Freq: Every day | ORAL | Status: DC
  Administered 2022-08-24: 20 mg via ORAL
  Filled 2022-08-23: qty 1

## 2022-08-23 MED ORDER — AMIODARONE HCL 200 MG PO TABS: 200.0000 mg | ORAL_TABLET | Freq: Two times a day (BID) | ORAL | Status: DC

## 2022-08-23 MED ORDER — AMIODARONE HCL 200 MG PO TABS
400.0000 mg | ORAL_TABLET | Freq: Two times a day (BID) | ORAL | Status: DC
  Administered 2022-08-23 – 2022-08-25 (×5): 400 mg via ORAL
  Filled 2022-08-23 (×5): qty 2

## 2022-08-23 MED ORDER — FUROSEMIDE 40 MG PO TABS
40.0000 mg | ORAL_TABLET | Freq: Every day | ORAL | Status: DC
  Administered 2022-08-24 – 2022-08-25 (×2): 40 mg via ORAL
  Filled 2022-08-23 (×2): qty 1

## 2022-08-23 MED ORDER — AMIODARONE HCL 200 MG PO TABS: 200.0000 mg | ORAL_TABLET | Freq: Every day | ORAL | Status: DC

## 2022-08-23 NOTE — Plan of Care (Signed)

## 2022-08-23 NOTE — TOC Progression Note (Signed)
Transition of Care [facility]) - Progression Note    Patient Details  Name: KELCI PETRELLA MRN: [id number] Date of Birth: [birth year]  Transition of Care [facility]) CM/SW [place], RN Phone Number: 08/23/2022, 10:37 AM  Clinical Narrative:   Received [name] consult for CHF screening, patient is appropriate for CHF protocol. [facility] will resume HHPT services at discharge. Will await HHPT orders.  TOC will continue to follow for discharge needs.     Expected Discharge Plan: Home/Self Care Barriers to Discharge: Continued Medical Work up  Expected Discharge Plan and Services Expected Discharge Plan: Home/Self Care In-house Referral: NA Discharge Planning Services: CM Consult Post Acute Care Choice: NA Living arrangements for the past 2 months: Single Family Home                                       Social Determinants of Health (SDOH) Interventions Housing Interventions: Intervention Not Indicated  Readmission Risk Interventions    08/15/2022    1:52 PM  Readmission Risk Prevention Plan  Transportation Screening Complete  PCP or Specialist Appt within 5-7 Days Complete  Home Care Screening Complete  Medication Review (RN CM) Complete

## 2022-08-23 NOTE — Progress Notes (Signed)
Rounding Note    Patient Name: Debra Ryan Date of Encounter: 08/23/2022  [place] Cardiologist: [facility]   Subjective   Breathing improving. No chest pain.   Inpatient Medications    Scheduled Meds:  aspirin  81 mg Oral Daily   atorvastatin  40 mg Oral Daily   carvedilol  25 mg Oral BID WC   Chlorhexidine Gluconate Cloth  6 each Topical Daily   empagliflozin  10 mg Oral Daily   furosemide  40 mg Intravenous Daily   guaiFENesin  600 mg Oral BID   insulin aspart  0-5 Units Subcutaneous QHS   insulin aspart  0-9 Units Subcutaneous TID WC   insulin aspart  7 Units Subcutaneous TID WC   insulin glargine-yfgn  10 Units Subcutaneous QHS   insulin glargine-yfgn  17 Units Subcutaneous q morning   levothyroxine  200 mcg Oral QAC breakfast   magnesium oxide  400 mg Oral BID   methylPREDNISolone (SOLU-MEDROL) injection  40 mg Intravenous Daily   oxyCODONE  15 mg Oral Q12H   pantoprazole  80 mg Oral Daily   Rivaroxaban  15 mg Oral Q supper   sacubitril-valsartan  1 tablet Oral BID   spironolactone  25 mg Oral Daily   Continuous Infusions:  sodium chloride Stopped (08/20/22 1514)   sodium chloride Stopped (08/20/22 0423)   amiodarone 30 mg/hr (08/23/22 0600)   PRN Meds: sodium chloride, sodium chloride, albuterol, guaiFENesin-dextromethorphan, ondansetron (ZOFRAN) IV, oxyCODONE, prochlorperazine   Vital Signs    Vitals:   08/23/22 0726 08/23/22 0758 08/23/22 0800 08/23/22 0846  BP:   137/76 126/69  Pulse: 90 86 82 (!) 116  Resp: '18 14 14 15  '$ Temp:   98.1 F (36.7 C)   TempSrc:   Oral   SpO2: 93% 95% 93% 94%  Weight:      Height:        Intake/Output Summary (Last 24 hours) at 08/23/2022 0926 Last data filed at 08/23/2022 0800 Gross per 24 hour  Intake 411.16 ml  Output 1800 ml  Net -1388.84 ml      08/23/2022    5:00 AM 08/22/2022    5:00 AM 08/21/2022    5:00 AM  Last 3 Weights  Weight (lbs) 229 lb 4.5 oz 235 lb 14.3 oz  232 lb 12.9 oz  Weight (kg) 104 kg 107 kg 105.6 kg      Telemetry    Atrial fibrillation HR 90-100s - Personally Reviewed  ECG    N/A  Physical Exam   GEN: No acute distress.   Neck: No JVD Cardiac: IR IR , no murmurs, rubs, or gallops.  Respiratory: Clear to auscultation bilaterally. GI: Soft, nontender, non-distended  MS: Trace edema; No deformity. Neuro:  Nonfocal  Psych: Normal affect   Labs    High Sensitivity Troponin:   Recent Labs  Lab 08/14/22 1643 08/14/22 1932  TROPONINIHS 7 7     Chemistry Recent Labs  Lab 08/20/22 0322 08/21/22 0323 08/22/22 0728  NA 135 135 133*  K 3.7 4.2 3.5  CL 99 99 100  CO2 '28 25 23  '$ GLUCOSE 266* 313* 250*  BUN 16 27* 27*  CREATININE 1.10* 1.26* 1.11*  CALCIUM 8.7* 8.7* 8.3*  MG 2.0 2.1 2.2  GFRNONAA 51* 43* 51*  ANIONGAP '8 11 10    '$ Hematology Recent Labs  Lab 08/20/22 0322 08/21/22 0323 08/22/22 0728  WBC 11.5* 13.4* 13.0*  RBC 3.67* 3.71* 4.18  HGB 11.3* 11.2* 12.8  HCT 34.6* 34.8* 40.1  MCV 94.3 93.8 95.9  MCH 30.8 30.2 30.6  MCHC 32.7 32.2 31.9  RDW 13.9 14.1 14.1  PLT 181 189 168   Thyroid  Recent Labs  Lab 08/21/22 0323  TSH 0.956    BNP Recent Labs  Lab 08/21/22 0323  BNP 461.9*     Radiology    ECHOCARDIOGRAM LIMITED  Result Date: 08/21/2022    ECHOCARDIOGRAM LIMITED REPORT   Patient Name:   Debra Ryan Date of Exam: 08/21/2022 Medical Rec #:  [id number]           Height:       62.0 in Accession #:    [id number]          Weight:       232.8 lb Date of Birth:  1941-01-11           BSA:          2.039 m Patient Age:    81 years            BP:           130/84 mmHg Patient Gender: F                   HR:           85 bpm. Exam Location:  Inpatient Procedure: Limited Echo, Cardiac Doppler and Color Doppler Indications:    Pericardial effusion  History:        Patient has prior history of Echocardiogram examinations, most                 recent 08/22/2022.  Sonographer:    Harvie Junior  Referring Phys: [id number] Alphonse Guild BRANCH  Sonographer Comments: Technically difficult study due to poor echo windows. Image acquisition challenging due to patient body habitus. IMPRESSIONS  1. Left ventricular ejection fraction, by estimation, is 35%. Left ventricular endocardial border not optimally defined to evaluate regional wall motion.  2. Moderate pericardial effusion primarily surrounding the LV. There is no evidence of tamponade physiology. Stable from 08/19/22 study.  3. The inferior vena cava is normal in size with greater than 50% respiratory variability, suggesting right atrial pressure of 3 mmHg.  4. Limited echo to evaluate pericardial effusion. FINDINGS  Left Ventricle: Left ventricular ejection fraction, by estimation, is 35%. Left ventricular endocardial border not optimally defined to evaluate regional wall motion. Pericardium: Moderate pericardial effusion primarily surrounding the LV. There is no evidence of tamponade physiology. A moderately sized pericardial effusion is present. There is no evidence of cardiac tamponade. Venous: The inferior vena cava is normal in size with greater than 50% respiratory variability, suggesting right atrial pressure of 3 mmHg. Additional Comments: There is a moderate pleural effusion in the left lateral region. LEFT VENTRICLE PLAX 2D LVIDd:         5.10 cm      Diastology LVIDs:         4.20 cm      LV e' medial:    7.72 cm/s LV PW:         1.00 cm      LV E/e' medial:  14.2 LV IVS:        1.00 cm      LV e' lateral:   8.38 cm/s LVOT diam:     2.00 cm      LV E/e' lateral: 13.1 LVOT Area:     3.14 cm  LV Volumes (MOD) LV vol d, MOD A2C:  171.0 ml LV vol d, MOD A4C: 107.0 ml LV vol s, MOD A2C: 115.0 ml LV vol s, MOD A4C: 66.2 ml LV SV MOD A2C:     56.0 ml LV SV MOD A4C:     107.0 ml LV SV MOD BP:      53.9 ml RIGHT VENTRICLE RV S prime:     10.40 cm/s MITRAL VALVE MV Area (PHT): 4.94 cm       SHUNTS MV Decel Time: 154 msec       Systemic Diam: 2.00 cm MR Peak  grad:    114.3 mmHg MR Vmax:         534.67 cm/s MR PISA:         3.08 cm MR PISA Eff ROA: 16 mm MR PISA Radius:  0.70 cm MV E velocity: 110.00 cm/s MV A velocity: 37.50 cm/s MV E/A ratio:  2.93 Carlyle Dolly MD Electronically signed by Carlyle Dolly MD Signature Date/Time: 08/21/2022/12:56:56 PM    Final     Cardiac Studies   Limited echo 08/21/2022  1. Left ventricular ejection fraction, by estimation, is 35%. Left  ventricular endocardial border not optimally defined to evaluate regional  wall motion.   2. Moderate pericardial effusion primarily surrounding the LV. There is  no evidence of tamponade physiology. Stable from 08/19/22 study.   3. The inferior vena cava is normal in size with greater than 50%  respiratory variability, suggesting right atrial pressure of 3 mmHg.   4. Limited echo to evaluate pericardial effusion.    Echo 08/19/2022 1. Hypokinesis of the mid portion of the LV; akinesis of the distal  anterior, distal inferior and apical walls. . Left ventricular ejection  fraction, by estimation, is 35 to 40%. The left ventricle has moderately  decreased function. There is mild left  ventricular hypertrophy. Left ventricular diastolic parameters are  indeterminate.   2. Right ventricular systolic function is normal. The right ventricular  size is mildly enlarged.   3. Left atrial size was severely dilated.   4. Right atrial size was mildly dilated.   5. Large pericardial effusion. The pericardial effusion is  circumferential. Large pleural effusion.   6. Mild to moderate mitral valve regurgitation.   7. The aortic valve is tricuspid. Aortic valve regurgitation is not  visualized. Aortic valve sclerosis/calcification is present, without any  evidence of aortic stenosis.   8. The inferior vena cava is dilated in size with <50% respiratory  variability, suggesting right atrial pressure of 15 mmHg.   Patient Profile     81 y.o. 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 Louanne Belton.    Assessment & Plan    Atrial fibrillation/flutter with rapid ventricular rate -Elevated heart rate in setting of ongoing hypoxia, bronchopneumonia and heart failure -Started with IV diltiazem but discontinue due to drop in EF.  Treated with IV amiodarone.  Given rebolused yesterday -Heart rate currently stable in 90-100>>Will transition to Po '40mg'$  BID x 7 days then '200mg'$  BID x 7 and then '200mg'$  daily  -Continue carvedilol 25 mg twice daily -Continue Xarelto 15 mg for anticoagulation based on creatinine clearance   2.  Acute on chronic combined CHF -Echo this admission showed reduced LV function at 35 to 40% from  50 to 55% last month -Felt likely rate related but does have history of CAD.  Recommended medical therapy and repeat echocardiogram in few month.  Follow-up with regular cardiology. --Net INO -5.5L L. Weight down 245>>229lb -Continue carvedilol 25 mg twice daily -Continue Entresto 24/26 mg twice daily >> titrate if stable renal function  -Continue  IV lasix '40mg'$  qd >> pending BMP today. Likely PO tomorrow -Continue Jardiance    3.  Pericardial effusion -Repeat echo  showed stable moderate pericardial effusion without tamponade future - Continue lasix    4. CAD with history of multiple stenting -On ASA and Xarelto PTA - Continue BB and statin   For questions or updates, please contact [place] Please consult [email or web address] for contact info under        SignedLeanor Kail, PA  08/23/2022, 9:26 AM

## 2022-08-23 NOTE — Progress Notes (Signed)
PROGRESS NOTE    Debra Ryan  ZOX:[id number] DOB: 1941-01-01 DOA: 08/14/2022 PCP: No primary care provider on file.    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  Patient was recently admitted to the hospital for pneumonia and decompensated CHF, hypoglycemia and UTI.  She had gone home from a SNF 2 days ago only.  In the emergency room afebrile, hypoxemic 89% on room air, 2 L for compensation that is her baseline.  COVID-19 negative.  Chest x-ray with bronchial wall thickening with mild patchy opacities lung bases, bilateral interstitial thickening.  During hospitalization she had developed respiratory distress needing BiPAP, also developed rapid A-fib and currently on amiodarone drip.  Followed by cardiology.   Assessment & Plan:   Bronchopneumonia: Chest x-ray consistent with bronchitis/bronchopneumonia.  No evidence of sepsis.  WBC count normal. COVID 19 influenza negative.  Procalcitonin less than 0.1.  Initially on IV vancomycin and cefepime and now symptomatically treated.  Completed antibiotics.  Blood cultures negative.  Oxygenation improving. Continue bronchodilators, diuretics and steroids, will gradually taper off. Changed to prednisone 20 mg starting tomorrow morning.  Acute hypoxemic respiratory failure with respiratory distress: Needing BiPAP.  Developed respiratory failure needing 5 L of oxygen by nasal cannula.  Currently improved and on 2 L oxygen that she uses at home.  She is using CPAP at night.  Stabilized.  Paroxysmal A-fib with RVR: Increasing dose of Coreg.  2D  echocardiogram with low ejection fraction.  Cardizem was discontinued.   Patient was treated with amiodarone drip, changed to amiodarone oral today.   Acute on chronic diastolic heart failure: New EF with ejection fraction 35 to 40%.  Treated with IV Lasix.  5.5 L negative balance.  Changing to oral Lasix today.  Already on Entresto, Coreg, aspirin and Lipitor.  CKD stage IIIa: Creatinine remains at about baseline.  Monitor closely.  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  Outpatient oncology follow-up.  Type 1 diabetes with hyperglycemia: Patient on insulin pump at home.  Currently remains on subcu insulin while hospitalized.  Will resume insulin pump on discharge.  Chronic pain syndrome: Managed with oxycodone.  Obstructive sleep apnea: Wearing CPAP at night.  Physical deconditioning: Work with PT OT today.  Anticipate home with home health therapies next 24 to 48 hours.   DVT prophylaxis: Place and maintain sequential compression device Start: 08/22/22 1014 Rivaroxaban (XARELTO) tablet 15 mg   Code Status: DNR Family Communication: Husband on the phone 10/23. Disposition Plan: Status is: Inpatient Remains inpatient appropriate because: Heart rate control, medication adjustment.     Consultants:  Cardiology  Procedures:  None  Antimicrobials:  Vancomycin and cefepime, completed therapies.   Subjective:  Patient seen and examined.  Denies any complaints today.  Slept well last night with her CPAP.  Denies any chest pain or palpitations.  Telemetry with rate controlled A-fib.  She is ready to get out of the bed and walk.  Objective: Vitals:   08/23/22 0900 08/23/22 1000 08/23/22 1027 08/23/22 1126  BP: (!) 145/61 (!) 101/59 (!) 96/35 (!) 88/56  Pulse: 80 (!) 111 65 87  Resp: 17 19 (!) 22 (!) 0  Temp:  TempSrc:      SpO2: 91% 91% 93% 94%  Weight:      Height:        Intake/Output Summary (Last 24 hours) at 08/23/2022  1132 Last data filed at 08/23/2022 0800 Gross per 24 hour  Intake 377.87 ml  Output 1800 ml  Net -1422.13 ml   Filed Weights   08/21/22 0500 08/22/22 0500 08/23/22 0500  Weight: 105.6 kg 107 kg 104 kg    Examination:  General exam: Appears calm and comfortable , Chronically sick looking.  Not in any distress.  Currently on 2 L oxygen. Respiratory system: Poor bilateral air entry.  No added sounds. Cardiovascular system: S1 & S2 heard, irregularly irregular.  No JVD, murmurs, rubs, gallops or clicks.  1+ bilateral pedal edema.   Gastrointestinal system: Abdomen is nondistended, soft and nontender. No organomegaly or masses felt. Normal bowel sounds heard. Central nervous system: Alert and oriented. No focal neurological deficits.  Profoundly debilitated. Extremities: Symmetric 5 x 5 power.  Data Reviewed: I have personally reviewed following labs and imaging studies  CBC: Recent Labs  Lab 08/18/22 0528 08/19/22 0854 08/20/22 0322 08/21/22 0323 08/22/22 0728  WBC 5.4 6.3 11.5* 13.4* 13.0*  HGB 10.4* 12.0 11.3* 11.2* 12.8  HCT 32.9* 37.9 34.6* 34.8* 40.1  MCV 96.5 97.4 94.3 93.8 95.9  PLT 177 189 181 189 456   Basic Metabolic Panel: Recent Labs  Lab 08/18/22 0528 08/19/22 0854 08/20/22 0322 08/21/22 0323 08/22/22 0728 08/23/22 1036  NA 139 133* 135 135 133* 136  K 3.5 4.0 3.7 4.2 3.5 3.5  CL 105 98 99 99 100 100  CO2 '27 26 28 25 23 28  '$ GLUCOSE 207* 343* 266* 313* 250* 204*  BUN '15 15 16 '$ 27* 27* 26*  CREATININE 1.07* 1.14* 1.10* 1.26* 1.11* 1.10*  CALCIUM 8.6* 8.6* 8.7* 8.7* 8.3* 7.9*  MG 1.8 1.6* 2.0 2.1 2.2  --    GFR: Estimated Creatinine Clearance: 46.9 mL/min (A) (by C-G formula based on SCr of 1.1 mg/dL (H)). Liver Function Tests: No results for input(s): "AST", "ALT", "ALKPHOS", "BILITOT", "PROT", "ALBUMIN" in the last 168 hours. No results for input(s): "LIPASE", "AMYLASE" in the last 168 hours. No results for input(s): "AMMONIA" in the last 168  hours. Coagulation Profile: No results for input(s): "INR", "PROTIME" in the last 168 hours. Cardiac Enzymes: No results for input(s): "CKTOTAL", "CKMB", "CKMBINDEX", "TROPONINI" in the last 168 hours. BNP (last 3 results) No results for input(s): "PROBNP" in the last 8760 hours. HbA1C: No results for input(s): "HGBA1C" in the last 72 hours. CBG: Recent Labs  Lab 08/22/22 1205 08/22/22 1602 08/22/22 2136 08/23/22 0343 08/23/22 0745  GLUCAP 221* 173* 142* 93 82   Lipid Profile: No results for input(s): "CHOL", "HDL", "LDLCALC", "TRIG", "CHOLHDL", "LDLDIRECT" in the last 72 hours. Thyroid Function Tests: Recent Labs    08/21/22 0323  TSH 0.956   Anemia Panel: No results for input(s): "VITAMINB12", "FOLATE", "FERRITIN", "TIBC", "IRON", "RETICCTPCT" in the last 72 hours. Sepsis Labs: No results for input(s): "PROCALCITON", "LATICACIDVEN" in the last 168 hours.  Recent Results (from the past 240 hour(s))  Blood culture (routine x 2)     Status: None   Collection Time: 08/14/22  4:44 PM   Specimen: Right Antecubital; Blood  Result Value Ref Range Status   Specimen Description   Final    RIGHT ANTECUBITAL BLOOD Performed at [facility], [address], [place], [place] [address]    Special Requests  Final    Blood Culture adequate volume BOTTLES DRAWN AEROBIC AND ANAEROBIC Performed at [facility], [address] [place], [place] [address]    Culture   Final    NO GROWTH 5 DAYS Performed at [facility], [place] [address] [place], [place] [address]    Report Status 08/19/2022 FINAL  Final  SARS Coronavirus 2 by RT PCR (hospital order, performed in [facility] hospital lab) *cepheid single result test* Anterior Nasal Swab     Status: None   Collection Time: 08/14/22  5:25 PM   Specimen: Anterior Nasal Swab  Result Value Ref Range Status   SARS Coronavirus 2 by RT PCR NEGATIVE NEGATIVE Final    Comment: (NOTE) SARS-CoV-2 target nucleic  acids are NOT DETECTED.  The SARS-CoV-2 RNA is generally detectable in upper and lower respiratory specimens during the acute phase of infection. The lowest concentration of SARS-CoV-2 viral copies this assay can detect is 250 copies / mL. A negative result does not preclude SARS-CoV-2 infection and should not be used as the sole basis for treatment or other patient management decisions.  A negative result may occur with improper specimen collection / handling, submission of specimen other than nasopharyngeal swab, presence of viral mutation(s) within the areas targeted by this assay, and inadequate number of viral copies (<250 copies / mL). A negative result must be combined with clinical observations, patient history, and epidemiological information.  Fact Sheet for Patients:   https://www.patel.info/  Fact Sheet for Healthcare Providers: https://hall.com/  This test is not yet approved or  cleared by the [place] FDA and has been authorized for detection and/or diagnosis of SARS-CoV-2 by FDA under an Emergency Use Authorization (EUA).  This EUA will remain in effect (meaning this test can be used) for the duration of the COVID-19 declaration under Section 564(b)(1) of the Act, 21 U.S.C. section 360bbb-3(b)(1), unless the authorization is terminated or revoked sooner.  Performed at [facility], [address] [place], [place] [address]   MRSA Next Gen by PCR, Nasal     Status: None   Collection Time: 08/14/22  5:31 PM   Specimen: Nasal Mucosa; Nasal Swab  Result Value Ref Range Status   MRSA by PCR Next Gen NOT DETECTED NOT DETECTED Final    Comment: (NOTE) The GeneXpert MRSA Assay (FDA approved for NASAL specimens only), is one component of a comprehensive MRSA colonization surveillance program. It is not intended to diagnose MRSA infection nor to guide or monitor treatment for MRSA infections. Test performance is  not FDA approved in patients less than 50 years old. Performed at [facility], [place] [address], [place], [place] [address]   Blood culture (routine x 2)     Status: None   Collection Time: 08/14/22  5:43 PM   Specimen: Left Antecubital; Blood  Result Value Ref Range Status   Specimen Description   Final    LEFT ANTECUBITAL BLOOD Performed at [facility], [address] [place], [place] [address]    Special Requests   Final    Blood Culture adequate volume BOTTLES DRAWN AEROBIC AND ANAEROBIC Performed at [facility], [address] [place], [place] [address]    Culture   Final    NO GROWTH 5 DAYS Performed at [facility], [place] [address] [place],  [address]    Report Status 08/19/2022 FINAL  Final  Radiology Studies: No results found.      Scheduled Meds:  [START ON 08/30/2022] amiodarone  200 mg Oral BID   [START ON 09/06/2022] amiodarone  200 mg Oral Daily   amiodarone  400 mg Oral BID   aspirin  81 mg Oral Daily   atorvastatin  40 mg Oral Daily   carvedilol  25 mg Oral BID WC   Chlorhexidine Gluconate Cloth  6 each Topical Daily   empagliflozin  10 mg Oral Daily   [START ON 08/24/2022] furosemide  40 mg Oral Daily   guaiFENesin  600 mg Oral BID   insulin aspart  0-5 Units Subcutaneous QHS   insulin aspart  0-9 Units Subcutaneous TID WC   insulin aspart  7 Units Subcutaneous TID WC   insulin glargine-yfgn  10 Units Subcutaneous QHS   insulin glargine-yfgn  17 Units Subcutaneous q morning   levothyroxine  200 mcg Oral QAC breakfast   magnesium oxide  400 mg Oral BID   oxyCODONE  15 mg Oral Q12H   pantoprazole  80 mg Oral Daily   [START ON 08/24/2022] predniSONE  20 mg Oral Q breakfast   Rivaroxaban  15 mg Oral Q supper   sacubitril-valsartan  1 tablet Oral BID   spironolactone  25 mg Oral Daily   Continuous Infusions:  sodium chloride Stopped (08/20/22 1514)   sodium chloride Stopped  (08/20/22 0423)     LOS: 9 days    Time spent: 35 minutes    Barb Merino, MD [facility] Pager [phone]

## 2022-08-24 DIAGNOSIS — J180 Bronchopneumonia, unspecified organism: Secondary | ICD-10-CM | POA: Diagnosis not present

## 2022-08-24 LAB — GLUCOSE, CAPILLARY
Glucose-Capillary: 155 mg/dL — ABNORMAL HIGH (ref 70–99)
Glucose-Capillary: 147 mg/dL — ABNORMAL HIGH (ref 70–99)
Glucose-Capillary: 138 mg/dL — ABNORMAL HIGH (ref 70–99)
Glucose-Capillary: 131 mg/dL — ABNORMAL HIGH (ref 70–99)
Glucose-Capillary: 119 mg/dL — ABNORMAL HIGH (ref 70–99)

## 2022-08-24 LAB — BASIC METABOLIC PANEL
Anion gap: 7 (ref 5–15)
BUN: 28 mg/dL — ABNORMAL HIGH (ref 8–23)
CO2: 29 mmol/L (ref 22–32)
Calcium: 8 mg/dL — ABNORMAL LOW (ref 8.9–10.3)
Chloride: 100 mmol/L (ref 98–111)
Creatinine, Ser: 1.22 mg/dL — ABNORMAL HIGH (ref 0.44–1.00)
GFR, Estimated: 45 mL/min — ABNORMAL LOW (ref >60)
Glucose, Bld: 171 mg/dL — ABNORMAL HIGH (ref 70–99)
Potassium: 3.6 mmol/L (ref 3.5–5.1)
Sodium: 136 mmol/L (ref 135–145)

## 2022-08-24 NOTE — Evaluation (Signed)
Occupational Therapy Evaluation Patient Details Name: Debra Ryan MRN: [id number] DOB: 1941-06-27 Today's Date: 08/24/2022   History of Present Illness Debra Ryan is a 81 y.o. female admitted with bronchopneumonia. Patient was transitioned down to step down unit on 10/19 with increased need for supplemental o2. 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   Clinical Impression   Patient is a 81 year old female who was admitted for above. Patient was living at home alone prior level with support from daughter for IADLs. Patient was supervision for bed mobility min A for hygiene in standing with education on strategies to avoid UTIs. Patient was noted to have decreased functional activity tolerance, decreased endurance, decreased standing balance, decreased safety awareness, and decreased knowledge of AD/AE impacting participation in ADLs. Patient would continue to benefit from skilled OT services at this time while admitted and after d/c to address noted deficits in order to improve overall safety and independence in ADLs.        Recommendations for follow up therapy are one component of a multi-disciplinary discharge planning process, led by the attending physician.  Recommendations may be updated based on patient status, additional functional criteria and insurance authorization.   Follow Up Recommendations  Home health OT    Assistance Recommended at Discharge Frequent or constant Supervision/Assistance  Patient can return home with the following A little help with walking and/or transfers;A little help with bathing/dressing/bathroom;Assistance with cooking/housework;Direct supervision/assist for medications  management;Direct supervision/assist for financial management;Help with stairs or ramp for entrance;Assist for transportation    Functional Status Assessment  Patient has had a recent decline in their functional status and demonstrates the ability to make significant improvements in function in a reasonable and predictable amount of time.  Equipment Recommendations       Recommendations for Other Services       Precautions / Restrictions Precautions Precautions: Fall Precaution Comments: monitor sats/HR Restrictions Weight Bearing Restrictions: No      Mobility Bed Mobility Overal bed mobility: Needs Assistance Bed Mobility: Sit to Supine       Sit to supine: Supervision   General bed mobility comments: Supervision for safety, no physical assist required    Transfers Overall transfer level: Needs assistance Equipment used: Rolling walker (2 wheels) Transfers: Sit to/from Stand, Bed to chair/wheelchair/BSC Sit to Stand: Min guard           General transfer comment: Min guard, no physical assist required. Pt flexed forward in stance with cuing able to stand up fully. RN provided pericare and then pt demonstrated ability to perform the same with min guard for safety during standing. Pt completed sit to stand transfer x5      Balance Overall balance assessment: Needs assistance Sitting-balance support: Feet supported Sitting balance-Leahy Scale: Good     Standing balance support: During functional activity, Bilateral upper extremity supported, Reliant on assistive device for balance Standing balance-Leahy Scale: Fair                             ADL either performed or assessed with clinical judgement   ADL Overall ADL's : Needs assistance/impaired Eating/Feeding: Set up;Sitting   Grooming: Set up;Sitting;Wash/dry face   Upper Body Bathing: Set up;Sitting   Lower Body  Bathing: Moderate assistance;Sitting/lateral leans   Upper Body Dressing : Set  up;Sitting   Lower Body Dressing: Moderate assistance;Sit to/from stand;Sitting/lateral leans   Toilet Transfer: Minimal assistance;Ambulation;Rolling walker (2 wheels)   Toileting- Clothing Manipulation and Hygiene: Minimal assistance;Sit to/from stand Toileting - Clothing Manipulation Details (indicate cue type and reason): patient was educated on importance of attempting to complete task herself v.s. letting staff complete for her. patient was able to participate but noted to attempt to wipe front to back. patient was stopped and educated on strategies to avoid UTIs. patient verbalized understanding and was provided with new washcloth.             Vision Patient Visual Report: No change from baseline       Perception     Praxis      Pertinent Vitals/Pain Pain Assessment Pain Assessment: No/denies pain     Hand Dominance     Extremity/Trunk Assessment Upper Extremity Assessment Upper Extremity Assessment: Overall WFL for tasks assessed   Lower Extremity Assessment Lower Extremity Assessment: Defer to PT evaluation   Cervical / Trunk Assessment Cervical / Trunk Assessment: Normal   Communication Communication Communication: No difficulties   Cognition Arousal/Alertness: Awake/alert Behavior During Therapy: WFL for tasks assessed/performed Overall Cognitive Status: Within Functional Limits for tasks assessed                                       General Comments       Exercises     Shoulder Instructions      Home Living Family/patient expects to be discharged to:: Private residence   Available Help at Discharge: Family Type of Home: House Home Access: Ramped entrance     Home Layout: One level     Bathroom Shower/Tub: Walk-in shower         Home Equipment: Conservation officer, nature (2 wheels);Rollator (4 wheels);Wheelchair - power;Electric scooter;BSC/3in1;Grab bars - tub/shower;Grab bars - toilet;Shower seat - built in   Additional  Comments: pt reports spouse at home using RW, gets HHPT and unable to assist her physically      Prior Functioning/Environment Prior Level of Function : Independent/Modified Independent;Driving             Mobility Comments: pt reports using rollator in the home and community, denies falls in last year ADLs Comments: pt reports ind with self care and cooking light meals, has cleaning lady        OT Problem List: Decreased activity tolerance;Decreased coordination;Cardiopulmonary status limiting activity;Decreased knowledge of precautions;Decreased safety awareness;Pain      OT Treatment/Interventions: Self-care/ADL training;Therapeutic exercise;Neuromuscular education;Energy conservation;DME and/or AE instruction;Therapeutic activities;Balance training;Patient/family education    OT Goals(Current goals can be found in the care plan section) Acute Rehab OT Goals Patient Stated Goal: to go home OT Goal Formulation: With patient Time For Goal Achievement: 09/07/22 Potential to Achieve Goals: Fair  OT Frequency: Min 2X/week    Co-evaluation PT/OT/SLP Co-Evaluation/Treatment: Yes Reason for Co-Treatment: For patient/therapist safety PT goals addressed during session: Mobility/safety with mobility OT goals addressed during session: ADL's and self-care      AM-PAC OT "6 Clicks" Daily Activity     Outcome Measure Help from another person eating meals?: None Help from another person taking care of personal grooming?: A Little Help from another person toileting, which includes using toliet, bedpan, or urinal?: A Little Help from another person bathing (including washing, rinsing, drying)?: A Little  Help from another person to put on and taking off regular upper body clothing?: A Little Help from another person to put on and taking off regular lower body clothing?: A Lot 6 Click Score: 18   End of Session Equipment Utilized During Treatment: Gait belt;Rolling walker (2  wheels)  Activity Tolerance: Patient tolerated treatment well Patient left: in chair;with call bell/phone within reach;with chair alarm set  OT Visit Diagnosis: Unsteadiness on feet (R26.81);Other abnormalities of gait and mobility (R26.89);Muscle weakness (generalized) (M62.81)                Time: 9444-6190 OT Time Calculation (min): 21 min Charges:  OT General Charges $OT Visit: 1 Visit OT Evaluation $OT Eval Moderate Complexity: 1 Mod  Zaylee Cornia OTR/L, MS Acute Rehabilitation Department Office# [phone]   Feliz Beam Odell Choung 08/24/2022, 3:15 PM

## 2022-08-24 NOTE — Plan of Care (Signed)
On oral cardiac medications. No changes anticipated from a cardiology perspective. Cardiology will sign off.

## 2022-08-24 NOTE — Progress Notes (Signed)
PROGRESS NOTE    Debra Ryan  XLK:[id number] DOB: [birth year] DOA: 08/14/2022 PCP: No primary care provider on file.    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  Patient was recently admitted to the hospital for pneumonia and decompensated CHF, hypoglycemia and UTI.  She had gone home from a SNF 2 days ago only.  In the emergency room afebrile, hypoxemic 89% on room air, 2 L for compensation that is her baseline.  COVID-19 negative.  Chest x-ray with bronchial wall thickening with mild patchy opacities lung bases, bilateral interstitial thickening.  During hospitalization she had developed respiratory distress needing BiPAP, also developed rapid A-fib and currently on amiodarone drip.  Followed by cardiology.   Assessment & Plan:   Bronchopneumonia: Chest x-ray consistent with bronchitis/bronchopneumonia.  No evidence of sepsis.  WBC count normal. COVID 19 influenza negative.  Procalcitonin less than 0.1.  Initially on IV vancomycin and cefepime and now symptomatically treated.  Completed antibiotics.  Blood cultures negative.  Oxygenation improving. Continue bronchodilators, diuretics and steroids, will gradually taper off. Changed to prednisone 20 mg , will gradually taper off.   Acute hypoxemic respiratory failure with respiratory distress: Needing BiPAP.  Developed respiratory failure needing 5 L of oxygen by nasal cannula.  Currently improved and on 2 L oxygen that she uses at home.  She is using CPAP at night.  Stabilized. May need some oxygen in the daytime also.  will need  evaluation.  Paroxysmal A-fib with RVR: Increasing dose of Coreg.  2D echocardiogram with low ejection fraction.  Cardizem was discontinued.   Patient was treated with amiodarone drip, changed to amiodarone oral still remains on A-fib but rate controlled.  Acute on chronic diastolic heart failure: New EF with ejection fraction 35 to 40%.  Treated with IV Lasix.  5.5 L negative balance.  Changing to oral Lasix.  Already on Entresto, Jardiance, Coreg, aspirin and Lipitor. Compression stockings on the legs.  CKD stage IIIa: Creatinine remains at about baseline.  Monitor closely.  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  Outpatient oncology follow-up.  Type 1 diabetes with hyperglycemia: Patient on insulin pump at home.  Currently remains on subcu insulin while hospitalized.  Will resume insulin pump on discharge.  Levels are acceptable.  Chronic pain syndrome: Managed with oxycodone.  Obstructive sleep apnea: Wearing CPAP at night.  Physical deconditioning: Work with PT OT today.  Anticipate home with home health therapies next 24 to 48 hours. Can transfer to telemetry bed today.  Mobilize more.  Anticipate home tomorrow.   DVT prophylaxis: Place and maintain sequential compression device Start: 08/22/22 1014 Rivaroxaban (XARELTO) tablet 15 mg   Code Status: DNR Family Communication: Husband on the phone, called and updated.  Disposition Plan: Status is: Inpatient Remains inpatient appropriate because: Heart rate control, medication adjustment. Mobility to prepare for discharge.     Consultants:  Cardiology  Procedures:  None  Antimicrobials:  Vancomycin and cefepime, completed therapies.   Subjective:  Patient seen and examined.  Legs are swollen but denies any other complaints.  Breathing is better.  No trouble overnight and stayed on CPAP. Telemetry shows A-fib with heart rate 90-105.  Patient denies any chest pain or  palpitations.  Objective: Vitals:   08/24/22 0704 08/24/22 0706 08/24/22 0707 08/24/22 0733  BP:      Pulse: 77 68 72 94  Resp: '14 16 10 '$ (!) 21  Temp:   (!) 97.4 F (36.3 C)   TempSrc:   Oral   SpO2: (!) 88% 90% 91% 98%  Weight:      Height:        Intake/Output Summary (Last 24 hours) at 08/24/2022 0847 Last data filed at 08/24/2022 0539 Gross per 24 hour  Intake 848.68 ml  Output 575 ml  Net 273.68 ml   Filed Weights   08/22/22 0500 08/23/22 0500 08/24/22 0500  Weight: 107 kg 104 kg 102.1 kg    Examination:  General exam: Appears calm and comfortable , reading a book.  Frail.  Not in any distress.  Currently on 2 L oxygen. Respiratory system: Poor bilateral air entry.  No added sounds. Cardiovascular system: S1 & S2 heard, irregularly irregular.  No JVD, murmurs, rubs, gallops or clicks.  2+ bilateral pedal edema.   Gastrointestinal system: Abdomen is nondistended, soft and nontender. No organomegaly or masses felt. Normal bowel sounds heard. Central nervous system: Alert and oriented. No focal neurological deficits.  Profoundly debilitated. Extremities: Symmetric 5 x 5 power.  Data Reviewed: I have personally reviewed following labs and imaging studies  CBC: Recent Labs  Lab 08/18/22 0528 08/19/22 0854 08/20/22 0322 08/21/22 0323 08/22/22 0728  WBC 5.4 6.3 11.5* 13.4* 13.0*  HGB 10.4* 12.0 11.3* 11.2* 12.8  HCT 32.9* 37.9 34.6* 34.8* 40.1  MCV 96.5 97.4 94.3 93.8 95.9  PLT 177 189 181 189 242   Basic Metabolic Panel: Recent Labs  Lab 08/18/22 0528 08/19/22 0854 08/20/22 0322 08/21/22 0323 08/22/22 0728 08/23/22 1036 08/24/22 0254  NA 139 133* 135 135 133* 136 136  K 3.5 4.0 3.7 4.2 3.5 3.5 3.6  CL 105 98 99 99 100 100 100  CO2 '27 26 28 25 23 28 29  '$ GLUCOSE 207* 343* 266* 313* 250* 204* 171*  BUN '15 15 16 '$ 27* 27* 26* 28*  CREATININE 1.07* 1.14* 1.10* 1.26* 1.11* 1.10* 1.22*  CALCIUM 8.6* 8.6* 8.7* 8.7* 8.3* 7.9* 8.0*  MG 1.8 1.6* 2.0 2.1 2.2   --   --    GFR: Estimated Creatinine Clearance: 41.9 mL/min (A) (by C-G formula based on SCr of 1.22 mg/dL (H)). Liver Function Tests: No results for input(s): "AST", "ALT", "ALKPHOS", "BILITOT", "PROT", "ALBUMIN" in the last 168 hours. No results for input(s): "LIPASE", "AMYLASE" in the last 168 hours. No results for input(s): "AMMONIA" in the last 168 hours. Coagulation Profile: No results for input(s): "INR", "PROTIME" in the last 168 hours. Cardiac Enzymes: No results for input(s): "CKTOTAL", "CKMB", "CKMBINDEX", "TROPONINI" in the last 168 hours. BNP (last 3 results) No results for input(s): "PROBNP" in the last 8760 hours. HbA1C: No results for input(s): "HGBA1C" in the last 72 hours. CBG: Recent Labs  Lab 08/23/22 1202 08/23/22 1642 08/23/22 2151 08/24/22 0337 08/24/22 0712  GLUCAP 181* 120* 178* 155* 147*   Lipid Profile: No results for input(s): "CHOL", "HDL", "LDLCALC", "TRIG", "CHOLHDL", "LDLDIRECT" in the last 72 hours. Thyroid Function Tests: No results for input(s): "TSH", "T4TOTAL", "FREET4", "T3FREE", "THYROIDAB" in the last 72 hours.  Anemia Panel: No results for input(s): "VITAMINB12", "FOLATE", "FERRITIN", "TIBC", "IRON", "RETICCTPCT" in the last 72 hours. Sepsis Labs: No results for input(s): "PROCALCITON", "LATICACIDVEN" in the last 168 hours.  Recent Results (  from the past 240 hour(s))  Blood culture (routine x 2)     Status: None   Collection Time: 08/14/22  4:44 PM   Specimen: Right Antecubital; Blood  Result Value Ref Range Status   Specimen Description   Final    RIGHT ANTECUBITAL BLOOD Performed at [facility], [address] [place], [place] [address]    Special Requests   Final    Blood Culture adequate volume BOTTLES DRAWN AEROBIC AND ANAEROBIC Performed at [facility], [address] [place], [place] [address]    Culture   Final    NO GROWTH 5 DAYS Performed at [facility], [place] [address]  [place], [place] [address]    Report Status 08/19/2022 FINAL  Final  SARS Coronavirus 2 by RT PCR (hospital order, performed in [facility] hospital lab) *cepheid single result test* Anterior Nasal Swab     Status: None   Collection Time: 08/14/22  5:25 PM   Specimen: Anterior Nasal Swab  Result Value Ref Range Status   SARS Coronavirus 2 by RT PCR NEGATIVE NEGATIVE Final    Comment: (NOTE) SARS-CoV-2 target nucleic acids are NOT DETECTED.  The SARS-CoV-2 RNA is generally detectable in upper and lower respiratory specimens during the acute phase of infection. The lowest concentration of SARS-CoV-2 viral copies this assay can detect is 250 copies / mL. A negative result does not preclude SARS-CoV-2 infection and should not be used as the sole basis for treatment or other patient management decisions.  A negative result may occur with improper specimen collection / handling, submission of specimen other than nasopharyngeal swab, presence of viral mutation(s) within the areas targeted by this assay, and inadequate number of viral copies (<250 copies / mL). A negative result must be combined with clinical observations, patient history, and epidemiological information.  Fact Sheet for Patients:   https://www.patel.info/  Fact Sheet for Healthcare Providers: https://hall.com/  This test is not yet approved or  cleared by the [place] FDA and has been authorized for detection and/or diagnosis of SARS-CoV-2 by FDA under an Emergency Use Authorization (EUA).  This EUA will remain in effect (meaning this test can be used) for the duration of the COVID-19 declaration under Section 564(b)(1) of the Act, 21 U.S.C. section 360bbb-3(b)(1), unless the authorization is terminated or revoked sooner.  Performed at [facility], [address] [place], [place] [address]   MRSA Next Gen by PCR, Nasal     Status: None   Collection Time: 08/14/22   5:31 PM   Specimen: Nasal Mucosa; Nasal Swab  Result Value Ref Range Status   MRSA by PCR Next Gen NOT DETECTED NOT DETECTED Final    Comment: (NOTE) The GeneXpert MRSA Assay (FDA approved for NASAL specimens only), is one component of a comprehensive MRSA colonization surveillance program. It is not intended to diagnose MRSA infection nor to guide or monitor treatment for MRSA infections. Test performance is not FDA approved in patients less than 34 years old. Performed at [facility], [place] [address] [place], [place] [address]   Blood culture (routine x 2)     Status: None   Collection Time: 08/14/22  5:43 PM   Specimen: Left Antecubital; Blood  Result Value Ref Range Status   Specimen Description   Final    LEFT ANTECUBITAL BLOOD Performed at [facility], [address] [place], [place] [address]    Special  Requests   Final    Blood Culture adequate volume BOTTLES DRAWN AEROBIC AND ANAEROBIC Performed at [facility], [address] [place], [place] [address]    Culture   Final    NO GROWTH 5 DAYS Performed at [facility], [place] [address], [place], [place] [address]    Report Status 08/19/2022 FINAL  Final         Radiology Studies: No results found.      Scheduled Meds:  [START ON 08/30/2022] amiodarone  200 mg Oral BID   [START ON 09/06/2022] amiodarone  200 mg Oral Daily   amiodarone  400 mg Oral BID   aspirin  81 mg Oral Daily   atorvastatin  40 mg Oral Daily   carvedilol  25 mg Oral BID WC   Chlorhexidine Gluconate Cloth  6 each Topical Daily   empagliflozin  10 mg Oral Daily   furosemide  40 mg Oral Daily   guaiFENesin  600 mg Oral BID   insulin aspart  0-5 Units Subcutaneous QHS   insulin aspart  0-9 Units Subcutaneous TID WC   insulin aspart  7 Units Subcutaneous TID WC   insulin glargine-yfgn  10 Units Subcutaneous QHS   insulin glargine-yfgn  17 Units Subcutaneous q morning   levothyroxine  200  mcg Oral QAC breakfast   magnesium oxide  400 mg Oral BID   oxyCODONE  15 mg Oral Q12H   pantoprazole  80 mg Oral Daily   predniSONE  20 mg Oral Q breakfast   Rivaroxaban  15 mg Oral Q supper   sacubitril-valsartan  1 tablet Oral BID   spironolactone  25 mg Oral Daily   Continuous Infusions:  sodium chloride Stopped (08/20/22 1514)   sodium chloride Stopped (08/20/22 0423)     LOS: 10 days    Time spent: 35 minutes    Barb Merino, MD [facility] Pager [phone]

## 2022-08-24 NOTE — Progress Notes (Signed)
Physical Therapy Treatment Patient Details Name: Debra Ryan MRN: [id number] DOB: Dec 01, 1940 Today's Date: 08/24/2022   History of Present Illness Debra Ryan is a 81 y.o. female admitted with bronchopneumonia. 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    PT Comments    Pt received supine in bed agreeable to mobilize. BP prior to session 113/64 supine in bed. Pt required supervision for bed mobility, min guard for transfers and ambulation in hallway, +2 recliner follow for safety; pt required two seated rest breaks secondary to fatigue. Pt on RA throughout, SpO2 ranged from 86-96% with poor pleth and recovered quickly with pursed lip breathing. HR ranged from 81-120 with activity. BP in sitting at end of session 94/47 RN notified of soft BP. Pt expressing fatigue but was happy with her progress today. We will continue to follow acutely.    Recommendations for follow up therapy are one component of a multi-disciplinary discharge planning process, led by the attending physician.  Recommendations may be updated based on patient status, additional functional criteria and insurance authorization.  Follow Up Recommendations  Home health PT     Assistance Recommended at Discharge Intermittent Supervision/Assistance  Patient can return home with the following A little help with walking and/or transfers;A little help with bathing/dressing/bathroom;Assistance with cooking/housework;Assist for transportation;Help with stairs or ramp for entrance   Equipment Recommendations  None recommended by PT    Recommendations for Other Services       Precautions / Restrictions Precautions Precautions: Fall Precaution  Comments: monitor sats/HR Restrictions Weight Bearing Restrictions: No     Mobility  Bed Mobility Overal bed mobility: Needs Assistance Bed Mobility: Sit to Supine       Sit to supine: Supervision   General bed mobility comments: Supervision for safety, no physical assist required    Transfers Overall transfer level: Needs assistance Equipment used: Rolling walker (2 wheels) Transfers: Sit to/from Stand, Bed to chair/wheelchair/BSC Sit to Stand: Min guard           General transfer comment: Min guard, no physical assist required. Pt flexed forward in stance with cuing able to stand up fully. RN provided pericare and then pt demonstrated ability to perform the same with min guard for safety during standing.    Ambulation/Gait Ambulation/Gait assistance: Min guard, +2 safety/equipment Gait Distance (Feet): 30 Feet Assistive device: Rolling walker (2 wheels) Gait Pattern/deviations: Step-through pattern, Decreased stride length Gait velocity: decreased     General Gait Details: Pt ambulated with RW and min guard, no physical assist required or overt LOB noted. Pt required two seated rest breaks so ambulation distances were 8+10+12. Dmeonstrated step through pattern with heavy use of BUE and distance limited by fatigue. Pt on RA with SpO2 86-94% but without good pleth, rapid recovery with pursed lip breathing. HR 81-120 at peak of activity.   Stairs             Wheelchair Mobility    Modified Rankin (Stroke Patients Only)       Balance Overall balance assessment: Needs assistance Sitting-balance support: Feet supported Sitting balance-Leahy Scale: Good     Standing balance support: During functional activity, Bilateral upper extremity supported, Reliant on assistive device for balance Standing balance-Leahy Scale: Fair  Cognition Arousal/Alertness: Awake/alert Behavior During Therapy: WFL for tasks  assessed/performed Overall Cognitive Status: Within Functional Limits for tasks assessed                                          Exercises      General Comments        Pertinent Vitals/Pain Pain Assessment Pain Assessment: No/denies pain    Home Living                          Prior Function            PT Goals (current goals can now be found in the care plan section) Acute Rehab PT Goals Patient Stated Goal: return home PT Goal Formulation: With patient Time For Goal Achievement: 08/30/22 Potential to Achieve Goals: Good Progress towards PT goals: Progressing toward goals    Frequency    Min 3X/week      PT Plan Current plan remains appropriate    Co-evaluation   Reason for Co-Treatment: For patient/therapist safety PT goals addressed during session: Mobility/safety with mobility OT goals addressed during session: ADL's and self-care      AM-PAC PT "6 Clicks" Mobility   Outcome Measure  Help needed turning from your back to your side while in a flat bed without using bedrails?: A Little Help needed moving from lying on your back to sitting on the side of a flat bed without using bedrails?: A Little Help needed moving to and from a bed to a chair (including a wheelchair)?: A Little Help needed standing up from a chair using your arms (e.g., wheelchair or bedside chair)?: A Little Help needed to walk in hospital room?: A Little Help needed climbing 3-5 steps with a railing? : Total 6 Click Score: 16    End of Session Equipment Utilized During Treatment: Gait belt (Pt on O2 at entry and exit but on RA for session) Activity Tolerance: Patient limited by fatigue Patient left: with call bell/phone within reach;in chair;with chair alarm set Nurse Communication: Mobility status PT Visit Diagnosis: Other abnormalities of gait and mobility (R26.89);Muscle weakness (generalized) (M62.81);Difficulty in walking, not elsewhere classified  (R26.2)     Time: 0258-5277 PT Time Calculation (min) (ACUTE ONLY): 24 min  Charges:  $Gait Training: 8-22 mins                     Coolidge Breeze, PT, DPT [facility] Department Office: [phone] Weekend pager: [phone]   Coolidge Breeze 08/24/2022, 1:44 PM

## 2022-08-25 ENCOUNTER — Institutional Professional Consult (permissible substitution): Payer: Medicare Other | Admitting: Pulmonary Disease

## 2022-08-25 DIAGNOSIS — J180 Bronchopneumonia, unspecified organism: Secondary | ICD-10-CM | POA: Diagnosis not present

## 2022-08-25 LAB — BASIC METABOLIC PANEL
Anion gap: 9 (ref 5–15)
BUN: 26 mg/dL — ABNORMAL HIGH (ref 8–23)
CO2: 24 mmol/L (ref 22–32)
Calcium: 7.7 mg/dL — ABNORMAL LOW (ref 8.9–10.3)
Chloride: 102 mmol/L (ref 98–111)
Creatinine, Ser: 1.29 mg/dL — ABNORMAL HIGH (ref 0.44–1.00)
GFR, Estimated: 42 mL/min — ABNORMAL LOW (ref >60)
Glucose, Bld: 70 mg/dL (ref 70–99)
Potassium: 3.8 mmol/L (ref 3.5–5.1)
Sodium: 135 mmol/L (ref 135–145)

## 2022-08-25 LAB — GLUCOSE, CAPILLARY: Glucose-Capillary: 77 mg/dL (ref 70–99)

## 2022-08-25 MED ORDER — MAGNESIUM OXIDE -MG SUPPLEMENT 400 (240 MG) MG PO TABS: 400.0000 mg | ORAL_TABLET | Freq: Every day | ORAL | 0 refills | Status: AC

## 2022-08-25 MED ORDER — AMIODARONE HCL 400 MG PO TABS: 400.0000 mg | ORAL_TABLET | Freq: Two times a day (BID) | ORAL | 0 refills | Status: AC

## 2022-08-25 MED ORDER — FUROSEMIDE 40 MG PO TABS: 40.0000 mg | ORAL_TABLET | Freq: Every day | ORAL | 0 refills | Status: AC

## 2022-08-25 MED ORDER — INSULIN GLARGINE-YFGN 100 UNIT/ML ~~LOC~~ SOLN
10.0000 [IU] | Freq: Every morning | SUBCUTANEOUS | Status: DC
  Filled 2022-08-25: qty 0.1

## 2022-08-25 MED ORDER — SACUBITRIL-VALSARTAN 24-26 MG PO TABS: 1.0000 | ORAL_TABLET | Freq: Two times a day (BID) | ORAL | 0 refills | Status: AC

## 2022-08-25 MED ORDER — INSULIN ASPART 100 UNIT/ML IJ SOLN: 4.0000 [IU] | Freq: Three times a day (TID) | INTRAMUSCULAR | Status: DC

## 2022-08-25 MED ORDER — AMIODARONE HCL 200 MG PO TABS: 200.0000 mg | ORAL_TABLET | Freq: Every day | ORAL | 0 refills | Status: AC

## 2022-08-25 MED ORDER — EMPAGLIFLOZIN 10 MG PO TABS: 10.0000 mg | ORAL_TABLET | Freq: Every day | ORAL | 0 refills | Status: AC

## 2022-08-25 MED ORDER — SPIRONOLACTONE 25 MG PO TABS: 25.0000 mg | ORAL_TABLET | Freq: Every day | ORAL | 0 refills | Status: AC

## 2022-08-25 MED ORDER — AMIODARONE HCL 200 MG PO TABS: 200.0000 mg | ORAL_TABLET | Freq: Two times a day (BID) | ORAL | 0 refills | Status: AC

## 2022-08-25 MED ORDER — CARVEDILOL 25 MG PO TABS: 25.0000 mg | ORAL_TABLET | Freq: Two times a day (BID) | ORAL | 0 refills | Status: AC

## 2022-08-25 NOTE — Discharge Summary (Signed)
Physician Discharge Summary  Debra Ryan BTD:[id number] DOB: [birth year] DOA: 08/14/2022  PCP: No primary care provider on file.  Admit date: 08/14/2022 Discharge date: 08/25/2022  Admitted From: Home Disposition: Home with home health therapies  Recommendations for Outpatient Follow-up:  Follow up with PCP in 1-2 weeks Please obtain BMP/CBC in one week 3.  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  Patient was recently admitted to the hospital for pneumonia and decompensated CHF, hypoglycemia and UTI.  She had gone home from a SNF 2 days ago only.  In the emergency room, afebrile, hypoxemic 89% on room air, 2 L for compensation that is her baseline.  COVID-19 negative.  Chest x-ray with bronchial wall thickening with mild patchy opacities lung bases, bilateral interstitial thickening.  During hospitalization she had developed respiratory distress needing BiPAP, also developed rapid A-fib.  Followed by cardiology.  Improved and going home with home health therapies.     Assessment & Plan:   Bronchopneumonia: Chest x-ray consistent with bronchitis/bronchopneumonia.  No evidence of sepsis.  WBC count normal. COVID 19 influenza negative.  Procalcitonin less than 0.1.  Initially on IV vancomycin and cefepime and now symptomatically treated.  Completed antibiotics.  Blood cultures negative.   Oxygenation improving. Continue bronchodilators, diuretics at home.  Taper off steroids.  Completed antibiotic therapies.   Acute hypoxemic respiratory failure with respiratory distress: Needing BiPAP.  Developed respiratory failure needing 5 L of oxygen by nasal cannula.  Currently improved and using CPAP at night that she uses at home.   Stabilized back to her baseline.   Paroxysmal A-fib with RVR: Rate controlled currently on Coreg, loading dose of amiodarone.  2D echocardiogram with decompressed ejection fraction.   Coreg 25 mg twice daily.  Amiodarone 400 mg daily and slow taper to 200 mg daily as prescribed.  Cardiology will schedule follow-up.  She is therapeutic on Xarelto.     Acute on chronic diastolic heart failure: New EF with ejection fraction 35 to 40%.  Treated with IV Lasix.  5.5 L negative balance.  Changing to oral Lasix.  Already on Entresto, Jardiance, Coreg, aspirin, spironolactone and Lipitor. Compression stockings on the legs.  Well compensated now.  Cardiology will schedule follow-up.   CKD stage IIIa: Creatinine remains at about baseline.  Monitor closely after discharge.   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  Outpatient oncology follow-up.   Type 1 diabetes with hyperglycemia: Patient on insulin pump at home.  Currently remains on subcu insulin while hospitalized.  Will resume insulin pump on discharge.  She had low blood sugars overnight but now eating well.  Was advised to skip morning doses of insulin and start her insulin pump after eating lunch at home.   Chronic pain syndrome: Managed with oxycodone.   Obstructive sleep apnea: Wearing CPAP at night.  Continue.  Chronically ill.  Medically stable to discharge home with family.  She will benefit with PT/OT/home health nurse.   Discharge Diagnoses:  Principal Problem:   Bronchopneumonia Active  Problems:   Acute respiratory failure with hypoxia (HCC)   Acute on  chronic diastolic CHF (congestive heart failure) (HCC)   Normocytic anemia   Type 1 diabetes (HCC)   Chronic pain   OSA (obstructive sleep apnea)   CKD (chronic kidney disease) stage 3, GFR 30-59 ml/min (HCC)   Paroxysmal atrial fibrillation (HCC)   CAD (coronary artery disease)   HTN (hypertension)   HLD (hyperlipidemia)   Hypothyroidism    Discharge Instructions  Discharge Instructions     Diet - low sodium heart healthy   Complete by: As directed    Diet Carb Modified   Complete by: As directed    Increase activity slowly   Complete by: As directed       Allergies as of 08/25/2022       Reactions   Sulfur Rash, Hives   Clindamycin/lincomycin Nausea And Vomiting   Sulfa Antibiotics Hives        Medication List     STOP taking these medications    lisinopril 20 MG tablet Commonly known as: ZESTRIL       TAKE these medications    amiodarone 400 MG tablet Commonly known as: PACERONE Take 1 tablet (400 mg total) by mouth 2 (two) times daily for 4 days.   amiodarone 200 MG tablet Commonly known as: PACERONE Take 1 tablet (200 mg total) by mouth 2 (two) times daily for 7 days. Start taking on: August 30, 2022   amiodarone 200 MG tablet Commonly known as: PACERONE Take 1 tablet (200 mg total) by mouth daily. Start taking on: September 06, 2022   Asacol HD 800 MG Tbec Generic drug: Mesalamine Take 1 tablet by mouth daily.   aspirin 81 MG tablet Take 81 mg by mouth daily.   atorvastatin 40 MG tablet Commonly known as: LIPITOR Take 40 mg by mouth daily.   carvedilol 25 MG tablet Commonly known as: Coreg Take 1 tablet (25 mg total) by mouth in the morning and at bedtime. What changed:  medication strength how much to take   DRY EYE RELIEF OP Place 1-2 drops into both eyes as needed (Dry eyes).   empagliflozin 10 MG Tabs tablet Commonly known as: JARDIANCE Take 1 tablet (10 mg total) by mouth daily.   furosemide 40 MG tablet Commonly  known as: LASIX Take 1 tablet (40 mg total) by mouth daily. What changed:  medication strength how much to take   Insulin Aspart FlexPen 100 UNIT/ML Commonly known as: NOVOLOG Inject 29 Units into the skin daily. Uses insulin pump   levothyroxine 200 MCG tablet Commonly known as: SYNTHROID Take 200 mcg by mouth daily before breakfast.   magnesium oxide 400 (240 Mg) MG tablet Commonly known as: MAG-OX Take 1 tablet (400 mg total) by mouth daily.   MULTIVITAMIN PO Take 1 tablet by mouth daily.   nitroGLYCERIN 0.4 MG SL tablet Commonly known as: NITROSTAT Place 0.4 mg under the tongue every 5 (five) minutes as needed for chest pain.   omeprazole 40 MG capsule Commonly known as: PRILOSEC Take 40 mg by mouth daily.   oxyCODONE 15 MG immediate release tablet Commonly known as: ROXICODONE Take 15 mg by mouth every 4 (four) hours as needed for pain.   oxyCODONE 15 mg 12 hr tablet Commonly known as: OXYCONTIN Take 15 mg by mouth every 12 (twelve) hours.   Rivaroxaban 15 MG Tabs tablet Commonly known as: XARELTO Take 15 mg by mouth daily with supper.   sacubitril-valsartan 24-26  MG Commonly known as: ENTRESTO Take 1 tablet by mouth 2 (two) times daily.   spironolactone 25 MG tablet Commonly known as: ALDACTONE Take 1 tablet (25 mg total) by mouth daily.   VITAMIN C PO Take 1 tablet by mouth daily.   vitamin E 45 MG (100 UNITS) capsule Take 100 Units by mouth daily.        Allergies  Allergen Reactions   Sulfur Rash and Hives   Clindamycin/Lincomycin Nausea And Vomiting   Sulfa Antibiotics Hives    Consultations: Cardiology   Procedures/Studies: ECHOCARDIOGRAM LIMITED  Result Date: 08/21/2022    ECHOCARDIOGRAM LIMITED REPORT   Patient Name:   Debra Ryan Date of Exam: 08/21/2022 Medical Rec #:  [id number]           Height:       62.0 in Accession #:    [id number]          Weight:       232.8 lb Date of Birth:  1941-03-25           BSA:           2.039 m Patient Age:    15 years            BP:           130/84 mmHg Patient Gender: F                   HR:           85 bpm. Exam Location:  Inpatient Procedure: Limited Echo, Cardiac Doppler and Color Doppler Indications:    Pericardial effusion  History:        Patient has prior history of Echocardiogram examinations, most                 recent 08/22/2022.  Sonographer:    Harvie Junior Referring Phys: [id number] Alphonse Guild BRANCH  Sonographer Comments: Technically difficult study due to poor echo windows. Image acquisition challenging due to patient body habitus. IMPRESSIONS  1. Left ventricular ejection fraction, by estimation, is 35%. Left ventricular endocardial border not optimally defined to evaluate regional wall motion.  2. Moderate pericardial effusion primarily surrounding the LV. There is no evidence of tamponade physiology. Stable from 08/19/22 study.  3. The inferior vena cava is normal in size with greater than 50% respiratory variability, suggesting right atrial pressure of 3 mmHg.  4. Limited echo to evaluate pericardial effusion. FINDINGS  Left Ventricle: Left ventricular ejection fraction, by estimation, is 35%. Left ventricular endocardial border not optimally defined to evaluate regional wall motion. Pericardium: Moderate pericardial effusion primarily surrounding the LV. There is no evidence of tamponade physiology. A moderately sized pericardial effusion is present. There is no evidence of cardiac tamponade. Venous: The inferior vena cava is normal in size with greater than 50% respiratory variability, suggesting right atrial pressure of 3 mmHg. Additional Comments: There is a moderate pleural effusion in the left lateral region. LEFT VENTRICLE PLAX 2D LVIDd:         5.10 cm      Diastology LVIDs:         4.20 cm      LV e' medial:    7.72 cm/s LV PW:         1.00 cm      LV E/e' medial:  14.2 LV IVS:        1.00 cm      LV e' lateral:   8.38 cm/s LVOT diam:  2.00 cm      LV E/e' lateral:  13.1 LVOT Area:     3.14 cm  LV Volumes (MOD) LV vol d, MOD A2C: 171.0 ml LV vol d, MOD A4C: 107.0 ml LV vol s, MOD A2C: 115.0 ml LV vol s, MOD A4C: 66.2 ml LV SV MOD A2C:     56.0 ml LV SV MOD A4C:     107.0 ml LV SV MOD BP:      53.9 ml RIGHT VENTRICLE RV S prime:     10.40 cm/s MITRAL VALVE MV Area (PHT): 4.94 cm       SHUNTS MV Decel Time: 154 msec       Systemic Diam: 2.00 cm MR Peak grad:    114.3 mmHg MR Vmax:         534.67 cm/s MR PISA:         3.08 cm MR PISA Eff ROA: 16 mm MR PISA Radius:  0.70 cm MV E velocity: 110.00 cm/s MV A velocity: 37.50 cm/s MV E/A ratio:  2.93 Carlyle Dolly MD Electronically signed by Carlyle Dolly MD Signature Date/Time: 08/21/2022/12:56:56 PM    Final    ECHOCARDIOGRAM COMPLETE  Result Date: 08/19/2022    ECHOCARDIOGRAM REPORT   Patient Name:   Debra Ryan Date of Exam: 08/19/2022 Medical Rec #:  [id number]           Height:       62.0 in Accession #:    [id number]          Weight:       226.9 lb Date of Birth:  12-24-1940           BSA:          2.017 m Patient Age:    58 years            BP:           150/47 mmHg Patient Gender: F                   HR:           80 bpm. Exam Location:  Inpatient Procedure: 2D Echo, Cardiac Doppler and Color Doppler Indications:    CHF  History:        Patient has no prior history of Echocardiogram examinations.                 CHF, CAD; Risk Factors:Diabetes, Hypertension and Dyslipidemia.  Sonographer:    Danne Baxter RDCS, FE, PE Referring Phys: [phone] [place]  1. Hypokinesis of the mid portion of the LV; akinesis of the distal anterior, distal inferior and apical walls. . Left ventricular ejection fraction, by estimation, is 35 to 40%. The left ventricle has moderately decreased function. There is mild left ventricular hypertrophy. Left ventricular diastolic parameters are indeterminate.  2. Right ventricular systolic function is normal. The right ventricular size is mildly enlarged.  3. Left atrial  size was severely dilated.  4. Right atrial size was mildly dilated.  5. Large pericardial effusion. The pericardial effusion is circumferential. Large pleural effusion.  6. Mild to moderate mitral valve regurgitation.  7. The aortic valve is tricuspid. Aortic valve regurgitation is not visualized. Aortic valve sclerosis/calcification is present, without any evidence of aortic stenosis.  8. The inferior vena cava is dilated in size with <50% respiratory variability, suggesting right atrial pressure of 15 mmHg. FINDINGS  Left Ventricle: Hypokinesis of the mid portion of the LV; akinesis  of the distal anterior, distal inferior and apical walls. Left ventricular ejection fraction, by estimation, is 35 to 40%. The left ventricle has moderately decreased function. The left ventricular internal cavity size was normal in size. There is mild left ventricular hypertrophy. Left ventricular diastolic parameters are indeterminate. Right Ventricle: The right ventricular size is mildly enlarged. Right vetricular wall thickness was not assessed. Right ventricular systolic function is normal. Left Atrium: Left atrial size was severely dilated. Right Atrium: Right atrial size was mildly dilated. Pericardium: A large pericardial effusion is present. The pericardial effusion is circumferential. Mitral Valve: There is mild thickening of the mitral valve leaflet(s). Mild to moderate mitral valve regurgitation. Tricuspid Valve: The tricuspid valve is normal in structure. Tricuspid valve regurgitation is mild. Aortic Valve: The aortic valve is tricuspid. Aortic valve regurgitation is not visualized. Aortic valve sclerosis/calcification is present, without any evidence of aortic stenosis. Pulmonic Valve: The pulmonic valve was normal in structure. Pulmonic valve regurgitation is not visualized. Aorta: The aortic root and ascending aorta are structurally normal, with no evidence of dilitation. Venous: The inferior vena cava is dilated in  size with less than 50% respiratory variability, suggesting right atrial pressure of 15 mmHg. IAS/Shunts: No atrial level shunt detected by color flow Doppler. Additional Comments: There is a large pleural effusion.  LEFT VENTRICLE PLAX 2D LVIDd:         4.90 cm LVIDs:         3.90 cm LV PW:         1.30 cm LV IVS:        0.90 cm LVOT diam:     2.30 cm LV SV:         57 LV SV Index:   28 LVOT Area:     4.15 cm  RIGHT VENTRICLE RV S prime:     9.79 cm/s TAPSE (M-mode): 1.4 cm LEFT ATRIUM              Index        RIGHT ATRIUM           Index LA Vol (A2C):   113.0 ml 56.03 ml/m  RA Area:     22.90 cm LA Vol (A4C):   146.0 ml 72.39 ml/m  RA Volume:   68.30 ml  33.86 ml/m LA Biplane Vol: 138.0 ml 68.42 ml/m  AORTIC VALVE LVOT Vmax:   75.50 cm/s LVOT Vmean:  54.700 cm/s LVOT VTI:    0.138 m  AORTA Ao Root diam: 3.40 cm Ao Asc diam:  3.50 cm TRICUSPID VALVE TR Peak grad:   21.5 mmHg TR Vmax:        232.00 cm/s  SHUNTS Systemic VTI:  0.14 m Systemic Diam: 2.30 cm Dorris Carnes MD Electronically signed by Dorris Carnes MD Signature Date/Time: 08/19/2022/6:14:36 PM    Final    DG CHEST PORT 1 VIEW  Result Date: 08/18/2022 CLINICAL DATA:  Wheezing. EXAM: PORTABLE CHEST 1 VIEW COMPARISON:  August 14, 2022. FINDINGS: Stable cardiomegaly. Mildly increased bilateral interstitial densities are noted concerning for worsening edema or possibly pneumonia, right greater than left. Bony thorax is unremarkable. IMPRESSION: Mildly increased bilateral interstitial densities are noted concerning for worsening edema or possibly pneumonia. Electronically Signed   By: Marijo Conception M.D.   On: 08/18/2022 15:11   DG Chest 2 View  Result Date: 08/14/2022 CLINICAL DATA:  Cough. EXAM: CHEST - 2 VIEW COMPARISON:  07/24/2022. FINDINGS: Cardiac silhouette is enlarged, but stable. No mediastinal or hilar masses.  No convincing adenopathy. Mild bilateral interstitial thickening most evident in the lower lungs. Lower lobe bronchial wall  thickening with mild patchy airspace opacities in the lower lobes. Subtle nodular opacities noted in upper lungs. No pleural effusion.  No pneumothorax. Skeletal structures are intact. IMPRESSION: 1. Bronchial wall thickening with mild patchy opacities in the lung bases suggesting bronchitis possible bronchopneumonia. 2. Bilateral interstitial thickening, less notable than on prior exams. Cannot exclude a mild component congestive heart failure. Small nodular opacities reflect nodules better seen on the CT from 07/22/2022. Electronically Signed   By: Lajean Manes M.D.   On: 08/14/2022 16:47   (Echo, Carotid, EGD, Colonoscopy, ERCP)    Subjective: Patient seen in the morning rounds.  No overnight events except her blood sugars were low and this was probably triggered by getting insulin and tapering of the prednisone.  Currently feels better.  Eager to go home.  On room air.   Discharge Exam: Vitals:   08/25/22 0307 08/25/22 0400  BP:  (!) 111/57  Pulse:  89  Resp:  16  Temp: (!) 97.5 F (36.4 C)   SpO2:  98%   Vitals:   08/25/22 0000 08/25/22 0307 08/25/22 0400 08/25/22 0500  BP: 128/61  (!) 111/57   Pulse: 68  89   Resp: 13  16   Temp:  (!) 97.5 F (36.4 C)    TempSrc:  Axillary    SpO2: 93%  98%   Weight:    104.6 kg  Height:        General: Pt is alert, awake, not in acute distress, frail.  Not in any distress.  Currently on room air. Cardiovascular: Rate irregular, S1/S2 +, no rubs, no gallops Respiratory: CTA bilaterally, no wheezing, no rhonchi Abdominal: Soft, NT, ND, bowel sounds + Extremities: 1+ bilateral edema wearing compression stockings, no cyanosis    The results of significant diagnostics from this hospitalization (including imaging, microbiology, ancillary and laboratory) are listed below for reference.     Microbiology: No results found for this or any previous visit (from the past 240 hour(s)).   Labs: BNP (last 3 results) Recent Labs     08/14/22 1643 08/16/22 0533 08/21/22 0323  BNP 470.5* 469.4* 426.8*   Basic Metabolic Panel: Recent Labs  Lab 08/19/22 0854 08/20/22 0322 08/21/22 0323 08/22/22 0728 08/23/22 1036 08/24/22 0254 08/25/22 0301  NA 133* 135 135 133* 136 136 135  K 4.0 3.7 4.2 3.5 3.5 3.6 3.8  CL 98 99 99 100 100 100 102  CO2 '26 28 25 23 28 29 24  '$ GLUCOSE 343* 266* 313* 250* 204* 171* 70  BUN 15 16 27* 27* 26* 28* 26*  CREATININE 1.14* 1.10* 1.26* 1.11* 1.10* 1.22* 1.29*  CALCIUM 8.6* 8.7* 8.7* 8.3* 7.9* 8.0* 7.7*  MG 1.6* 2.0 2.1 2.2  --   --   --    Liver Function Tests: No results for input(s): "AST", "ALT", "ALKPHOS", "BILITOT", "PROT", "ALBUMIN" in the last 168 hours. No results for input(s): "LIPASE", "AMYLASE" in the last 168 hours. No results for input(s): "AMMONIA" in the last 168 hours. CBC: Recent Labs  Lab 08/19/22 0854 08/20/22 0322 08/21/22 0323 08/22/22 0728  WBC 6.3 11.5* 13.4* 13.0*  HGB 12.0 11.3* 11.2* 12.8  HCT 37.9 34.6* 34.8* 40.1  MCV 97.4 94.3 93.8 95.9  PLT 189 181 189 168   Cardiac Enzymes: No results for input(s): "CKTOTAL", "CKMB", "CKMBINDEX", "TROPONINI" in the last 168 hours. BNP: Invalid input(s): "POCBNP" CBG: Recent Labs  Lab 08/24/22 0712 08/24/22 1149 08/24/22 1711 08/24/22 2120 08/25/22 0259  GLUCAP 147* 138* 131* 119* 77   D-Dimer No results for input(s): "DDIMER" in the last 72 hours. Hgb A1c No results for input(s): "HGBA1C" in the last 72 hours. Lipid Profile No results for input(s): "CHOL", "HDL", "LDLCALC", "TRIG", "CHOLHDL", "LDLDIRECT" in the last 72 hours. Thyroid function studies No results for input(s): "TSH", "T4TOTAL", "T3FREE", "THYROIDAB" in the last 72 hours.  Invalid input(s): "FREET3" Anemia work up No results for input(s): "VITAMINB12", "FOLATE", "FERRITIN", "TIBC", "IRON", "RETICCTPCT" in the last 72 hours. Urinalysis    Component Value Date/Time   COLORURINE YELLOW 08/09/2019 2008   APPEARANCEUR HAZY (A)  08/09/2019 2008   LABSPEC >1.030 (H) 08/09/2019 2008   PHURINE 5.5 08/09/2019 2008   GLUCOSEU NEGATIVE 08/09/2019 2008   HGBUR NEGATIVE 08/09/2019 2008   BILIRUBINUR SMALL (A) 08/09/2019 2008   KETONESUR 15 (A) 08/09/2019 2008   PROTEINUR 100 (A) 08/09/2019 2008   UROBILINOGEN 0.2 10/05/2013 1227   NITRITE NEGATIVE 08/09/2019 2008   LEUKOCYTESUR NEGATIVE 08/09/2019 2008   Sepsis Labs Recent Labs  Lab 08/19/22 0854 08/20/22 0322 08/21/22 0323 08/22/22 0728  WBC 6.3 11.5* 13.4* 13.0*   Microbiology No results found for this or any previous visit (from the past 240 hour(s)).   Time coordinating discharge:  35 minutes  SIGNED:   Barb Merino, MD  [facility] 08/25/2022, 8:23 AM

## 2022-08-25 NOTE — Progress Notes (Signed)
Patient's CBG was 77, gave patient 118 mL apple juice and patient ate a piece of candy.

## 2022-08-26 LAB — GLUCOSE, CAPILLARY: Glucose-Capillary: 90 mg/dL (ref 70–99)

## 2022-11-18 ENCOUNTER — Inpatient Hospital Stay: Admission: RE | Admit: 2022-11-18 | Discharge: 2022-12-30 | Disposition: E | Payer: Medicare Other

## 2022-11-18 DIAGNOSIS — I5043 Acute on chronic combined systolic (congestive) and diastolic (congestive) heart failure: Secondary | ICD-10-CM | POA: Diagnosis present

## 2022-11-18 DIAGNOSIS — G894 Chronic pain syndrome: Secondary | ICD-10-CM | POA: Diagnosis present

## 2022-11-18 DIAGNOSIS — J9621 Acute and chronic respiratory failure with hypoxia: Secondary | ICD-10-CM | POA: Diagnosis present

## 2022-11-18 DIAGNOSIS — I482 Chronic atrial fibrillation, unspecified: Secondary | ICD-10-CM | POA: Insufficient documentation

## 2022-11-18 DIAGNOSIS — J180 Bronchopneumonia, unspecified organism: Secondary | ICD-10-CM | POA: Diagnosis present

## 2022-11-19 ENCOUNTER — Other Ambulatory Visit (HOSPITAL_COMMUNITY): Payer: Medicare Other

## 2022-11-19 LAB — BRAIN NATRIURETIC PEPTIDE: B Natriuretic Peptide: 512.4 pg/mL — ABNORMAL HIGH (ref 0.0–100.0)

## 2022-11-19 LAB — CBC
HCT: 32.8 % — ABNORMAL LOW (ref 36.0–46.0)
Hemoglobin: 10.6 g/dL — ABNORMAL LOW (ref 12.0–15.0)
MCH: 31.7 pg (ref 26.0–34.0)
MCHC: 32.3 g/dL (ref 30.0–36.0)
MCV: 98.2 fL (ref 80.0–100.0)
Platelets: 180 10*3/uL (ref 150–400)
RBC: 3.34 MIL/uL — ABNORMAL LOW (ref 3.87–5.11)
RDW: 15.3 % (ref 11.5–15.5)
WBC: 5.9 10*3/uL (ref 4.0–10.5)
nRBC: 0 % (ref 0.0–0.2)

## 2022-11-19 LAB — COMPREHENSIVE METABOLIC PANEL
ALT: 12 U/L (ref 0–44)
AST: 19 U/L (ref 15–41)
Albumin: 2.7 g/dL — ABNORMAL LOW (ref 3.5–5.0)
Alkaline Phosphatase: 78 U/L (ref 38–126)
Anion gap: 9 (ref 5–15)
BUN: 31 mg/dL — ABNORMAL HIGH (ref 8–23)
CO2: 24 mmol/L (ref 22–32)
Calcium: 8.9 mg/dL (ref 8.9–10.3)
Chloride: 104 mmol/L (ref 98–111)
Creatinine, Ser: 1.72 mg/dL — ABNORMAL HIGH (ref 0.44–1.00)
GFR, Estimated: 30 mL/min — ABNORMAL LOW (ref >60)
Glucose, Bld: 260 mg/dL — ABNORMAL HIGH (ref 70–99)
Potassium: 4.4 mmol/L (ref 3.5–5.1)
Sodium: 137 mmol/L (ref 135–145)
Total Bilirubin: 0.6 mg/dL (ref 0.3–1.2)
Total Protein: 5.5 g/dL — ABNORMAL LOW (ref 6.5–8.1)

## 2022-11-19 LAB — BLOOD GAS, ARTERIAL
Acid-base deficit: 1.4 mmol/L (ref 0.0–2.0)
Bicarbonate: 27.4 mmol/L (ref 20.0–28.0)
O2 Saturation: 82 %
Patient temperature: 36
pCO2 arterial: 61 mmHg — ABNORMAL HIGH (ref 32–48)
pH, Arterial: 7.25 — ABNORMAL LOW (ref 7.35–7.45)
pO2, Arterial: 48 mmHg — ABNORMAL LOW (ref 83–108)

## 2022-11-19 LAB — PROTIME-INR
INR: 3.7 — ABNORMAL HIGH (ref 0.8–1.2)
Prothrombin Time: 36.6 seconds — ABNORMAL HIGH (ref 11.4–15.2)

## 2022-11-19 LAB — T4, FREE: Free T4: 1.32 ng/dL — ABNORMAL HIGH (ref 0.61–1.12)

## 2022-11-19 LAB — TSH: TSH: 5.161 u[IU]/mL — ABNORMAL HIGH (ref 0.350–4.500)

## 2022-11-19 LAB — APTT: aPTT: 43 seconds — ABNORMAL HIGH (ref 24–36)

## 2022-11-20 DIAGNOSIS — I482 Chronic atrial fibrillation, unspecified: Secondary | ICD-10-CM | POA: Insufficient documentation

## 2022-11-20 DIAGNOSIS — J180 Bronchopneumonia, unspecified organism: Secondary | ICD-10-CM | POA: Diagnosis not present

## 2022-11-20 DIAGNOSIS — J9621 Acute and chronic respiratory failure with hypoxia: Secondary | ICD-10-CM | POA: Diagnosis not present

## 2022-11-20 DIAGNOSIS — I5043 Acute on chronic combined systolic (congestive) and diastolic (congestive) heart failure: Secondary | ICD-10-CM | POA: Diagnosis not present

## 2022-11-20 LAB — T4, FREE: Free T4: 1.06 ng/dL (ref 0.61–1.12)

## 2022-11-20 LAB — BLOOD GAS, ARTERIAL
Acid-base deficit: 0.9 mmol/L (ref 0.0–2.0)
Bicarbonate: 25.8 mmol/L (ref 20.0–28.0)
O2 Saturation: 97.5 %
Patient temperature: 36
pCO2 arterial: 48 mmHg (ref 32–48)
pH, Arterial: 7.33 — ABNORMAL LOW (ref 7.35–7.45)
pO2, Arterial: 101 mmHg (ref 83–108)

## 2022-11-20 LAB — BRAIN NATRIURETIC PEPTIDE: B Natriuretic Peptide: 524.6 pg/mL — ABNORMAL HIGH (ref 0.0–100.0)

## 2022-11-20 LAB — BASIC METABOLIC PANEL
Anion gap: 9 (ref 5–15)
BUN: 28 mg/dL — ABNORMAL HIGH (ref 8–23)
CO2: 23 mmol/L (ref 22–32)
Calcium: 8.5 mg/dL — ABNORMAL LOW (ref 8.9–10.3)
Chloride: 103 mmol/L (ref 98–111)
Creatinine, Ser: 1.66 mg/dL — ABNORMAL HIGH (ref 0.44–1.00)
GFR, Estimated: 31 mL/min — ABNORMAL LOW (ref >60)
Glucose, Bld: 256 mg/dL — ABNORMAL HIGH (ref 70–99)
Potassium: 4.2 mmol/L (ref 3.5–5.1)
Sodium: 135 mmol/L (ref 135–145)

## 2022-11-20 LAB — TSH: TSH: 3.975 u[IU]/mL (ref 0.350–4.500)

## 2022-11-20 NOTE — Consult Note (Signed)
P[facility]  PULMONARY SERVICE  Date of Service: 11/20/2022  PULMONARY CRITICAL CARE CONSULT   Debra Ryan  WUJ:[id number]  DOB: 14-Mar-1941   DOA: 11/19/2022  Referring Physician: Satira Sark, MD  HPI: Debra Ryan is a 82 y.o. female seen for follow up of Acute on Chronic Respiratory Failure.  She has multiple medical problems including chronic pain syndrome atrial fibrillation chronic arthritis GERD DVT hyperlipidemia diabetes mellitus who presented with increasing shortness of breath cough and encephalopathy hypersomnia and lethargy.  Patient at the time of initial evaluation showed an elevated BNP along with cardiomegaly and pulmonary vascular congestion.  Patient was started on aggressive IV diuresis to include Lasix.  Patient did have significant edema which did improve.  Oxygen requirements however continue to require her to have her stay on the BiPAP.  Patient had follow-up x-rays done which showed slight improved aeration of the lung but she still had significant infiltrates to require increased oxygen.  She is transferred to our facility for further management and weaning from oxygen.  Currently she is on BiPAP  Review of Systems:  ROS performed and is unremarkable other than noted above.  Past Medical History:  Diagnosis Date   Arthritis    Atrial fibrillation (HCC)    Chronic pain    takes Opana daily and Percocet daily as needed   Colitis    Constipation    takes Carafate and Miralax daily   Coronary artery disease    Diabetes mellitus    has an Insulin Pump   DVT (deep venous thrombosis) (HCC)    Gastroparesis    GERD (gastroesophageal reflux disease)    High cholesterol    takes Crestor daily   History of blood clots    takes Xarelto and Brilinta daily   Hypertension    takes Coreg and Lisinopril daily   Hypothyroidism    takes Synthroid daily   MI (myocardial infarction) ([place])    Muscle  spasm    takes Tizanidine daily   Neuropathy    Pulmonary embolism (HCC)     Past Surgical History:  Procedure Laterality Date   ABDOMINAL HYSTERECTOMY     ANKLE SURGERY     cardiac stents     CARDIAC SURGERY     CARDIAC SURGERY     cateract surgery     JOINT REPLACEMENT     bilateral knees   TONSILLECTOMY     WRIST SURGERY      Social History:    reports that she quit smoking about 52 years ago. Her smoking use included cigarettes. She has a 7.50 pack-year smoking history. She has never used smokeless tobacco. She reports that she does not drink alcohol and does not use drugs.  Family History: Non-Contributory to the present illness  Allergies  Allergen Reactions   Sulfur Rash and Hives   Clindamycin/Lincomycin Nausea And Vomiting   Sulfa Antibiotics Hives    Medications: Reviewed on Rounds  Physical Exam:  Vitals: Temperature 97.2 pulse 64 respiratory rate 12 blood pressure 145/84 saturation is 100%  Ventilator Settings BiPAP FiO2 40% pressure 14/6 and tidal volumes were about 405  General: Comfortable at this time Eyes: Grossly normal lids, irises & conjunctiva ENT: grossly tongue is normal Neck: no obvious mass Cardiovascular: S1-S2 normal no gallop or rub is noted Respiratory: Coarse breath sounds with crackles noted Abdomen: Soft otherwise nontender Skin: no rash seen on limited exam Musculoskeletal: not rigid Psychiatric:unable to assess Neurologic:  no seizure no involuntary movements         Labs on Admission:  Basic Metabolic Panel: Recent Labs  Lab 11/19/22 1047 11/20/22 0143  NA 137 135  K 4.4 4.2  CL 104 103  CO2 24 23  GLUCOSE 260* 256*  BUN 31* 28*  CREATININE 1.72* 1.66*  CALCIUM 8.9 8.5*    Recent Labs  Lab 11/19/22 1245 11/20/22 0609  PHART 7.25* 7.33*  PCO2ART 61* 48  PO2ART 48* 101  HCO3 27.4 25.8  O2SAT 82 97.5    Liver Function Tests: Recent Labs  Lab 11/19/22 1047  AST 19  ALT 12  ALKPHOS 78  BILITOT 0.6   PROT 5.5*  ALBUMIN 2.7*   No results for input(s): "LIPASE", "AMYLASE" in the last 168 hours. No results for input(s): "AMMONIA" in the last 168 hours.  CBC: Recent Labs  Lab 11/19/22 0232  WBC 5.9  HGB 10.6*  HCT 32.8*  MCV 98.2  PLT 180    Cardiac Enzymes: No results for input(s): "CKTOTAL", "CKMB", "CKMBINDEX", "TROPONINI" in the last 168 hours.  BNP (last 3 results) Recent Labs    08/21/22 0323 11/19/22 1617 11/20/22 0143  BNP 461.9* 512.4* 524.6*    ProBNP (last 3 results) No results for input(s): "PROBNP" in the last 8760 hours.   Radiological Exams on Admission: DG Chest 1 View  Result Date: 11/19/2022 CLINICAL DATA:  Respiratory failure EXAM: CHEST  1 VIEW COMPARISON:  11/14/2022 CT chest FINDINGS: Bilateral interstitial and alveolar airspace opacities. Small bilateral pleural effusions. No pneumothorax. Stable cardiomegaly. No acute osseous abnormality. IMPRESSION: 1. Bilateral interstitial and alveolar airspace opacities which may reflect pulmonary edema versus multifocal pneumonia. Underlying metastatic disease is not well visualized and was better delineated on the CT of the chest dated 11/14/2022. Electronically Signed   By: Kathreen Devoid M.D.   On: 11/19/2022 09:03    Assessment/Plan Active Problems:   Acute on chronic respiratory failure with hypoxia (HCC)   Bronchopneumonia   Acute on chronic combined systolic and diastolic CHF (congestive heart failure) (HCC)   Chronic pain syndrome   Chronic atrial fibrillation with RVR (HCC)   Acute on chronic respiratory failure with hypoxia plan is going to be continue with trying to wean oxygen down she is currently on 40% FiO2 I would recommend considering switching over to high flow oxygen will discuss further with the respiratory therapist. Chronic heart failure not clear as to when the last echocardiogram was done if not recent would consider doing a follow-up.  The most recent chest x-ray shows bilateral  interstitial alveolar infiltrates consistent with pulmonary edema would need to continue ongoing diuresis. Chronic atrial fibrillation apparently patient did undergo an attempt at cardioversion.  Will continue to monitor on telemetry. Healthcare associated pneumonia patient apparently did receive Rocephin Zosyn and vancomycin.  I think the picture is more consistent with CHF at this time we will continue to monitor and follow along. Chronic pain syndrome pain management per primary care team  I have personally seen and evaluated the patient, evaluated laboratory and imaging results, formulated the assessment and plan and placed orders. The Patient requires high complexity decision making with multiple systems involvement.  Case was discussed on Rounds with the Respiratory Therapy Director and the Respiratory staff Time Spent 58mnutes  Jakylan Ron A Nashonda Limberg, MD FWest Marion Community HospitalPulmonary Critical Care Medicine Sleep Medicine

## 2022-11-21 ENCOUNTER — Other Ambulatory Visit (HOSPITAL_COMMUNITY): Payer: Medicare Other

## 2022-11-21 DIAGNOSIS — J180 Bronchopneumonia, unspecified organism: Secondary | ICD-10-CM | POA: Diagnosis not present

## 2022-11-21 DIAGNOSIS — I482 Chronic atrial fibrillation, unspecified: Secondary | ICD-10-CM | POA: Diagnosis not present

## 2022-11-21 DIAGNOSIS — J9621 Acute and chronic respiratory failure with hypoxia: Secondary | ICD-10-CM | POA: Diagnosis not present

## 2022-11-21 DIAGNOSIS — I5043 Acute on chronic combined systolic (congestive) and diastolic (congestive) heart failure: Secondary | ICD-10-CM | POA: Diagnosis not present

## 2022-11-21 NOTE — Progress Notes (Signed)
[name]  PROGRESS NOTE     Debra Ryan  [id number]:[id number]  DOB: [birth year]   DOA: 11/12/2022  Referring Physician: Satira Sark, MD  HPI: Debra Ryan is a 82 y.o. female being followed for ventilator/airway/oxygen weaning Acute on Chronic Respiratory Failure.  Patient has intermittently been going on nasal cannula with Oxymizer  Medications: Reviewed on Rounds  Physical Exam:  Vitals: Temperature is 96.9 pulse 64 respiratory rate is 13 blood pressure 166/76 saturation is 99%  Ventilator Settings on BiPAP  General: Comfortable at this time Neck: supple Cardiovascular: no malignant arrhythmias Respiratory: Scattered rhonchi expansion is equal Skin: no rash seen on limited exam Musculoskeletal: No gross abnormality Psychiatric:unable to assess Neurologic:no involuntary movements         Lab Data:   Basic Metabolic Panel: Recent Labs  Lab 11/19/22 1047 11/20/22 0143  NA 137 135  K 4.4 4.2  CL 104 103  CO2 24 23  GLUCOSE 260* 256*  BUN 31* 28*  CREATININE 1.72* 1.66*  CALCIUM 8.9 8.5*    ABG: Recent Labs  Lab 11/19/22 1245 11/20/22 0609  PHART 7.25* 7.33*  PCO2ART 61* 48  PO2ART 48* 101  HCO3 27.4 25.8  O2SAT 82 97.5    Liver Function Tests: Recent Labs  Lab 11/19/22 1047  AST 19  ALT 12  ALKPHOS 78  BILITOT 0.6  PROT 5.5*  ALBUMIN 2.7*   No results for input(s): "LIPASE", "AMYLASE" in the last 168 hours. No results for input(s): "AMMONIA" in the last 168 hours.  CBC: Recent Labs  Lab 11/19/22 0232  WBC 5.9  HGB 10.6*  HCT 32.8*  MCV 98.2  PLT 180    Cardiac Enzymes: No results for input(s): "CKTOTAL", "CKMB", "CKMBINDEX", "TROPONINI" in the last 168 hours.  BNP (last 3 results) Recent Labs    08/21/22 0323 11/19/22 1617 11/20/22 0143  BNP 461.9* 512.4* 524.6*    ProBNP (last 3 results) No results for input(s):  "PROBNP" in the last 8760 hours.  Radiological Exams: No results found.  Assessment/Plan Active Problems:   Acute on chronic respiratory failure with hypoxia (HCC)   Bronchopneumonia   Acute on chronic combined systolic and diastolic CHF (congestive heart failure) (HCC)   Chronic pain syndrome   Chronic atrial fibrillation with RVR (HCC)   Acute on chronic respiratory failure hypoxia plan is to continue with the BiPAP as ordered.  Will continue pulmonary toilet and supportive care Bronchopneumonia no change will continue to follow along closely Chronic pain syndrome pain management supportive care Chronic atrial fibrillation rate is controlled at this time Combined systolic diastolic dysfunction supportive care monitor fluid status   I have personally seen and evaluated the patient, evaluated laboratory and imaging results, formulated the assessment and plan and placed orders. The Patient requires high complexity decision making with multiple systems involvement.  Rounds were done with the Respiratory Therapy Director and Staff therapists and discussed with nursing staff also.  Allyne Gee, MD [facility] Pulmonary Critical Care Medicine Sleep Medicine

## 2022-11-22 DIAGNOSIS — J180 Bronchopneumonia, unspecified organism: Secondary | ICD-10-CM | POA: Diagnosis not present

## 2022-11-22 DIAGNOSIS — I5043 Acute on chronic combined systolic (congestive) and diastolic (congestive) heart failure: Secondary | ICD-10-CM | POA: Diagnosis not present

## 2022-11-22 DIAGNOSIS — J9621 Acute and chronic respiratory failure with hypoxia: Secondary | ICD-10-CM | POA: Diagnosis not present

## 2022-11-22 DIAGNOSIS — I482 Chronic atrial fibrillation, unspecified: Secondary | ICD-10-CM | POA: Diagnosis not present

## 2022-11-22 LAB — MAGNESIUM: Magnesium: 2 mg/dL (ref 1.7–2.4)

## 2022-11-22 LAB — CBC WITH DIFFERENTIAL/PLATELET
Abs Immature Granulocytes: 0.04 10*3/uL (ref 0.00–0.07)
Basophils Absolute: 0 10*3/uL (ref 0.0–0.1)
Basophils Relative: 0 %
Eosinophils Absolute: 0.1 10*3/uL (ref 0.0–0.5)
Eosinophils Relative: 1 %
HCT: 34.3 % — ABNORMAL LOW (ref 36.0–46.0)
Hemoglobin: 10.7 g/dL — ABNORMAL LOW (ref 12.0–15.0)
Immature Granulocytes: 1 %
Lymphocytes Relative: 11 %
Lymphs Abs: 0.8 10*3/uL (ref 0.7–4.0)
MCH: 30.7 pg (ref 26.0–34.0)
MCHC: 31.2 g/dL (ref 30.0–36.0)
MCV: 98.6 fL (ref 80.0–100.0)
Monocytes Absolute: 0.8 10*3/uL (ref 0.1–1.0)
Monocytes Relative: 12 %
Neutro Abs: 5.4 10*3/uL (ref 1.7–7.7)
Neutrophils Relative %: 75 %
Platelets: 193 10*3/uL (ref 150–400)
RBC: 3.48 MIL/uL — ABNORMAL LOW (ref 3.87–5.11)
RDW: 15.1 % (ref 11.5–15.5)
WBC: 7.2 10*3/uL (ref 4.0–10.5)
nRBC: 0 % (ref 0.0–0.2)

## 2022-11-22 LAB — BASIC METABOLIC PANEL
Anion gap: 4 — ABNORMAL LOW (ref 5–15)
BUN: 19 mg/dL (ref 8–23)
CO2: 27 mmol/L (ref 22–32)
Calcium: 8.4 mg/dL — ABNORMAL LOW (ref 8.9–10.3)
Chloride: 107 mmol/L (ref 98–111)
Creatinine, Ser: 1.54 mg/dL — ABNORMAL HIGH (ref 0.44–1.00)
GFR, Estimated: 34 mL/min — ABNORMAL LOW (ref >60)
Glucose, Bld: 118 mg/dL — ABNORMAL HIGH (ref 70–99)
Potassium: 4.5 mmol/L (ref 3.5–5.1)
Sodium: 138 mmol/L (ref 135–145)

## 2022-11-22 LAB — PHOSPHORUS: Phosphorus: 2.9 mg/dL (ref 2.5–4.6)

## 2022-11-22 NOTE — Progress Notes (Signed)
Pulmonary [facility]  PROGRESS NOTE     Debra Ryan  TIR:[id number]  DOB: 1940/12/18   DOA: 11/13/2022  Referring Physician: Satira Sark, MD  HPI: Debra Ryan is a 82 y.o. female being followed for ventilator/airway/oxygen weaning Acute on Chronic Respiratory Failure.  Patient appears quite anxious.  I looked up her echo reports from last year she had an ejection fraction of approximately 35%.  Will need to do a follow-up  Medications: Reviewed on Rounds  Physical Exam:  Vitals: Temperature is 97.4 pulse 72 respiratory rate is 35 blood pressure 152/80 saturation 99%  Ventilator Settings currently on Oxymizer  General: Comfortable at this time Neck: supple Cardiovascular: no malignant arrhythmias Respiratory: Very coarse breath sounds noted bilaterally Skin: no rash seen on limited exam Musculoskeletal: No gross abnormality Psychiatric:unable to assess Neurologic:no involuntary movements         Lab Data:   Basic Metabolic Panel: Recent Labs  Lab 11/19/22 1047 11/20/22 0143 11/22/22 0326  NA 137 135 138  K 4.4 4.2 4.5  CL 104 103 107  CO2 '24 23 27  '$ GLUCOSE 260* 256* 118*  BUN 31* 28* 19  CREATININE 1.72* 1.66* 1.54*  CALCIUM 8.9 8.5* 8.4*  MG  --   --  2.0  PHOS  --   --  2.9    ABG: Recent Labs  Lab 11/19/22 1245 11/20/22 0609  PHART 7.25* 7.33*  PCO2ART 61* 48  PO2ART 48* 101  HCO3 27.4 25.8  O2SAT 82 97.5    Liver Function Tests: Recent Labs  Lab 11/19/22 1047  AST 19  ALT 12  ALKPHOS 78  BILITOT 0.6  PROT 5.5*  ALBUMIN 2.7*   No results for input(s): "LIPASE", "AMYLASE" in the last 168 hours. No results for input(s): "AMMONIA" in the last 168 hours.  CBC: Recent Labs  Lab 11/19/22 0232 11/22/22 0326  WBC 5.9 7.2  NEUTROABS  --  5.4  HGB 10.6* 10.7*  HCT 32.8* 34.3*  MCV 98.2 98.6  PLT 180 193    Cardiac Enzymes: No results  for input(s): "CKTOTAL", "CKMB", "CKMBINDEX", "TROPONINI" in the last 168 hours.  BNP (last 3 results) Recent Labs    08/21/22 0323 11/19/22 1617 11/20/22 0143  BNP 461.9* 512.4* 524.6*    ProBNP (last 3 results) No results for input(s): "PROBNP" in the last 8760 hours.  Radiological Exams: DG CHEST PORT 1 VIEW  Result Date: 11/21/2022 CLINICAL DATA:  Shortness of breath EXAM: PORTABLE CHEST 1 VIEW COMPARISON:  Chest x-ray dated November 19, 2022 FINDINGS: Cardiac and mediastinal contours are unchanged. Unchanged small moderate bilateral pleural effusions and bibasilar atelectasis. Unchanged diffuse interstitial opacities. No pneumothorax. IMPRESSION: 1. Unchanged bilateral interstitial and nodular opacities. 2. Unchanged small to moderate bilateral pleural effusions and bibasilar atelectasis. Electronically Signed   By: Yetta Glassman M.D.   On: 11/21/2022 17:18    Assessment/Plan Active Problems:   Acute on chronic respiratory failure with hypoxia (HCC)   Bronchopneumonia   Acute on chronic combined systolic and diastolic CHF (congestive heart failure) (HCC)   Chronic pain syndrome   Chronic atrial fibrillation with RVR (HCC)   Acute on chronic respiratory failure hypoxia at this time patient is going to continue with making attempts at eating high flow she does have a lot of anxiety issues going on contributing to her overall respiratory status to Chronic combined systolic diastolic heart failure last EF was 35% will do  repeat echocardiogram get cardiology involvement Chronic pain syndrome pain management patient also has a great deal of anxiety Chronic atrial fibrillation rate is controlled Bronchopneumonia no change will continue to follow along closely.   I have personally seen and evaluated the patient, evaluated laboratory and imaging results, formulated the assessment and plan and placed orders. The Patient requires high complexity decision making with multiple systems  involvement.  Rounds were done with the Respiratory Therapy Director and Staff therapists and discussed with nursing staff also.  Allyne Gee, MD [facility] Pulmonary Critical Care Medicine Sleep Medicine

## 2022-11-22 NOTE — Consult Note (Signed)
Referring Physician: Satira Sark, MD  Debra Ryan is an 82 y.o. female.                       Chief Complaint: CHF and shortness of breath  HPI: 82 years old white female with PMH of atrial fibrillation, Arthritis, DVT, GERD, type 2 DM, COPD, HLD has increasing shortness of breath with systolic left heart failure. She is on high flow oxygen, 50 Lwith 93 % Oxygen saturation.   Past Medical History:  Diagnosis Date   Arthritis    Atrial fibrillation (HCC)    Chronic pain    takes Opana daily and Percocet daily as needed   Colitis    Constipation    takes Carafate and Miralax daily   Coronary artery disease    Diabetes mellitus    has an Insulin Pump   DVT (deep venous thrombosis) (HCC)    Gastroparesis    GERD (gastroesophageal reflux disease)    High cholesterol    takes Crestor daily   History of blood clots    takes Xarelto and Brilinta daily   Hypertension    takes Coreg and Lisinopril daily   Hypothyroidism    takes Synthroid daily   MI (myocardial infarction) ([place])    Muscle spasm    takes Tizanidine daily   Neuropathy    Pulmonary embolism (HCC)       Past Surgical History:  Procedure Laterality Date   ABDOMINAL HYSTERECTOMY     ANKLE SURGERY     cardiac stents     CARDIAC SURGERY     CARDIAC SURGERY     cateract surgery     JOINT REPLACEMENT     bilateral knees   TONSILLECTOMY     WRIST SURGERY      Family History  Problem Relation Age of Onset   Cancer Mother 47       breast cancer   Cancer Brother 70       prostate cancer   Social History:  reports that she quit smoking about 52 years ago. Her smoking use included cigarettes. She has a 7.50 pack-year smoking history. She has never used smokeless tobacco. She reports that she does not drink alcohol and does not use drugs.  Allergies:  Allergies  Allergen Reactions   Sulfur Rash and Hives   Clindamycin/Lincomycin Nausea And Vomiting   Sulfa Antibiotics Hives    Medications Prior  to Admission  Medication Sig Dispense Refill   amiodarone (PACERONE) 200 MG tablet Take 1 tablet (200 mg total) by mouth 2 (two) times daily for 7 days. 14 tablet 0   amiodarone (PACERONE) 200 MG tablet Take 1 tablet (200 mg total) by mouth daily. 30 tablet 0   amiodarone (PACERONE) 400 MG tablet Take 1 tablet (400 mg total) by mouth 2 (two) times daily for 4 days. 8 tablet 0   Ascorbic Acid (VITAMIN C PO) Take 1 tablet by mouth daily.     aspirin 81 MG tablet Take 81 mg by mouth daily.     atorvastatin (LIPITOR) 40 MG tablet Take 40 mg by mouth daily.     Carboxymethylcellulose Sodium (DRY EYE RELIEF OP) Place 1-2 drops into both eyes as needed (Dry eyes).     carvedilol (COREG) 25 MG tablet Take 1 tablet (25 mg total) by mouth in the morning and at bedtime. 60 tablet 0   furosemide (LASIX) 40 MG tablet Take 1 tablet (40 mg total) by mouth  daily. 30 tablet 0   Insulin Aspart FlexPen (NOVOLOG) 100 UNIT/ML Inject 29 Units into the skin daily. Uses insulin pump     levothyroxine (SYNTHROID) 200 MCG tablet Take 200 mcg by mouth daily before breakfast.     Mesalamine (ASACOL HD) 800 MG TBEC Take 1 tablet by mouth daily.     Multiple Vitamins-Minerals (MULTIVITAMIN PO) Take 1 tablet by mouth daily.     nitroGLYCERIN (NITROSTAT) 0.4 MG SL tablet Place 0.4 mg under the tongue every 5 (five) minutes as needed for chest pain.     omeprazole (PRILOSEC) 40 MG capsule Take 40 mg by mouth daily.     oxyCODONE (OXYCONTIN) 15 mg 12 hr tablet Take 15 mg by mouth every 12 (twelve) hours.     oxyCODONE (ROXICODONE) 15 MG immediate release tablet Take 15 mg by mouth every 4 (four) hours as needed for pain.     Rivaroxaban (XARELTO) 15 MG TABS tablet Take 15 mg by mouth daily with supper.     spironolactone (ALDACTONE) 25 MG tablet Take 1 tablet (25 mg total) by mouth daily. 30 tablet 0   vitamin E 100 UNIT capsule Take 100 Units by mouth daily.      Results for orders placed or performed during the hospital  encounter of 11/10/2022 (from the past 48 hour(s))  CBC with Differential/Platelet     Status: Abnormal   Collection Time: 11/22/22  3:26 AM  Result Value Ref Range   WBC 7.2 4.0 - 10.5 K/uL   RBC 3.48 (L) 3.87 - 5.11 MIL/uL   Hemoglobin 10.7 (L) 12.0 - 15.0 g/dL   HCT 34.3 (L) 36.0 - 46.0 %   MCV 98.6 80.0 - 100.0 fL   MCH 30.7 26.0 - 34.0 pg   MCHC 31.2 30.0 - 36.0 g/dL   RDW 15.1 11.5 - 15.5 %   Platelets 193 150 - 400 K/uL   nRBC 0.0 0.0 - 0.2 %   Neutrophils Relative % 75 %   Neutro Abs 5.4 1.7 - 7.7 K/uL   Lymphocytes Relative 11 %   Lymphs Abs 0.8 0.7 - 4.0 K/uL   Monocytes Relative 12 %   Monocytes Absolute 0.8 0.1 - 1.0 K/uL   Eosinophils Relative 1 %   Eosinophils Absolute 0.1 0.0 - 0.5 K/uL   Basophils Relative 0 %   Basophils Absolute 0.0 0.0 - 0.1 K/uL   Immature Granulocytes 1 %   Abs Immature Granulocytes 0.04 0.00 - 0.07 K/uL    Comment: Performed at [facility], [address], [place], [place] [address]  Basic metabolic panel     Status: Abnormal   Collection Time: 11/22/22  3:26 AM  Result Value Ref Range   Sodium 138 135 - 145 mmol/L   Potassium 4.5 3.5 - 5.1 mmol/L   Chloride 107 98 - 111 mmol/L   CO2 27 22 - 32 mmol/L   Glucose, Bld 118 (H) 70 - 99 mg/dL    Comment: Glucose reference range applies only to samples taken after fasting for at least 8 hours.   BUN 19 8 - 23 mg/dL   Creatinine, Ser 1.54 (H) 0.44 - 1.00 mg/dL   Calcium 8.4 (L) 8.9 - 10.3 mg/dL   GFR, Estimated 34 (L) >60 mL/min    Comment: (NOTE) Calculated using the CKD-EPI Creatinine Equation (2021)    Anion gap 4 (L) 5 - 15    Comment: Performed at [facility] [address], [place], [place]  [address]  Magnesium     Status: None   Collection Time: 11/22/22  3:26 AM  Result Value Ref Range   Magnesium 2.0 1.7 - 2.4 mg/dL    Comment: Performed at [facility], [place] [address] [place], [place] [address]  Phosphorus     Status: None   Collection Time: 11/22/22   3:26 AM  Result Value Ref Range   Phosphorus 2.9 2.5 - 4.6 mg/dL    Comment: Performed at [facility] [address], [place], [place] [address]   DG CHEST PORT 1 VIEW  Result Date: 11/21/2022 CLINICAL DATA:  Shortness of breath EXAM: PORTABLE CHEST 1 VIEW COMPARISON:  Chest x-ray dated November 19, 2022 FINDINGS: Cardiac and mediastinal contours are unchanged. Unchanged small moderate bilateral pleural effusions and bibasilar atelectasis. Unchanged diffuse interstitial opacities. No pneumothorax. IMPRESSION: 1. Unchanged bilateral interstitial and nodular opacities. 2. Unchanged small to moderate bilateral pleural effusions and bibasilar atelectasis. Electronically Signed   By: Yetta Glassman M.D.   On: 11/21/2022 17:18    Review Of Systems As per HPI and PMH.   P: 94, R: 24, BP: 152/80, 50L high flow oxygen. There is no height or weight on file to calculate BMI. General appearance: alert, cooperative, appears stated age and no distress Head: Normocephalic, atraumatic. Eyes: Blue eyes, Pale pink conjunctiva, corneas clear. Neck: No adenopathy, no carotid bruit, no JVD, supple, symmetrical, trachea midline and thyroid not enlarged. Resp: Coarce to auscultation bilaterally. Cardio: Regular rate and rhythm, S1, S2 normal, II/VI systolic murmur, no click, rub or gallop GI: Soft, non-tender; bowel sounds normal; no organomegaly. Extremities: No edema, cyanosis or clubbing. Skin: Warm and dry.  Neurologic: Alert and oriented X 1.  Assessment/Plan Acute on chronic respiratory failure with hypoxia Acute on chronic systolic left heart failure, HFrEF HTN HLD Type 2 DM S/P DVT Chronic atrial fibrillation Arthritis GERD  Plan: Continue medical treatment with amiodarone, Eliquis, Atorvastatin and carvedilol Review Echocardiogram when done.  Time spent: Review of old records, Lab, x-rays, EKG, other cardiac tests, examination, discussion with patient/Nurse/Doctor over 70  minutes.  Birdie Riddle, MD  11/22/2022, 6:37 PM

## 2022-11-23 ENCOUNTER — Other Ambulatory Visit (HOSPITAL_COMMUNITY): Payer: Medicare Other

## 2022-11-23 DIAGNOSIS — I482 Chronic atrial fibrillation, unspecified: Secondary | ICD-10-CM | POA: Diagnosis not present

## 2022-11-23 DIAGNOSIS — J180 Bronchopneumonia, unspecified organism: Secondary | ICD-10-CM | POA: Diagnosis not present

## 2022-11-23 DIAGNOSIS — J9621 Acute and chronic respiratory failure with hypoxia: Secondary | ICD-10-CM | POA: Diagnosis not present

## 2022-11-23 DIAGNOSIS — I5043 Acute on chronic combined systolic (congestive) and diastolic (congestive) heart failure: Secondary | ICD-10-CM | POA: Diagnosis not present

## 2022-11-23 MED ORDER — PERFLUTREN LIPID MICROSPHERE
1.0000 mL | INTRAVENOUS | Status: AC | PRN
  Administered 2022-11-23: 2 mL via INTRAVENOUS

## 2022-11-23 NOTE — Progress Notes (Signed)
Echocardiogram 2D Echocardiogram has been performed.  Debra Ryan 11/23/2022, 12:01 PM

## 2022-11-23 NOTE — Progress Notes (Signed)
Pulmonary [place]  PROGRESS NOTE     ANNIS LAGOY  LOV:[id number]  DOB: 06-10-1941   DOA: 11/24/2022  Referring Physician: Satira Sark, MD  HPI: Debra Ryan is a 82 y.o. female being followed for ventilator/airway/oxygen weaning Acute on Chronic Respiratory Failure.  Patient is resting comfortably without distress using the BiPAP at nighttime this morning she was on Oxymizer saturations were good  Medications: Reviewed on Rounds  Physical Exam:  Vitals: Temperature is 97.0 pulse of 68 respiratory 15 blood pressure is 136/87 saturation is 99%  Ventilator Settings currently is on Oxymizer 8 L  General: Comfortable at this time Neck: supple Cardiovascular: no malignant arrhythmias Respiratory: No rhonchi no rales are noted at this time Skin: no rash seen on limited exam Musculoskeletal: No gross abnormality Psychiatric:unable to assess Neurologic:no involuntary movements         Lab Data:   Basic Metabolic Panel: Recent Labs  Lab 11/19/22 1047 11/20/22 0143 11/22/22 0326  NA 137 135 138  K 4.4 4.2 4.5  CL 104 103 107  CO2 '24 23 27  '$ GLUCOSE 260* 256* 118*  BUN 31* 28* 19  CREATININE 1.72* 1.66* 1.54*  CALCIUM 8.9 8.5* 8.4*  MG  --   --  2.0  PHOS  --   --  2.9    ABG: Recent Labs  Lab 11/19/22 1245 11/20/22 0609  PHART 7.25* 7.33*  PCO2ART 61* 48  PO2ART 48* 101  HCO3 27.4 25.8  O2SAT 82 97.5    Liver Function Tests: Recent Labs  Lab 11/19/22 1047  AST 19  ALT 12  ALKPHOS 78  BILITOT 0.6  PROT 5.5*  ALBUMIN 2.7*   No results for input(s): "LIPASE", "AMYLASE" in the last 168 hours. No results for input(s): "AMMONIA" in the last 168 hours.  CBC: Recent Labs  Lab 11/19/22 0232 11/22/22 0326  WBC 5.9 7.2  NEUTROABS  --  5.4  HGB 10.6* 10.7*  HCT 32.8* 34.3*  MCV 98.2 98.6  PLT 180 193    Cardiac Enzymes: No results for input(s):  "CKTOTAL", "CKMB", "CKMBINDEX", "TROPONINI" in the last 168 hours.  BNP (last 3 results) Recent Labs    08/21/22 0323 11/19/22 1617 11/20/22 0143  BNP 461.9* 512.4* 524.6*    ProBNP (last 3 results) No results for input(s): "PROBNP" in the last 8760 hours.  Radiological Exams: DG CHEST PORT 1 VIEW  Result Date: 11/21/2022 CLINICAL DATA:  Shortness of breath EXAM: PORTABLE CHEST 1 VIEW COMPARISON:  Chest x-ray dated November 19, 2022 FINDINGS: Cardiac and mediastinal contours are unchanged. Unchanged small moderate bilateral pleural effusions and bibasilar atelectasis. Unchanged diffuse interstitial opacities. No pneumothorax. IMPRESSION: 1. Unchanged bilateral interstitial and nodular opacities. 2. Unchanged small to moderate bilateral pleural effusions and bibasilar atelectasis. Electronically Signed   By: Yetta Glassman M.D.   On: 11/21/2022 17:18    Assessment/Plan Active Problems:   Acute on chronic respiratory failure with hypoxia (HCC)   Bronchopneumonia   Acute on chronic combined systolic and diastolic CHF (congestive heart failure) (HCC)   Chronic pain syndrome   Chronic atrial fibrillation with RVR (HCC)   Acute on chronic respiratory failure hypoxia plan is to continue with the Oxymizer as tolerated Lobar pneumonia has been treated with antibiotics slow improvement patient has bilateral interstitial series moderate effusions Heart failure now being seen by cardiology will continue with supportive care follow-up pulm recommendations Chronic pain syndrome pain management will  continue with supportive care Chronic atrial fibrillation rate is controlled at this time   I have personally seen and evaluated the patient, evaluated laboratory and imaging results, formulated the assessment and plan and placed orders. The Patient requires high complexity decision making with multiple systems involvement.  Rounds were done with the Respiratory Therapy Director and Staff  therapists and discussed with nursing staff also.  Allyne Gee, MD [facility] Pulmonary Critical Care Medicine Sleep Medicine

## 2022-11-24 ENCOUNTER — Other Ambulatory Visit (HOSPITAL_COMMUNITY): Payer: Medicare Other

## 2022-11-24 DIAGNOSIS — J9621 Acute and chronic respiratory failure with hypoxia: Secondary | ICD-10-CM | POA: Diagnosis not present

## 2022-11-24 DIAGNOSIS — J180 Bronchopneumonia, unspecified organism: Secondary | ICD-10-CM | POA: Diagnosis not present

## 2022-11-24 DIAGNOSIS — I5043 Acute on chronic combined systolic (congestive) and diastolic (congestive) heart failure: Secondary | ICD-10-CM | POA: Diagnosis not present

## 2022-11-24 DIAGNOSIS — I482 Chronic atrial fibrillation, unspecified: Secondary | ICD-10-CM | POA: Diagnosis not present

## 2022-11-24 HISTORY — PX: IR THORACENTESIS ASP PLEURAL SPACE W/IMG GUIDE: IMG5380

## 2022-11-24 LAB — CBC WITH DIFFERENTIAL/PLATELET
Abs Immature Granulocytes: 0.02 10*3/uL (ref 0.00–0.07)
Basophils Absolute: 0 10*3/uL (ref 0.0–0.1)
Basophils Relative: 0 %
Eosinophils Absolute: 0 10*3/uL (ref 0.0–0.5)
Eosinophils Relative: 0 %
HCT: 33.8 % — ABNORMAL LOW (ref 36.0–46.0)
Hemoglobin: 10.7 g/dL — ABNORMAL LOW (ref 12.0–15.0)
Immature Granulocytes: 0 %
Lymphocytes Relative: 9 %
Lymphs Abs: 0.6 10*3/uL — ABNORMAL LOW (ref 0.7–4.0)
MCH: 31.2 pg (ref 26.0–34.0)
MCHC: 31.7 g/dL (ref 30.0–36.0)
MCV: 98.5 fL (ref 80.0–100.0)
Monocytes Absolute: 0.1 10*3/uL (ref 0.1–1.0)
Monocytes Relative: 2 %
Neutro Abs: 6.1 10*3/uL (ref 1.7–7.7)
Neutrophils Relative %: 89 %
Platelets: 221 10*3/uL (ref 150–400)
RBC: 3.43 MIL/uL — ABNORMAL LOW (ref 3.87–5.11)
RDW: 14.6 % (ref 11.5–15.5)
WBC: 6.8 10*3/uL (ref 4.0–10.5)
nRBC: 0 % (ref 0.0–0.2)

## 2022-11-24 LAB — ECHOCARDIOGRAM COMPLETE
Area-P 1/2: 3.77 cm2
Calc EF: 43.4 %
MV M vel: 5.97 m/s
MV Peak grad: 142.6 mmHg
MV VTI: 2.38 cm2
Radius: 0.3 cm
S' Lateral: 3.9 cm
Single Plane A2C EF: 45.8 %
Single Plane A4C EF: 42.8 %

## 2022-11-24 LAB — BODY FLUID CELL COUNT WITH DIFFERENTIAL
Eos, Fluid: 0 %
Lymphs, Fluid: 57 %
Monocyte-Macrophage-Serous Fluid: 16 % — ABNORMAL LOW (ref 50–90)
Neutrophil Count, Fluid: 27 % — ABNORMAL HIGH (ref 0–25)
Total Nucleated Cell Count, Fluid: 260 cu mm (ref 0–1000)

## 2022-11-24 LAB — GLUCOSE, PLEURAL OR PERITONEAL FLUID: Glucose, Fluid: 394 mg/dL

## 2022-11-24 LAB — AMYLASE, PLEURAL OR PERITONEAL FLUID: Amylase, Fluid: 19 U/L

## 2022-11-24 LAB — COMPREHENSIVE METABOLIC PANEL
ALT: 15 U/L (ref 0–44)
AST: 18 U/L (ref 15–41)
Albumin: 2.6 g/dL — ABNORMAL LOW (ref 3.5–5.0)
Alkaline Phosphatase: 95 U/L (ref 38–126)
Anion gap: 7 (ref 5–15)
BUN: 16 mg/dL (ref 8–23)
CO2: 28 mmol/L (ref 22–32)
Calcium: 8.6 mg/dL — ABNORMAL LOW (ref 8.9–10.3)
Chloride: 102 mmol/L (ref 98–111)
Creatinine, Ser: 1.45 mg/dL — ABNORMAL HIGH (ref 0.44–1.00)
GFR, Estimated: 37 mL/min — ABNORMAL LOW (ref >60)
Glucose, Bld: 287 mg/dL — ABNORMAL HIGH (ref 70–99)
Potassium: 4.5 mmol/L (ref 3.5–5.1)
Sodium: 137 mmol/L (ref 135–145)
Total Bilirubin: 0.7 mg/dL (ref 0.3–1.2)
Total Protein: 5.2 g/dL — ABNORMAL LOW (ref 6.5–8.1)

## 2022-11-24 LAB — MAGNESIUM: Magnesium: 1.8 mg/dL (ref 1.7–2.4)

## 2022-11-24 LAB — PHOSPHORUS: Phosphorus: 3.5 mg/dL (ref 2.5–4.6)

## 2022-11-24 LAB — PROTEIN, PLEURAL OR PERITONEAL FLUID: Total protein, fluid: <3 g/dL

## 2022-11-24 LAB — LACTATE DEHYDROGENASE, PLEURAL OR PERITONEAL FLUID: LD, Fluid: 61 U/L — ABNORMAL HIGH (ref 3–23)

## 2022-11-24 LAB — ALBUMIN, PLEURAL OR PERITONEAL FLUID: Albumin, Fluid: <1.5 g/dL

## 2022-11-24 MED ORDER — LIDOCAINE HCL 1 % IJ SOLN
INTRAMUSCULAR | Status: AC
  Administered 2022-11-24: 8 mL
  Filled 2022-11-24: qty 20

## 2022-11-24 NOTE — Procedures (Signed)
PROCEDURE SUMMARY:  Successful US guided left thoracentesis. Yielded 500 mL of amber fluid. Pt tolerated procedure well. No immediate complications.  Specimen was sent for labs. CXR ordered.  EBL < 5 mL  Docia Barrier PA-C 11/24/2022 12:34 PM

## 2022-11-25 DIAGNOSIS — J180 Bronchopneumonia, unspecified organism: Secondary | ICD-10-CM | POA: Diagnosis not present

## 2022-11-25 DIAGNOSIS — J9621 Acute and chronic respiratory failure with hypoxia: Secondary | ICD-10-CM | POA: Diagnosis not present

## 2022-11-25 DIAGNOSIS — I482 Chronic atrial fibrillation, unspecified: Secondary | ICD-10-CM | POA: Diagnosis not present

## 2022-11-25 DIAGNOSIS — I5043 Acute on chronic combined systolic (congestive) and diastolic (congestive) heart failure: Secondary | ICD-10-CM | POA: Diagnosis not present

## 2022-11-25 NOTE — Progress Notes (Cosign Needed)
Pulmonary [place]  PROGRESS NOTE     Debra Ryan  WJX:[id number]  DOB: July 11, 1941   DOA: 11/28/2022  Referring Physician: Satira Sark, MD  HPI: Debra Ryan is a 82 y.o. female being followed for ventilator/airway/oxygen weaning Acute on Chronic Respiratory Failure.  Patient seen lying in bed, currently on 6 L nasal cannula.  Pending right thoracentesis.  Medications: Reviewed on Rounds  Physical Exam:  Vitals: Temp 98.0, pulse 80, respirations 18, BP 127/75, SpO2 98%  Ventilator Settings 6 L nasal cannula  General: Comfortable at this time Neck: supple Cardiovascular: no malignant arrhythmias Respiratory: Bilaterally diminished Skin: no rash seen on limited exam Musculoskeletal: No gross abnormality Psychiatric:unable to assess Neurologic:no involuntary movements         Lab Data:   Basic Metabolic Panel: Recent Labs  Lab 11/19/22 1047 11/20/22 0143 11/22/22 0326 11/24/22 0237  NA 137 135 138 137  K 4.4 4.2 4.5 4.5  CL 104 103 107 102  CO2 '24 23 27 28  '$ GLUCOSE 260* 256* 118* 287*  BUN 31* 28* 19 16  CREATININE 1.72* 1.66* 1.54* 1.45*  CALCIUM 8.9 8.5* 8.4* 8.6*  MG  --   --  2.0 1.8  PHOS  --   --  2.9 3.5    ABG: Recent Labs  Lab 11/19/22 1245 11/20/22 0609  PHART 7.25* 7.33*  PCO2ART 61* 48  PO2ART 48* 101  HCO3 27.4 25.8  O2SAT 82 97.5    Liver Function Tests: Recent Labs  Lab 11/19/22 1047 11/24/22 0237  AST 19 18  ALT 12 15  ALKPHOS 78 95  BILITOT 0.6 0.7  PROT 5.5* 5.2*  ALBUMIN 2.7* 2.6*   No results for input(s): "LIPASE", "AMYLASE" in the last 168 hours. No results for input(s): "AMMONIA" in the last 168 hours.  CBC: Recent Labs  Lab 11/19/22 0232 11/22/22 0326 11/24/22 0237  WBC 5.9 7.2 6.8  NEUTROABS  --  5.4 6.1  HGB 10.6* 10.7* 10.7*  HCT 32.8* 34.3* 33.8*  MCV 98.2 98.6 98.5  PLT 180 193 221    Cardiac  Enzymes: No results for input(s): "CKTOTAL", "CKMB", "CKMBINDEX", "TROPONINI" in the last 168 hours.  BNP (last 3 results) Recent Labs    08/21/22 0323 11/19/22 1617 11/20/22 0143  BNP 461.9* 512.4* 524.6*    ProBNP (last 3 results) No results for input(s): "PROBNP" in the last 8760 hours.  Radiological Exams: IR THORACENTESIS ASP PLEURAL SPACE W/IMG GUIDE  Result Date: 11/24/2022 INDICATION: Patient with history of acute respiratory failure, recurrent pleural effusions. Request is made for diagnostic and therapeutic thoracentesis. EXAM: ULTRASOUND GUIDED DIAGNOSTIC AND THERAPEUTIC LEFT THORACENTESIS MEDICATIONS: 10 mL 1% lidocaine COMPLICATIONS: None immediate. PROCEDURE: An ultrasound guided thoracentesis was thoroughly discussed with the patient and questions answered. The benefits, risks, alternatives and complications were also discussed. The patient understands and wishes to proceed with the procedure. Written consent was obtained. Ultrasound was performed to localize and mark an adequate pocket of fluid in the left chest. The area was then prepped and draped in the normal sterile fashion. 1% Lidocaine was used for local anesthesia. Under ultrasound guidance a 6 Fr Safe-T-Centesis catheter was introduced. Thoracentesis was performed. The catheter was removed and a dressing applied. FINDINGS: A total of approximately 500 mL of amber fluid was removed. Samples were sent to the laboratory as requested by the clinical team. IMPRESSION: Successful ultrasound guided left thoracentesis yielding 500 mL of pleural  fluid. Read by: Brynda Greathouse PA-C Electronically Signed   By: Sandi Mariscal M.D.   On: 11/24/2022 15:29   DG CHEST PORT 1 VIEW  Result Date: 11/24/2022 CLINICAL DATA:  Status post left thoracentesis. EXAM: PORTABLE CHEST 1 VIEW COMPARISON:  One-view chest x-ray 11/23/2022 FINDINGS: The heart is enlarged. Interstitial edema is slightly improved. Left pleural effusion has decreased. No  pneumothorax is present. Right pleural effusion is stable. IMPRESSION: 1. Interval decrease in left pleural effusion without pneumothorax. 2. Slight improvement in interstitial edema. Electronically Signed   By: San Morelle M.D.   On: 11/24/2022 13:21   [place] CHEST (PLEURAL EFFUSION)  Result Date: 11/24/2022 CLINICAL DATA:  Pleural effusion EXAM: CHEST ULTRASOUND COMPARISON:  None Available. FINDINGS: Limited sonography of the lung bases bilaterally demonstrate moderate bilateral pleural effusions. IMPRESSION: 1. Moderate bilateral pleural effusions. Electronically Signed   By: Fidela Salisbury M.D.   On: 11/24/2022 01:38    Assessment/Plan Active Problems:   Acute on chronic respiratory failure with hypoxia (HCC)   Bronchopneumonia   Acute on chronic combined systolic and diastolic CHF (congestive heart failure) (HCC)   Chronic pain syndrome   Chronic atrial fibrillation with RVR (HCC)   Acute on chronic respiratory failure hypoxia-off of Oxymizer and currently on 6 L nasal cannula.  Continue titrating oxygen down as patient tolerates. Lobar pneumonia has been treated with antibiotics slow improvement patient has bilateral interstitial series moderate effusions Heart failure now being seen by cardiology will continue with supportive care follow-up pulm recommendations Chronic pain syndrome pain management will continue with supportive care Chronic atrial fibrillation rate is controlled at this time Bilateral pleural effusion-left thoracentesis completed with removal of 500 mL.  Pending right thoracentesis   I have personally seen and evaluated the patient, evaluated laboratory and imaging results, formulated the assessment and plan and placed orders. The Patient requires high complexity decision making with multiple systems involvement.  Rounds were done with the Respiratory Therapy Director and Staff therapists and discussed with nursing staff also.  Allyne Gee, MD [facility] Pulmonary  Critical Care Medicine Sleep Medicine

## 2022-11-25 NOTE — Progress Notes (Cosign Needed)
Pulmonary [place]  PROGRESS NOTE     Debra Ryan  IRW:[id number]  DOB: 1940/12/01   DOA: 11/22/2022  Referring Physician: Satira Sark, MD  HPI: Debra Ryan is a 82 y.o. female being followed for ventilator/airway/oxygen weaning Acute on Chronic Respiratory Failure.  Patient seen lying in bed, currently on 6 L Oxymizer.  She did have a left thoracentesis today with removal of 500 mL.  Pending right thoracentesis.  Medications: Reviewed on Rounds  Physical Exam:  Vitals: Temp 98.5, pulse 74, respirations 18, BP 154/72, SpO2 99%  Ventilator Settings 6 L Oxymizer  General: Comfortable at this time Neck: supple Cardiovascular: no malignant arrhythmias Respiratory: Bilaterally diminished Skin: no rash seen on limited exam Musculoskeletal: No gross abnormality Psychiatric:unable to assess Neurologic:no involuntary movements         Lab Data:   Basic Metabolic Panel: Recent Labs  Lab 11/19/22 1047 11/20/22 0143 11/22/22 0326 11/24/22 0237  NA 137 135 138 137  K 4.4 4.2 4.5 4.5  CL 104 103 107 102  CO2 '24 23 27 28  '$ GLUCOSE 260* 256* 118* 287*  BUN 31* 28* 19 16  CREATININE 1.72* 1.66* 1.54* 1.45*  CALCIUM 8.9 8.5* 8.4* 8.6*  MG  --   --  2.0 1.8  PHOS  --   --  2.9 3.5    ABG: Recent Labs  Lab 11/19/22 1245 11/20/22 0609  PHART 7.25* 7.33*  PCO2ART 61* 48  PO2ART 48* 101  HCO3 27.4 25.8  O2SAT 82 97.5    Liver Function Tests: Recent Labs  Lab 11/19/22 1047 11/24/22 0237  AST 19 18  ALT 12 15  ALKPHOS 78 95  BILITOT 0.6 0.7  PROT 5.5* 5.2*  ALBUMIN 2.7* 2.6*   No results for input(s): "LIPASE", "AMYLASE" in the last 168 hours. No results for input(s): "AMMONIA" in the last 168 hours.  CBC: Recent Labs  Lab 11/19/22 0232 11/22/22 0326 11/24/22 0237  WBC 5.9 7.2 6.8  NEUTROABS  --  5.4 6.1  HGB 10.6* 10.7* 10.7*  HCT 32.8* 34.3* 33.8*   MCV 98.2 98.6 98.5  PLT 180 193 221    Cardiac Enzymes: No results for input(s): "CKTOTAL", "CKMB", "CKMBINDEX", "TROPONINI" in the last 168 hours.  BNP (last 3 results) Recent Labs    08/21/22 0323 11/19/22 1617 11/20/22 0143  BNP 461.9* 512.4* 524.6*    ProBNP (last 3 results) No results for input(s): "PROBNP" in the last 8760 hours.  Radiological Exams: IR THORACENTESIS ASP PLEURAL SPACE W/IMG GUIDE  Result Date: 11/24/2022 INDICATION: Patient with history of acute respiratory failure, recurrent pleural effusions. Request is made for diagnostic and therapeutic thoracentesis. EXAM: ULTRASOUND GUIDED DIAGNOSTIC AND THERAPEUTIC LEFT THORACENTESIS MEDICATIONS: 10 mL 1% lidocaine COMPLICATIONS: None immediate. PROCEDURE: An ultrasound guided thoracentesis was thoroughly discussed with the patient and questions answered. The benefits, risks, alternatives and complications were also discussed. The patient understands and wishes to proceed with the procedure. Written consent was obtained. Ultrasound was performed to localize and mark an adequate pocket of fluid in the left chest. The area was then prepped and draped in the normal sterile fashion. 1% Lidocaine was used for local anesthesia. Under ultrasound guidance a 6 Fr Safe-T-Centesis catheter was introduced. Thoracentesis was performed. The catheter was removed and a dressing applied. FINDINGS: A total of approximately 500 mL of amber fluid was removed. Samples were sent to the laboratory as requested by the clinical team.  IMPRESSION: Successful ultrasound guided left thoracentesis yielding 500 mL of pleural fluid. Read by: Brynda Greathouse PA-C Electronically Signed   By: Sandi Mariscal M.D.   On: 11/24/2022 15:29   DG CHEST PORT 1 VIEW  Result Date: 11/24/2022 CLINICAL DATA:  Status post left thoracentesis. EXAM: PORTABLE CHEST 1 VIEW COMPARISON:  One-view chest x-ray 11/23/2022 FINDINGS: The heart is enlarged. Interstitial edema is slightly  improved. Left pleural effusion has decreased. No pneumothorax is present. Right pleural effusion is stable. IMPRESSION: 1. Interval decrease in left pleural effusion without pneumothorax. 2. Slight improvement in interstitial edema. Electronically Signed   By: San Morelle M.D.   On: 11/24/2022 13:21   [place] CHEST (PLEURAL EFFUSION)  Result Date: 11/24/2022 CLINICAL DATA:  Pleural effusion EXAM: CHEST ULTRASOUND COMPARISON:  None Available. FINDINGS: Limited sonography of the lung bases bilaterally demonstrate moderate bilateral pleural effusions. IMPRESSION: 1. Moderate bilateral pleural effusions. Electronically Signed   By: Fidela Salisbury M.D.   On: 11/24/2022 01:38    Assessment/Plan Active Problems:   Acute on chronic respiratory failure with hypoxia (HCC)   Bronchopneumonia   Acute on chronic combined systolic and diastolic CHF (congestive heart failure) (HCC)   Chronic pain syndrome   Chronic atrial fibrillation with RVR (HCC)   Acute on chronic respiratory failure hypoxia plan is to continue with the Oxymizer as tolerated.  Continue titrating oxygen down as patient tolerates. Lobar pneumonia has been treated with antibiotics slow improvement patient has bilateral interstitial series moderate effusions Heart failure now being seen by cardiology will continue with supportive care follow-up pulm recommendations Chronic pain syndrome pain management will continue with supportive care Chronic atrial fibrillation rate is controlled at this time Bilateral pleural effusion-left thoracentesis completed yesterday with removal of 500 mL.  Pending right thoracentesis   I have personally seen and evaluated the patient, evaluated laboratory and imaging results, formulated the assessment and plan and placed orders. The Patient requires high complexity decision making with multiple systems involvement.  Rounds were done with the Respiratory Therapy Director and Staff therapists and discussed  with nursing staff also.  Allyne Gee, MD [facility] Pulmonary Critical Care Medicine Sleep Medicine

## 2022-11-25 NOTE — Progress Notes (Incomplete)
Pulmonary [place]  PROGRESS NOTE     Debra Ryan  [id number]  DOB: [birth year]   DOA: 11/23/2022  Referring Physician: Satira Sark, MD  HPI: Debra Ryan is a 82 y.o. female being followed for ventilator/airway/oxygen weaning Acute on Chronic Respiratory Failure. ***  Medications: Reviewed on Rounds  Physical Exam:  Vitals: ***  Ventilator Settings ***  General: Comfortable at this time Neck: supple Cardiovascular: no malignant arrhythmias Respiratory: *** Skin: no rash seen on limited exam Musculoskeletal: No gross abnormality Psychiatric:unable to assess Neurologic:no involuntary movements         Lab Data:   Basic Metabolic Panel: Recent Labs  Lab 11/19/22 1047 11/20/22 0143 11/22/22 0326 11/24/22 0237  NA 137 135 138 137  K 4.4 4.2 4.5 4.5  CL 104 103 107 102  CO2 '24 23 27 28  '$ GLUCOSE 260* 256* 118* 287*  BUN 31* 28* 19 16  CREATININE 1.72* 1.66* 1.54* 1.45*  CALCIUM 8.9 8.5* 8.4* 8.6*  MG  --   --  2.0 1.8  PHOS  --   --  2.9 3.5    ABG: Recent Labs  Lab 11/19/22 1245 11/20/22 0609  PHART 7.25* 7.33*  PCO2ART 61* 48  PO2ART 48* 101  HCO3 27.4 25.8  O2SAT 82 97.5    Liver Function Tests: Recent Labs  Lab 11/19/22 1047 11/24/22 0237  AST 19 18  ALT 12 15  ALKPHOS 78 95  BILITOT 0.6 0.7  PROT 5.5* 5.2*  ALBUMIN 2.7* 2.6*   No results for input(s): "LIPASE", "AMYLASE" in the last 168 hours. No results for input(s): "AMMONIA" in the last 168 hours.  CBC: Recent Labs  Lab 11/19/22 0232 11/22/22 0326 11/24/22 0237  WBC 5.9 7.2 6.8  NEUTROABS  --  5.4 6.1  HGB 10.6* 10.7* 10.7*  HCT 32.8* 34.3* 33.8*  MCV 98.2 98.6 98.5  PLT 180 193 221    Cardiac Enzymes: No results for input(s): "CKTOTAL", "CKMB", "CKMBINDEX", "TROPONINI" in the last 168 hours.  BNP (last 3 results) Recent Labs    08/21/22 0323 11/19/22 1617  11/20/22 0143  BNP 461.9* 512.4* 524.6*    ProBNP (last 3 results) No results for input(s): "PROBNP" in the last 8760 hours.  Radiological Exams: IR THORACENTESIS ASP PLEURAL SPACE W/IMG GUIDE  Result Date: 11/24/2022 INDICATION: Patient with history of acute respiratory failure, recurrent pleural effusions. Request is made for diagnostic and therapeutic thoracentesis. EXAM: ULTRASOUND GUIDED DIAGNOSTIC AND THERAPEUTIC LEFT THORACENTESIS MEDICATIONS: 10 mL 1% lidocaine COMPLICATIONS: None immediate. PROCEDURE: An ultrasound guided thoracentesis was thoroughly discussed with the patient and questions answered. The benefits, risks, alternatives and complications were also discussed. The patient understands and wishes to proceed with the procedure. Written consent was obtained. Ultrasound was performed to localize and mark an adequate pocket of fluid in the left chest. The area was then prepped and draped in the normal sterile fashion. 1% Lidocaine was used for local anesthesia. Under ultrasound guidance a 6 Fr Safe-T-Centesis catheter was introduced. Thoracentesis was performed. The catheter was removed and a dressing applied. FINDINGS: A total of approximately 500 mL of amber fluid was removed. Samples were sent to the laboratory as requested by the clinical team. IMPRESSION: Successful ultrasound guided left thoracentesis yielding 500 mL of pleural fluid. Read by: Brynda Greathouse PA-C Electronically Signed   By: Sandi Mariscal M.D.   On: 11/24/2022 15:29   DG CHEST PORT 1 VIEW  Result Date: 11/24/2022 CLINICAL DATA:  Status post left thoracentesis. EXAM: PORTABLE CHEST 1 VIEW COMPARISON:  One-view chest x-ray 11/23/2022 FINDINGS: The heart is enlarged. Interstitial edema is slightly improved. Left pleural effusion has decreased. No pneumothorax is present. Right pleural effusion is stable. IMPRESSION: 1. Interval decrease in left pleural effusion without pneumothorax. 2. Slight improvement in interstitial  edema. Electronically Signed   By: San Morelle M.D.   On: 11/24/2022 13:21   [place] CHEST (PLEURAL EFFUSION)  Result Date: 11/24/2022 CLINICAL DATA:  Pleural effusion EXAM: CHEST ULTRASOUND COMPARISON:  None Available. FINDINGS: Limited sonography of the lung bases bilaterally demonstrate moderate bilateral pleural effusions. IMPRESSION: 1. Moderate bilateral pleural effusions. Electronically Signed   By: Fidela Salisbury M.D.   On: 11/24/2022 01:38    Assessment/Plan Active Problems:   Acute on chronic respiratory failure with hypoxia (HCC)   Bronchopneumonia   Acute on chronic combined systolic and diastolic CHF (congestive heart failure) (HCC)   Chronic pain syndrome   Chronic atrial fibrillation with RVR (HCC)   Acute on chronic respiratory failure hypoxia-off of Oxymizer and currently on 6 L nasal cannula.  Continue titrating oxygen down as patient tolerates. Lobar pneumonia has been treated with antibiotics slow improvement patient has bilateral interstitial series moderate effusions Heart failure now being seen by cardiology will continue with supportive care follow-up pulm recommendations Chronic pain syndrome pain management will continue with supportive care Chronic atrial fibrillation rate is controlled at this time Bilateral pleural effusion-left thoracentesis completed with removal of 500 mL.  Pending right thoracentesis   I have personally seen and evaluated the patient, evaluated laboratory and imaging results, formulated the assessment and plan and placed orders. The Patient requires high complexity decision making with multiple systems involvement.  Rounds were done with the Respiratory Therapy Director and Staff therapists and discussed with nursing staff also.  Allyne Gee, MD [facility] Pulmonary Critical Care Medicine Sleep Medicine

## 2022-11-26 ENCOUNTER — Other Ambulatory Visit (HOSPITAL_COMMUNITY): Payer: Medicare Other

## 2022-11-26 DIAGNOSIS — J180 Bronchopneumonia, unspecified organism: Secondary | ICD-10-CM | POA: Diagnosis not present

## 2022-11-26 DIAGNOSIS — I5043 Acute on chronic combined systolic (congestive) and diastolic (congestive) heart failure: Secondary | ICD-10-CM | POA: Diagnosis not present

## 2022-11-26 DIAGNOSIS — I482 Chronic atrial fibrillation, unspecified: Secondary | ICD-10-CM | POA: Diagnosis not present

## 2022-11-26 DIAGNOSIS — J9621 Acute and chronic respiratory failure with hypoxia: Secondary | ICD-10-CM | POA: Diagnosis not present

## 2022-11-27 DIAGNOSIS — J180 Bronchopneumonia, unspecified organism: Secondary | ICD-10-CM | POA: Diagnosis not present

## 2022-11-27 DIAGNOSIS — J9621 Acute and chronic respiratory failure with hypoxia: Secondary | ICD-10-CM | POA: Diagnosis not present

## 2022-11-27 DIAGNOSIS — I5043 Acute on chronic combined systolic (congestive) and diastolic (congestive) heart failure: Secondary | ICD-10-CM | POA: Diagnosis not present

## 2022-11-27 DIAGNOSIS — I482 Chronic atrial fibrillation, unspecified: Secondary | ICD-10-CM | POA: Diagnosis not present

## 2022-11-27 LAB — CBC WITH DIFFERENTIAL/PLATELET
Abs Immature Granulocytes: 0.03 10*3/uL (ref 0.00–0.07)
Basophils Absolute: 0 10*3/uL (ref 0.0–0.1)
Basophils Relative: 0 %
Eosinophils Absolute: 0 10*3/uL (ref 0.0–0.5)
Eosinophils Relative: 0 %
HCT: 31.2 % — ABNORMAL LOW (ref 36.0–46.0)
Hemoglobin: 9.7 g/dL — ABNORMAL LOW (ref 12.0–15.0)
Immature Granulocytes: 0 %
Lymphocytes Relative: 5 %
Lymphs Abs: 0.3 10*3/uL — ABNORMAL LOW (ref 0.7–4.0)
MCH: 30.3 pg (ref 26.0–34.0)
MCHC: 31.1 g/dL (ref 30.0–36.0)
MCV: 97.5 fL (ref 80.0–100.0)
Monocytes Absolute: 0.2 10*3/uL (ref 0.1–1.0)
Monocytes Relative: 2 %
Neutro Abs: 6.2 10*3/uL (ref 1.7–7.7)
Neutrophils Relative %: 93 %
Platelets: UNDETERMINED 10*3/uL (ref 150–400)
RBC: 3.2 MIL/uL — ABNORMAL LOW (ref 3.87–5.11)
RDW: 14.5 % (ref 11.5–15.5)
WBC: 6.7 10*3/uL (ref 4.0–10.5)
nRBC: 0 % (ref 0.0–0.2)

## 2022-11-27 LAB — COMPREHENSIVE METABOLIC PANEL
ALT: 14 U/L (ref 0–44)
AST: 22 U/L (ref 15–41)
Albumin: 2.6 g/dL — ABNORMAL LOW (ref 3.5–5.0)
Alkaline Phosphatase: 75 U/L (ref 38–126)
Anion gap: 9 (ref 5–15)
BUN: 33 mg/dL — ABNORMAL HIGH (ref 8–23)
CO2: 27 mmol/L (ref 22–32)
Calcium: 8.4 mg/dL — ABNORMAL LOW (ref 8.9–10.3)
Chloride: 99 mmol/L (ref 98–111)
Creatinine, Ser: 1.9 mg/dL — ABNORMAL HIGH (ref 0.44–1.00)
GFR, Estimated: 27 mL/min — ABNORMAL LOW (ref >60)
Glucose, Bld: 320 mg/dL — ABNORMAL HIGH (ref 70–99)
Potassium: 3.2 mmol/L — ABNORMAL LOW (ref 3.5–5.1)
Sodium: 135 mmol/L (ref 135–145)
Total Bilirubin: 0.4 mg/dL (ref 0.3–1.2)
Total Protein: 4.9 g/dL — ABNORMAL LOW (ref 6.5–8.1)

## 2022-11-27 LAB — POTASSIUM
Potassium: 3.4 mmol/L — ABNORMAL LOW (ref 3.5–5.1)
Potassium: 3.7 mmol/L (ref 3.5–5.1)

## 2022-11-27 LAB — BODY FLUID CULTURE W GRAM STAIN: Culture: NO GROWTH

## 2022-11-27 LAB — MAGNESIUM
Magnesium: 1.7 mg/dL (ref 1.7–2.4)
Magnesium: 1.7 mg/dL (ref 1.7–2.4)

## 2022-11-27 LAB — PHOSPHORUS: Phosphorus: 3 mg/dL (ref 2.5–4.6)

## 2022-11-28 DIAGNOSIS — I482 Chronic atrial fibrillation, unspecified: Secondary | ICD-10-CM | POA: Diagnosis not present

## 2022-11-28 DIAGNOSIS — I5043 Acute on chronic combined systolic (congestive) and diastolic (congestive) heart failure: Secondary | ICD-10-CM | POA: Diagnosis not present

## 2022-11-28 DIAGNOSIS — J180 Bronchopneumonia, unspecified organism: Secondary | ICD-10-CM | POA: Diagnosis not present

## 2022-11-28 DIAGNOSIS — J9621 Acute and chronic respiratory failure with hypoxia: Secondary | ICD-10-CM | POA: Diagnosis not present

## 2022-11-28 NOTE — Progress Notes (Cosign Needed)
Pulmonary [place]  PROGRESS NOTE     SEMAJ KHAM  EXB:[id number]  DOB: 1941/05/27   DOA: 11/10/2022  Referring Physician: Satira Sark, MD  HPI: Debra Ryan is a 82 y.o. female being followed for ventilator/airway/oxygen weaning Acute on Chronic Respiratory Failure.  Patient seen sitting up in bed, currently mains on 4 L nasal cannula without issue.  Was able to use BiPAP again overnight and tolerated well.  Medications: Reviewed on Rounds  Physical Exam:  Vitals: Temp 97.9, pulse 58, respirations 12, BP 164/55, SpO2 98%  Ventilator Settings 4 L nasal cannula  General: Comfortable at this time Neck: supple Cardiovascular: no malignant arrhythmias Respiratory: Bilaterally clear but diminished Skin: no rash seen on limited exam Musculoskeletal: No gross abnormality Psychiatric:unable to assess Neurologic:no involuntary movements         Lab Data:   Basic Metabolic Panel: Recent Labs  Lab 11/22/22 0326 11/24/22 0237 11/27/22 0119 11/27/22 1240 11/27/22 1639  NA 138 137 135  --   --   K 4.5 4.5 3.2* 3.4* 3.7  CL 107 102 99  --   --   CO2 '27 28 27  '$ --   --   GLUCOSE 118* 287* 320*  --   --   BUN 19 16 33*  --   --   CREATININE 1.54* 1.45* 1.90*  --   --   CALCIUM 8.4* 8.6* 8.4*  --   --   MG 2.0 1.8 1.7  --  1.7  PHOS 2.9 3.5 3.0  --   --     ABG: No results for input(s): "PHART", "PCO2ART", "PO2ART", "HCO3", "O2SAT" in the last 168 hours.  Liver Function Tests: Recent Labs  Lab 11/24/22 0237 11/27/22 0119  AST 18 22  ALT 15 14  ALKPHOS 95 75  BILITOT 0.7 0.4  PROT 5.2* 4.9*  ALBUMIN 2.6* 2.6*   No results for input(s): "LIPASE", "AMYLASE" in the last 168 hours. No results for input(s): "AMMONIA" in the last 168 hours.  CBC: Recent Labs  Lab 11/22/22 0326 11/24/22 0237 11/27/22 0119  WBC 7.2 6.8 6.7  NEUTROABS 5.4 6.1 6.2  HGB 10.7* 10.7*  9.7*  HCT 34.3* 33.8* 31.2*  MCV 98.6 98.5 97.5  PLT 193 221 PLATELET CLUMPS NOTED ON SMEAR, UNABLE TO ESTIMATE    Cardiac Enzymes: No results for input(s): "CKTOTAL", "CKMB", "CKMBINDEX", "TROPONINI" in the last 168 hours.  BNP (last 3 results) Recent Labs    08/21/22 0323 11/19/22 1617 11/20/22 0143  BNP 461.9* 512.4* 524.6*    ProBNP (last 3 results) No results for input(s): "PROBNP" in the last 8760 hours.  Radiological Exams: No results found.  Assessment/Plan Active Problems:   Acute on chronic respiratory failure with hypoxia (HCC)   Bronchopneumonia   Acute on chronic combined systolic and diastolic CHF (congestive heart failure) (HCC)   Chronic pain syndrome   Chronic atrial fibrillation with RVR (HCC)   Acute on chronic respiratory failure hypoxia-off of Oxymizer and currently on 4 L nasal cannula.  Continue titrating oxygen down as patient tolerates.  Patient is wearing BiPAP and nocturnal hours. Lobar pneumonia has been treated with antibiotics slow improvement patient has bilateral interstitial series moderate effusions Heart failure now being seen by cardiology will continue with supportive care follow-up pulm recommendations Chronic pain syndrome pain management will continue with supportive care Chronic atrial fibrillation rate is controlled at this time Bilateral pleural effusion-left thoracentesis  completed with removal of 500 mL.  Pending right thoracentesis   I have personally seen and evaluated the patient, evaluated laboratory and imaging results, formulated the assessment and plan and placed orders. The Patient requires high complexity decision making with multiple systems involvement.  Rounds were done with the Respiratory Therapy Director and Staff therapists and discussed with nursing staff also.  Allyne Gee, MD [facility] Pulmonary Critical Care Medicine Sleep Medicine

## 2022-11-28 NOTE — Progress Notes (Cosign Needed)
Pulmonary [place]  PROGRESS NOTE     MARISAH LAKER  OIN:[id number]  DOB: [birth year]   DOA: 11/09/2022  Referring Physician: Satira Sark, MD  HPI: KIMYATTA LECY is a 82 y.o. female being followed for ventilator/airway/oxygen weaning Acute on Chronic Respiratory Failure.  Patient seen sitting up in bed, currently remains on 4 L nasal cannula.  Once again did wear BiPAP overnight and tolerated very well, that is now 3 nights in a row.  Medications: Reviewed on Rounds  Physical Exam:  Vitals: Temp 97.9, pulse 71, respirations 12, BP 173/74, SpO2 100%  Ventilator Settings 4 L nasal cannula  General: Comfortable at this time Neck: supple Cardiovascular: no malignant arrhythmias Respiratory: Bilaterally clear Skin: no rash seen on limited exam Musculoskeletal: No gross abnormality Psychiatric:unable to assess Neurologic:no involuntary movements         Lab Data:   Basic Metabolic Panel: Recent Labs  Lab 11/22/22 0326 11/24/22 0237 11/27/22 0119 11/27/22 1240 11/27/22 1639  NA 138 137 135  --   --   K 4.5 4.5 3.2* 3.4* 3.7  CL 107 102 99  --   --   CO2 '27 28 27  '$ --   --   GLUCOSE 118* 287* 320*  --   --   BUN 19 16 33*  --   --   CREATININE 1.54* 1.45* 1.90*  --   --   CALCIUM 8.4* 8.6* 8.4*  --   --   MG 2.0 1.8 1.7  --  1.7  PHOS 2.9 3.5 3.0  --   --     ABG: No results for input(s): "PHART", "PCO2ART", "PO2ART", "HCO3", "O2SAT" in the last 168 hours.  Liver Function Tests: Recent Labs  Lab 11/24/22 0237 11/27/22 0119  AST 18 22  ALT 15 14  ALKPHOS 95 75  BILITOT 0.7 0.4  PROT 5.2* 4.9*  ALBUMIN 2.6* 2.6*   No results for input(s): "LIPASE", "AMYLASE" in the last 168 hours. No results for input(s): "AMMONIA" in the last 168 hours.  CBC: Recent Labs  Lab 11/22/22 0326 11/24/22 0237 11/27/22 0119  WBC 7.2 6.8 6.7  NEUTROABS 5.4 6.1 6.2  HGB 10.7*  10.7* 9.7*  HCT 34.3* 33.8* 31.2*  MCV 98.6 98.5 97.5  PLT 193 221 PLATELET CLUMPS NOTED ON SMEAR, UNABLE TO ESTIMATE    Cardiac Enzymes: No results for input(s): "CKTOTAL", "CKMB", "CKMBINDEX", "TROPONINI" in the last 168 hours.  BNP (last 3 results) Recent Labs    08/21/22 0323 11/19/22 1617 11/20/22 0143  BNP 461.9* 512.4* 524.6*    ProBNP (last 3 results) No results for input(s): "PROBNP" in the last 8760 hours.  Radiological Exams: No results found.  Assessment/Plan Active Problems:   Acute on chronic respiratory failure with hypoxia (HCC)   Bronchopneumonia   Acute on chronic combined systolic and diastolic CHF (congestive heart failure) (HCC)   Chronic pain syndrome   Chronic atrial fibrillation with RVR (HCC)   Acute on chronic respiratory failure hypoxia-off of Oxymizer and currently on 4 L nasal cannula.  Continue titrating oxygen down as patient tolerates.  Patient is wearing BiPAP and nocturnal hours. Lobar pneumonia has been treated with antibiotics slow improvement patient has bilateral interstitial series moderate effusions Heart failure now being seen by cardiology will continue with supportive care follow-up pulm recommendations Chronic pain syndrome pain management will continue with supportive care Chronic atrial fibrillation rate is controlled at this time  Bilateral pleural effusion-left thoracentesis completed with removal of 500 mL.  Pending right thoracentesis   I have personally seen and evaluated the patient, evaluated laboratory and imaging results, formulated the assessment and plan and placed orders. The Patient requires high complexity decision making with multiple systems involvement.  Rounds were done with the Respiratory Therapy Director and Staff therapists and discussed with nursing staff also.  Allyne Gee, MD [facility] Pulmonary Critical Care Medicine Sleep Medicine

## 2022-11-29 ENCOUNTER — Other Ambulatory Visit (HOSPITAL_COMMUNITY): Payer: Medicare Other

## 2022-11-29 DIAGNOSIS — J180 Bronchopneumonia, unspecified organism: Secondary | ICD-10-CM

## 2022-11-29 DIAGNOSIS — I482 Chronic atrial fibrillation, unspecified: Secondary | ICD-10-CM | POA: Diagnosis not present

## 2022-11-29 DIAGNOSIS — I5043 Acute on chronic combined systolic (congestive) and diastolic (congestive) heart failure: Secondary | ICD-10-CM | POA: Diagnosis not present

## 2022-11-29 DIAGNOSIS — J9621 Acute and chronic respiratory failure with hypoxia: Secondary | ICD-10-CM

## 2022-11-29 DIAGNOSIS — G894 Chronic pain syndrome: Secondary | ICD-10-CM

## 2022-11-29 LAB — CBC
HCT: 33 % — ABNORMAL LOW (ref 36.0–46.0)
Hemoglobin: 11.1 g/dL — ABNORMAL LOW (ref 12.0–15.0)
MCH: 31.3 pg (ref 26.0–34.0)
MCHC: 33.6 g/dL (ref 30.0–36.0)
MCV: 93 fL (ref 80.0–100.0)
Platelets: 195 10*3/uL (ref 150–400)
RBC: 3.55 MIL/uL — ABNORMAL LOW (ref 3.87–5.11)
RDW: 14 % (ref 11.5–15.5)
WBC: 12.5 10*3/uL — ABNORMAL HIGH (ref 4.0–10.5)
nRBC: 0 % (ref 0.0–0.2)

## 2022-11-29 LAB — BASIC METABOLIC PANEL
Anion gap: 9 (ref 5–15)
BUN: 45 mg/dL — ABNORMAL HIGH (ref 8–23)
CO2: 29 mmol/L (ref 22–32)
Calcium: 8.7 mg/dL — ABNORMAL LOW (ref 8.9–10.3)
Chloride: 99 mmol/L (ref 98–111)
Creatinine, Ser: 1.53 mg/dL — ABNORMAL HIGH (ref 0.44–1.00)
GFR, Estimated: 34 mL/min — ABNORMAL LOW (ref >60)
Glucose, Bld: 276 mg/dL — ABNORMAL HIGH (ref 70–99)
Potassium: 3.9 mmol/L (ref 3.5–5.1)
Sodium: 137 mmol/L (ref 135–145)

## 2022-11-29 LAB — MAGNESIUM: Magnesium: 2.2 mg/dL (ref 1.7–2.4)

## 2022-11-30 ENCOUNTER — Other Ambulatory Visit (HOSPITAL_COMMUNITY): Payer: Medicare Other

## 2022-11-30 DIAGNOSIS — J180 Bronchopneumonia, unspecified organism: Secondary | ICD-10-CM | POA: Diagnosis not present

## 2022-11-30 DIAGNOSIS — I5043 Acute on chronic combined systolic (congestive) and diastolic (congestive) heart failure: Secondary | ICD-10-CM | POA: Diagnosis not present

## 2022-11-30 DIAGNOSIS — J9621 Acute and chronic respiratory failure with hypoxia: Secondary | ICD-10-CM | POA: Diagnosis not present

## 2022-11-30 DIAGNOSIS — I482 Chronic atrial fibrillation, unspecified: Secondary | ICD-10-CM | POA: Diagnosis not present

## 2022-11-30 HISTORY — PX: IR THORACENTESIS ASP PLEURAL SPACE W/IMG GUIDE: IMG5380

## 2022-11-30 MED ORDER — LIDOCAINE HCL 1 % IJ SOLN
INTRAMUSCULAR | Status: AC
  Administered 2022-11-30: 10 mL
  Filled 2022-11-30: qty 20

## 2022-11-30 NOTE — Procedures (Signed)
PROCEDURE SUMMARY:  Successful US guided right thoracentesis. Yielded 800cc of red-tinged fluid. Pt tolerated procedure well. Air within tubing as fluid drains.  Specimen not sent for labs. CXR ordered.  EBL < 5 mL  Shae Augello PA-C 11/30/2022 12:54 PM

## 2022-12-01 ENCOUNTER — Other Ambulatory Visit (HOSPITAL_COMMUNITY): Payer: Medicare Other

## 2022-12-01 DIAGNOSIS — J9621 Acute and chronic respiratory failure with hypoxia: Secondary | ICD-10-CM | POA: Diagnosis not present

## 2022-12-01 DIAGNOSIS — I482 Chronic atrial fibrillation, unspecified: Secondary | ICD-10-CM | POA: Diagnosis not present

## 2022-12-01 DIAGNOSIS — I5043 Acute on chronic combined systolic (congestive) and diastolic (congestive) heart failure: Secondary | ICD-10-CM | POA: Diagnosis not present

## 2022-12-01 DIAGNOSIS — J180 Bronchopneumonia, unspecified organism: Secondary | ICD-10-CM | POA: Diagnosis not present

## 2022-12-01 NOTE — Progress Notes (Addendum)
Pulmonary [place]  PROGRESS NOTE     SRIHITHA Ryan  VFI:[id number]  DOB: May 07, 1941   DOA: 11/12/2022  Referring Physician: Satira Sark, MD  HPI: Debra Ryan is a 82 y.o. female being followed for ventilator/airway/oxygen weaning Acute on Chronic Respiratory Failure.  Patient seen lying in bed, currently remains on 4 L nasal cannula.  Wore BiPAP overnight again without issue.  Medications: Reviewed on Rounds  Physical Exam:  Vitals: Temp 97.3, pulse 67, respirations 14, BP 192/82, SpO2 100%  Ventilator Settings 4 L nasal cannula  General: Comfortable at this time Neck: supple Cardiovascular: no malignant arrhythmias Respiratory: Bilaterally diminished Skin: no rash seen on limited exam Musculoskeletal: No gross abnormality Psychiatric:unable to assess Neurologic:no involuntary movements         Lab Data:   Basic Metabolic Panel: Recent Labs  Lab 11/27/22 0119 11/27/22 1240 11/27/22 1639 11/29/22 0238  NA 135  --   --  137  K 3.2* 3.4* 3.7 3.9  CL 99  --   --  99  CO2 27  --   --  29  GLUCOSE 320*  --   --  276*  BUN 33*  --   --  45*  CREATININE 1.90*  --   --  1.53*  CALCIUM 8.4*  --   --  8.7*  MG 1.7  --  1.7 2.2  PHOS 3.0  --   --   --     ABG: No results for input(s): "PHART", "PCO2ART", "PO2ART", "HCO3", "O2SAT" in the last 168 hours.  Liver Function Tests: Recent Labs  Lab 11/27/22 0119  AST 22  ALT 14  ALKPHOS 75  BILITOT 0.4  PROT 4.9*  ALBUMIN 2.6*   No results for input(s): "LIPASE", "AMYLASE" in the last 168 hours. No results for input(s): "AMMONIA" in the last 168 hours.  CBC: Recent Labs  Lab 11/27/22 0119 11/29/22 0238  WBC 6.7 12.5*  NEUTROABS 6.2  --   HGB 9.7* 11.1*  HCT 31.2* 33.0*  MCV 97.5 93.0  PLT PLATELET CLUMPS NOTED ON SMEAR, UNABLE TO ESTIMATE 195    Cardiac Enzymes: No results for input(s): "CKTOTAL",  "CKMB", "CKMBINDEX", "TROPONINI" in the last 168 hours.  BNP (last 3 results) Recent Labs    08/21/22 0323 11/19/22 1617 11/20/22 0143  BNP 461.9* 512.4* 524.6*    ProBNP (last 3 results) No results for input(s): "PROBNP" in the last 8760 hours.  Radiological Exams: DG CHEST PORT 1 VIEW  Result Date: 12/01/2022 CLINICAL DATA:  Shortness of breath. EXAM: PORTABLE CHEST 1 VIEW COMPARISON:  11/30/2022 FINDINGS: The cardiac silhouette, mediastinal and hilar contours are stable. Stable small right apical pneumothorax. Stable pulmonary nodules, infiltrates and pleural effusions. IMPRESSION: 1. Stable small right apical pneumothorax. 2. Stable pulmonary nodules, infiltrates and pleural effusions. Electronically Signed   By: Marijo Sanes M.D.   On: 12/01/2022 09:04   DG Chest 1 View  Result Date: 11/30/2022 CLINICAL DATA:  Pneumothorax EXAM: CHEST  1 VIEW COMPARISON:  11/30/2022, 1:12 p.m. FINDINGS: No significant change in rotated AP portable examination. Unchanged small right apical pneumothorax, no greater than 10% in volume. Small bilateral pleural effusions, loculated appearing on the right. Diffuse bilateral interstitial pulmonary opacity. Cardiomegaly. IMPRESSION: 1. No significant change in rotated AP portable examination. Unchanged small right apical pneumothorax, no greater than 10% in volume. 2. Small bilateral pleural effusions, loculated appearing on the right. 3. Diffuse bilateral  interstitial pulmonary opacity, consistent with edema. 4. Cardiomegaly. Electronically Signed   By: Delanna Ahmadi M.D.   On: 11/30/2022 16:21   IR THORACENTESIS ASP PLEURAL SPACE W/IMG GUIDE  Result Date: 11/30/2022 INDICATION: Pleural effusions, congestive heart failure EXAM: ULTRASOUND GUIDED RIGHT THORACENTESIS MEDICATIONS: None. COMPLICATIONS: SIR LEVEL B - Normal therapy, includes overnight admission for observation. Small pneumothorax. PROCEDURE: An ultrasound guided thoracentesis was thoroughly  discussed with the patient and questions answered. The benefits, risks, alternatives and complications were also discussed. The patient understands and wishes to proceed with the procedure. Written consent was obtained. Ultrasound was performed to localize and mark an adequate pocket of fluid in the right chest. The area was then prepped and draped in the normal sterile fashion. 1% Lidocaine was used for local anesthesia. Under ultrasound guidance a 6 Fr Safe-T-Centesis catheter was introduced. Thoracentesis was performed. The catheter was removed and a dressing applied. FINDINGS: A total of approximately 800cc of red pleural fluid was removed. IMPRESSION: Successful ultrasound guided right thoracentesis yielding 800cc of pleural fluid. Read and performed by: Alexandria Lodge, PA-C Electronically Signed   By: Ruthann Cancer M.D.   On: 11/30/2022 15:16   DG Chest Port 1 View  Result Date: 11/30/2022 CLINICAL DATA:  Status post thoracentesis with removal of approximately 800 cc. EXAM: PORTABLE CHEST 1 VIEW COMPARISON:  11/29/2022 FINDINGS: Stable cardiomediastinal contours. Unchanged left pleural effusion. Right pleural effusion appears decreased from previous exam. Small right apical pneumothorax measures approximately 8 mm. Mild interstitial edema identified as before. Atelectasis noted in both lung bases. IMPRESSION: 1. Small right apical pneumothorax status post thoracentesis. 2. No change in left pleural effusion and interstitial edema. 3. Bibasilar atelectasis. These results were communicated at the time of interpretation on 11/30/2022 at 1:24 pm to provider [facility] , who verbally acknowledged these results. Electronically Signed   By: Kerby Moors M.D.   On: 11/30/2022 13:26   DG Chest 1 View  Result Date: 11/29/2022 CLINICAL DATA:  Pleural effusion EXAM: CHEST  1 VIEW COMPARISON:  11/26/2022 FINDINGS: Cardiomegaly with mild interstitial edema. Small left and moderate right pleural effusions.  IMPRESSION: Cardiomegaly with mild interstitial edema. Small left and moderate right pleural effusions. Electronically Signed   By: Julian Hy M.D.   On: 11/29/2022 23:19    Assessment/Plan Active Problems:   Acute on chronic respiratory failure with hypoxia (HCC)   Bronchopneumonia   Acute on chronic combined systolic and diastolic CHF (congestive heart failure) (HCC)   Chronic pain syndrome   Chronic atrial fibrillation with RVR (HCC)   Acute on chronic respiratory failure hypoxia-currently on 4 L nasal cannula.  Continue titrating oxygen down as patient tolerates.  Patient is wearing BiPAP at nocturnal hours. Lobar pneumonia has been treated with antibiotics slow improvement patient has bilateral interstitial series moderate effusions Heart failure now being seen by cardiology will continue with supportive care follow-up pulm recommendations Chronic pain syndrome pain management will continue with supportive care Chronic atrial fibrillation rate is controlled at this time Bilateral pleural effusion-left thoracentesis completed with removal of 500 mL.  Pending right thoracentesis   I have personally seen and evaluated the patient, evaluated laboratory and imaging results, formulated the assessment and plan and placed orders. The Patient requires high complexity decision making with multiple systems involvement.  Rounds were done with the Respiratory Therapy Director and Staff therapists and discussed with nursing staff also.  Allyne Gee, MD [facility] Pulmonary Critical Care Medicine Sleep Medicine

## 2022-12-01 NOTE — Progress Notes (Addendum)
Pulmonary [place]  PROGRESS NOTE     Debra Ryan  TDD:[id number]  DOB: 12-01-1940   DOA: 11/02/2022  Referring Physician: Satira Sark, MD  HPI: Debra Ryan is a 82 y.o. female being followed for ventilator/airway/oxygen weaning Acute on Chronic Respiratory Failure.  Patient seen lying in bed, currently remains on 4 L nasal cannula during the day without issue.  Continues to wear her BiPAP with settings of 14/5 FiO2 40% overnight.  Medications: Reviewed on Rounds  Physical Exam:  Vitals: Temp 97.3, pulse 71, respirations 28, BP 165/70, SpO2 100%  Ventilator Settings 4 L nasal cannula  General: Comfortable at this time Neck: supple Cardiovascular: no malignant arrhythmias Respiratory: Bilaterally diminished Skin: no rash seen on limited exam Musculoskeletal: No gross abnormality Psychiatric:unable to assess Neurologic:no involuntary movements         Lab Data:   Basic Metabolic Panel: Recent Labs  Lab 11/27/22 0119 11/27/22 1240 11/27/22 1639 11/29/22 0238  NA 135  --   --  137  K 3.2* 3.4* 3.7 3.9  CL 99  --   --  99  CO2 27  --   --  29  GLUCOSE 320*  --   --  276*  BUN 33*  --   --  45*  CREATININE 1.90*  --   --  1.53*  CALCIUM 8.4*  --   --  8.7*  MG 1.7  --  1.7 2.2  PHOS 3.0  --   --   --     ABG: No results for input(s): "PHART", "PCO2ART", "PO2ART", "HCO3", "O2SAT" in the last 168 hours.  Liver Function Tests: Recent Labs  Lab 11/27/22 0119  AST 22  ALT 14  ALKPHOS 75  BILITOT 0.4  PROT 4.9*  ALBUMIN 2.6*   No results for input(s): "LIPASE", "AMYLASE" in the last 168 hours. No results for input(s): "AMMONIA" in the last 168 hours.  CBC: Recent Labs  Lab 11/27/22 0119 11/29/22 0238  WBC 6.7 12.5*  NEUTROABS 6.2  --   HGB 9.7* 11.1*  HCT 31.2* 33.0*  MCV 97.5 93.0  PLT PLATELET CLUMPS NOTED ON SMEAR, UNABLE TO ESTIMATE 195     Cardiac Enzymes: No results for input(s): "CKTOTAL", "CKMB", "CKMBINDEX", "TROPONINI" in the last 168 hours.  BNP (last 3 results) Recent Labs    08/21/22 0323 11/19/22 1617 11/20/22 0143  BNP 461.9* 512.4* 524.6*    ProBNP (last 3 results) No results for input(s): "PROBNP" in the last 8760 hours.  Radiological Exams: DG CHEST PORT 1 VIEW  Result Date: 12/01/2022 CLINICAL DATA:  Shortness of breath. EXAM: PORTABLE CHEST 1 VIEW COMPARISON:  11/30/2022 FINDINGS: The cardiac silhouette, mediastinal and hilar contours are stable. Stable small right apical pneumothorax. Stable pulmonary nodules, infiltrates and pleural effusions. IMPRESSION: 1. Stable small right apical pneumothorax. 2. Stable pulmonary nodules, infiltrates and pleural effusions. Electronically Signed   By: Marijo Sanes M.D.   On: 12/01/2022 09:04   DG Chest 1 View  Result Date: 11/30/2022 CLINICAL DATA:  Pneumothorax EXAM: CHEST  1 VIEW COMPARISON:  11/30/2022, 1:12 p.m. FINDINGS: No significant change in rotated AP portable examination. Unchanged small right apical pneumothorax, no greater than 10% in volume. Small bilateral pleural effusions, loculated appearing on the right. Diffuse bilateral interstitial pulmonary opacity. Cardiomegaly. IMPRESSION: 1. No significant change in rotated AP portable examination. Unchanged small right apical pneumothorax, no greater than 10% in volume. 2. Small  bilateral pleural effusions, loculated appearing on the right. 3. Diffuse bilateral interstitial pulmonary opacity, consistent with edema. 4. Cardiomegaly. Electronically Signed   By: Delanna Ahmadi M.D.   On: 11/30/2022 16:21   IR THORACENTESIS ASP PLEURAL SPACE W/IMG GUIDE  Result Date: 11/30/2022 INDICATION: Pleural effusions, congestive heart failure EXAM: ULTRASOUND GUIDED RIGHT THORACENTESIS MEDICATIONS: None. COMPLICATIONS: SIR LEVEL B - Normal therapy, includes overnight admission for observation. Small pneumothorax.  PROCEDURE: An ultrasound guided thoracentesis was thoroughly discussed with the patient and questions answered. The benefits, risks, alternatives and complications were also discussed. The patient understands and wishes to proceed with the procedure. Written consent was obtained. Ultrasound was performed to localize and mark an adequate pocket of fluid in the right chest. The area was then prepped and draped in the normal sterile fashion. 1% Lidocaine was used for local anesthesia. Under ultrasound guidance a 6 Fr Safe-T-Centesis catheter was introduced. Thoracentesis was performed. The catheter was removed and a dressing applied. FINDINGS: A total of approximately 800cc of red pleural fluid was removed. IMPRESSION: Successful ultrasound guided right thoracentesis yielding 800cc of pleural fluid. Read and performed by: Alexandria Lodge, PA-C Electronically Signed   By: Ruthann Cancer M.D.   On: 11/30/2022 15:16   DG Chest Port 1 View  Result Date: 11/30/2022 CLINICAL DATA:  Status post thoracentesis with removal of approximately 800 cc. EXAM: PORTABLE CHEST 1 VIEW COMPARISON:  11/29/2022 FINDINGS: Stable cardiomediastinal contours. Unchanged left pleural effusion. Right pleural effusion appears decreased from previous exam. Small right apical pneumothorax measures approximately 8 mm. Mild interstitial edema identified as before. Atelectasis noted in both lung bases. IMPRESSION: 1. Small right apical pneumothorax status post thoracentesis. 2. No change in left pleural effusion and interstitial edema. 3. Bibasilar atelectasis. These results were communicated at the time of interpretation on 11/30/2022 at 1:24 pm to provider [facility] , who verbally acknowledged these results. Electronically Signed   By: Kerby Moors M.D.   On: 11/30/2022 13:26   DG Chest 1 View  Result Date: 11/29/2022 CLINICAL DATA:  Pleural effusion EXAM: CHEST  1 VIEW COMPARISON:  11/26/2022 FINDINGS: Cardiomegaly with mild  interstitial edema. Small left and moderate right pleural effusions. IMPRESSION: Cardiomegaly with mild interstitial edema. Small left and moderate right pleural effusions. Electronically Signed   By: Julian Hy M.D.   On: 11/29/2022 23:19    Assessment/Plan Active Problems:   Acute on chronic respiratory failure with hypoxia (HCC)   Bronchopneumonia   Acute on chronic combined systolic and diastolic CHF (congestive heart failure) (HCC)   Chronic pain syndrome   Chronic atrial fibrillation with RVR (HCC)   Acute on chronic respiratory failure hypoxia-currently on 4 L nasal cannula.  Continue titrating oxygen down as patient tolerates.  Patient is wearing BiPAP at nocturnal hours. Lobar pneumonia has been treated with antibiotics slow improvement patient has bilateral interstitial series moderate effusions Heart failure now being seen by cardiology will continue with supportive care follow-up pulm recommendations Chronic pain syndrome pain management will continue with supportive care Chronic atrial fibrillation rate is controlled at this time Bilateral pleural effusion-left thoracentesis completed with removal of 500 mL.  Pending right thoracentesis   I have personally seen and evaluated the patient, evaluated laboratory and imaging results, formulated the assessment and plan and placed orders. The Patient requires high complexity decision making with multiple systems involvement.  Rounds were done with the Respiratory Therapy Director and Staff therapists and discussed with nursing staff also.  Allyne Gee, MD [facility]  Pulmonary Critical Care Medicine Sleep Medicine

## 2022-12-01 NOTE — Progress Notes (Signed)
Pulmonary [place]  PROGRESS NOTE     Debra Ryan  WJX:[id number]  DOB: 1940/11/25   DOA: 11/25/2022  Referring Physician: Satira Sark, MD  HPI: Debra Ryan is a 82 y.o. female being followed for ventilator/airway/oxygen weaning Acute on Chronic Respiratory Failure.  Patient is on 3 L oxygen using BiPAP at nighttime appears to be doing fairly well tolerating  Medications: Reviewed on Rounds  Physical Exam:  Vitals: Temperature is 97.3 pulse 97 respiratory is 12 blood pressure 147/77 saturation is 99%  Ventilator Settings currently off the BiPAP  General: Comfortable at this time Neck: supple Cardiovascular: no malignant arrhythmias Respiratory: No rhonchi no rales are noted Skin: no rash seen on limited exam Musculoskeletal: No gross abnormality Psychiatric:unable to assess Neurologic:no involuntary movements         Lab Data:   Basic Metabolic Panel: Recent Labs  Lab 11/27/22 0119 11/27/22 1240 11/27/22 1639 11/29/22 0238  NA 135  --   --  137  K 3.2* 3.4* 3.7 3.9  CL 99  --   --  99  CO2 27  --   --  29  GLUCOSE 320*  --   --  276*  BUN 33*  --   --  45*  CREATININE 1.90*  --   --  1.53*  CALCIUM 8.4*  --   --  8.7*  MG 1.7  --  1.7 2.2  PHOS 3.0  --   --   --     ABG: No results for input(s): "PHART", "PCO2ART", "PO2ART", "HCO3", "O2SAT" in the last 168 hours.  Liver Function Tests: Recent Labs  Lab 11/27/22 0119  AST 22  ALT 14  ALKPHOS 75  BILITOT 0.4  PROT 4.9*  ALBUMIN 2.6*   No results for input(s): "LIPASE", "AMYLASE" in the last 168 hours. No results for input(s): "AMMONIA" in the last 168 hours.  CBC: Recent Labs  Lab 11/27/22 0119 11/29/22 0238  WBC 6.7 12.5*  NEUTROABS 6.2  --   HGB 9.7* 11.1*  HCT 31.2* 33.0*  MCV 97.5 93.0  PLT PLATELET CLUMPS NOTED ON SMEAR, UNABLE TO ESTIMATE 195    Cardiac Enzymes: No results for  input(s): "CKTOTAL", "CKMB", "CKMBINDEX", "TROPONINI" in the last 168 hours.  BNP (last 3 results) Recent Labs    08/21/22 0323 11/19/22 1617 11/20/22 0143  BNP 461.9* 512.4* 524.6*    ProBNP (last 3 results) No results for input(s): "PROBNP" in the last 8760 hours.  Radiological Exams: DG CHEST PORT 1 VIEW  Result Date: 12/01/2022 CLINICAL DATA:  Shortness of breath. EXAM: PORTABLE CHEST 1 VIEW COMPARISON:  11/30/2022 FINDINGS: The cardiac silhouette, mediastinal and hilar contours are stable. Stable small right apical pneumothorax. Stable pulmonary nodules, infiltrates and pleural effusions. IMPRESSION: 1. Stable small right apical pneumothorax. 2. Stable pulmonary nodules, infiltrates and pleural effusions. Electronically Signed   By: Marijo Sanes M.D.   On: 12/01/2022 09:04   DG Chest 1 View  Result Date: 11/30/2022 CLINICAL DATA:  Pneumothorax EXAM: CHEST  1 VIEW COMPARISON:  11/30/2022, 1:12 p.m. FINDINGS: No significant change in rotated AP portable examination. Unchanged small right apical pneumothorax, no greater than 10% in volume. Small bilateral pleural effusions, loculated appearing on the right. Diffuse bilateral interstitial pulmonary opacity. Cardiomegaly. IMPRESSION: 1. No significant change in rotated AP portable examination. Unchanged small right apical pneumothorax, no greater than 10% in volume. 2. Small bilateral pleural effusions, loculated appearing  on the right. 3. Diffuse bilateral interstitial pulmonary opacity, consistent with edema. 4. Cardiomegaly. Electronically Signed   By: Delanna Ahmadi M.D.   On: 11/30/2022 16:21   IR THORACENTESIS ASP PLEURAL SPACE W/IMG GUIDE  Result Date: 11/30/2022 INDICATION: Pleural effusions, congestive heart failure EXAM: ULTRASOUND GUIDED RIGHT THORACENTESIS MEDICATIONS: None. COMPLICATIONS: SIR LEVEL B - Normal therapy, includes overnight admission for observation. Small pneumothorax. PROCEDURE: An ultrasound guided thoracentesis  was thoroughly discussed with the patient and questions answered. The benefits, risks, alternatives and complications were also discussed. The patient understands and wishes to proceed with the procedure. Written consent was obtained. Ultrasound was performed to localize and mark an adequate pocket of fluid in the right chest. The area was then prepped and draped in the normal sterile fashion. 1% Lidocaine was used for local anesthesia. Under ultrasound guidance a 6 Fr Safe-T-Centesis catheter was introduced. Thoracentesis was performed. The catheter was removed and a dressing applied. FINDINGS: A total of approximately 800cc of red pleural fluid was removed. IMPRESSION: Successful ultrasound guided right thoracentesis yielding 800cc of pleural fluid. Read and performed by: Alexandria Lodge, PA-C Electronically Signed   By: Ruthann Cancer M.D.   On: 11/30/2022 15:16   DG Chest Port 1 View  Result Date: 11/30/2022 CLINICAL DATA:  Status post thoracentesis with removal of approximately 800 cc. EXAM: PORTABLE CHEST 1 VIEW COMPARISON:  11/29/2022 FINDINGS: Stable cardiomediastinal contours. Unchanged left pleural effusion. Right pleural effusion appears decreased from previous exam. Small right apical pneumothorax measures approximately 8 mm. Mild interstitial edema identified as before. Atelectasis noted in both lung bases. IMPRESSION: 1. Small right apical pneumothorax status post thoracentesis. 2. No change in left pleural effusion and interstitial edema. 3. Bibasilar atelectasis. These results were communicated at the time of interpretation on 11/30/2022 at 1:24 pm to provider [facility] , who verbally acknowledged these results. Electronically Signed   By: Kerby Moors M.D.   On: 11/30/2022 13:26   DG Chest 1 View  Result Date: 11/29/2022 CLINICAL DATA:  Pleural effusion EXAM: CHEST  1 VIEW COMPARISON:  11/26/2022 FINDINGS: Cardiomegaly with mild interstitial edema. Small left and moderate right  pleural effusions. IMPRESSION: Cardiomegaly with mild interstitial edema. Small left and moderate right pleural effusions. Electronically Signed   By: Julian Hy M.D.   On: 11/29/2022 23:19    Assessment/Plan Active Problems:   Acute on chronic respiratory failure with hypoxia (HCC)   Bronchopneumonia   Acute on chronic combined systolic and diastolic CHF (congestive heart failure) (HCC)   Chronic pain syndrome   Chronic atrial fibrillation with RVR (HCC)   Acute on chronic respiratory failure hypoxia will continue with using BiPAP at nighttime Continue secretion management pulmonary toilet. Bronchopneumonia supportive care will continue to follow along closely Chronic pain syndrome pain management Chronic atrial fibrillation rate is controlled Combined systolic diastolic dysfunction prognosis guarded continue to monitor fluid status   I have personally seen and evaluated the patient, evaluated laboratory and imaging results, formulated the assessment and plan and placed orders. The Patient requires high complexity decision making with multiple systems involvement.  Rounds were done with the Respiratory Therapy Director and Staff therapists and discussed with nursing staff also.  Allyne Gee, MD [facility] Pulmonary Critical Care Medicine Sleep Medicine

## 2022-12-01 DEATH — deceased

## 2022-12-02 ENCOUNTER — Other Ambulatory Visit (HOSPITAL_COMMUNITY): Payer: Medicare Other

## 2022-12-02 DIAGNOSIS — I482 Chronic atrial fibrillation, unspecified: Secondary | ICD-10-CM | POA: Diagnosis not present

## 2022-12-02 DIAGNOSIS — I5043 Acute on chronic combined systolic (congestive) and diastolic (congestive) heart failure: Secondary | ICD-10-CM | POA: Diagnosis not present

## 2022-12-02 DIAGNOSIS — J180 Bronchopneumonia, unspecified organism: Secondary | ICD-10-CM | POA: Diagnosis not present

## 2022-12-02 DIAGNOSIS — J9621 Acute and chronic respiratory failure with hypoxia: Secondary | ICD-10-CM | POA: Diagnosis not present

## 2022-12-02 LAB — CBC
HCT: 36.9 % (ref 36.0–46.0)
Hemoglobin: 11.9 g/dL — ABNORMAL LOW (ref 12.0–15.0)
MCH: 30.9 pg (ref 26.0–34.0)
MCHC: 32.2 g/dL (ref 30.0–36.0)
MCV: 95.8 fL (ref 80.0–100.0)
Platelets: 192 10*3/uL (ref 150–400)
RBC: 3.85 MIL/uL — ABNORMAL LOW (ref 3.87–5.11)
RDW: 14.3 % (ref 11.5–15.5)
WBC: 12.8 10*3/uL — ABNORMAL HIGH (ref 4.0–10.5)
nRBC: 0 % (ref 0.0–0.2)

## 2022-12-02 LAB — MISC LABCORP TEST (SEND OUT): Labcorp test code: 9985

## 2022-12-02 LAB — BASIC METABOLIC PANEL
Anion gap: 8 (ref 5–15)
BUN: 36 mg/dL — ABNORMAL HIGH (ref 8–23)
CO2: 29 mmol/L (ref 22–32)
Calcium: 8 mg/dL — ABNORMAL LOW (ref 8.9–10.3)
Chloride: 99 mmol/L (ref 98–111)
Creatinine, Ser: 1.3 mg/dL — ABNORMAL HIGH (ref 0.44–1.00)
GFR, Estimated: 42 mL/min — ABNORMAL LOW (ref >60)
Glucose, Bld: 99 mg/dL (ref 70–99)
Potassium: 3.6 mmol/L (ref 3.5–5.1)
Sodium: 136 mmol/L (ref 135–145)

## 2022-12-02 LAB — TROPONIN I (HIGH SENSITIVITY): Troponin I (High Sensitivity): 52 ng/L — ABNORMAL HIGH (ref <18)

## 2022-12-02 NOTE — Progress Notes (Signed)
Pulmonary [place]  PROGRESS NOTE     Debra Ryan  [id number]:[id number]  DOB: 11-21-1940   DOA: 11/20/2022  Referring Physician: Satira Sark, MD  HPI: Debra Ryan is a 82 y.o. female being followed for ventilator/airway/oxygen weaning Acute on Chronic Respiratory Failure. ***  Medications: Reviewed on Rounds  Physical Exam:  Vitals: ***  Ventilator Settings ***  General: Comfortable at this time Neck: supple Cardiovascular: no malignant arrhythmias Respiratory: *** Skin: no rash seen on limited exam Musculoskeletal: No gross abnormality Psychiatric:unable to assess Neurologic:no involuntary movements         Lab Data:   Basic Metabolic Panel: Recent Labs  Lab 11/27/22 0119 11/27/22 1240 11/27/22 1639 11/29/22 0238 12/02/22 1626  NA 135  --   --  137 136  K 3.2* 3.4* 3.7 3.9 3.6  CL 99  --   --  99 99  CO2 27  --   --  29 29  GLUCOSE 320*  --   --  276* 99  BUN 33*  --   --  45* 36*  CREATININE 1.90*  --   --  1.53* 1.30*  CALCIUM 8.4*  --   --  8.7* 8.0*  MG 1.7  --  1.7 2.2  --   PHOS 3.0  --   --   --   --     ABG: No results for input(s): "PHART", "PCO2ART", "PO2ART", "HCO3", "O2SAT" in the last 168 hours.  Liver Function Tests: Recent Labs  Lab 11/27/22 0119  AST 22  ALT 14  ALKPHOS 75  BILITOT 0.4  PROT 4.9*  ALBUMIN 2.6*   No results for input(s): "LIPASE", "AMYLASE" in the last 168 hours. No results for input(s): "AMMONIA" in the last 168 hours.  CBC: Recent Labs  Lab 11/27/22 0119 11/29/22 0238 12/02/22 1626  WBC 6.7 12.5* 12.8*  NEUTROABS 6.2  --   --   HGB 9.7* 11.1* 11.9*  HCT 31.2* 33.0* 36.9  MCV 97.5 93.0 95.8  PLT PLATELET CLUMPS NOTED ON SMEAR, UNABLE TO ESTIMATE 195 192    Cardiac Enzymes: No results for input(s): "CKTOTAL", "CKMB", "CKMBINDEX", "TROPONINI" in the last 168 hours.  BNP (last 3 results) Recent Labs     08/21/22 0323 11/19/22 1617 11/20/22 0143  BNP 461.9* 512.4* 524.6*    ProBNP (last 3 results) No results for input(s): "PROBNP" in the last 8760 hours.  Radiological Exams: DG CHEST PORT 1 VIEW  Result Date: 12/02/2022 CLINICAL DATA:  Shortness of breath EXAM: PORTABLE CHEST 1 VIEW COMPARISON:  12/01/2022 FINDINGS: Bilateral interstitial and patchy alveolar airspace opacities. Small bilateral pleural effusions. No pneumothorax. Stable cardiomediastinal silhouette. No aggressive osseous lesion. IMPRESSION: 1. Findings concerning for pulmonary edema versus multilobar pneumonia. Electronically Signed   By: Kathreen Devoid M.D.   On: 12/02/2022 16:05   DG CHEST PORT 1 VIEW  Result Date: 12/01/2022 CLINICAL DATA:  Shortness of breath. EXAM: PORTABLE CHEST 1 VIEW COMPARISON:  11/30/2022 FINDINGS: The cardiac silhouette, mediastinal and hilar contours are stable. Stable small right apical pneumothorax. Stable pulmonary nodules, infiltrates and pleural effusions. IMPRESSION: 1. Stable small right apical pneumothorax. 2. Stable pulmonary nodules, infiltrates and pleural effusions. Electronically Signed   By: Marijo Sanes M.D.   On: 12/01/2022 09:04    Assessment/Plan Active Problems:   Acute on chronic respiratory failure with hypoxia (HCC)   Bronchopneumonia   Acute on chronic combined systolic and diastolic CHF (  congestive heart failure) (HCC)   Chronic pain syndrome   Chronic atrial fibrillation with RVR (HCC)   *** *** *** *** ***   I have personally seen and evaluated the patient, evaluated laboratory and imaging results, formulated the assessment and plan and placed orders. The Patient requires high complexity decision making with multiple systems involvement.  Rounds were done with the Respiratory Therapy Director and Staff therapists and discussed with nursing staff also.  Allyne Gee, MD [facility] Pulmonary Critical Care Medicine Sleep Medicine

## 2022-12-03 ENCOUNTER — Other Ambulatory Visit (HOSPITAL_COMMUNITY): Payer: Medicare Other

## 2022-12-03 DIAGNOSIS — J180 Bronchopneumonia, unspecified organism: Secondary | ICD-10-CM | POA: Diagnosis not present

## 2022-12-03 DIAGNOSIS — I482 Chronic atrial fibrillation, unspecified: Secondary | ICD-10-CM | POA: Diagnosis not present

## 2022-12-03 DIAGNOSIS — I5043 Acute on chronic combined systolic (congestive) and diastolic (congestive) heart failure: Secondary | ICD-10-CM | POA: Diagnosis not present

## 2022-12-03 DIAGNOSIS — J9621 Acute and chronic respiratory failure with hypoxia: Secondary | ICD-10-CM | POA: Diagnosis not present

## 2022-12-03 LAB — CBC
HCT: 34.3 % — ABNORMAL LOW (ref 36.0–46.0)
Hemoglobin: 10.7 g/dL — ABNORMAL LOW (ref 12.0–15.0)
MCH: 30.8 pg (ref 26.0–34.0)
MCHC: 31.2 g/dL (ref 30.0–36.0)
MCV: 98.8 fL (ref 80.0–100.0)
Platelets: 161 10*3/uL (ref 150–400)
RBC: 3.47 MIL/uL — ABNORMAL LOW (ref 3.87–5.11)
RDW: 14.4 % (ref 11.5–15.5)
WBC: 8.5 10*3/uL (ref 4.0–10.5)
nRBC: 0 % (ref 0.0–0.2)

## 2022-12-03 LAB — BASIC METABOLIC PANEL
Anion gap: 5 (ref 5–15)
BUN: 37 mg/dL — ABNORMAL HIGH (ref 8–23)
CO2: 28 mmol/L (ref 22–32)
Calcium: 7.7 mg/dL — ABNORMAL LOW (ref 8.9–10.3)
Chloride: 103 mmol/L (ref 98–111)
Creatinine, Ser: 1.27 mg/dL — ABNORMAL HIGH (ref 0.44–1.00)
GFR, Estimated: 43 mL/min — ABNORMAL LOW (ref >60)
Glucose, Bld: 87 mg/dL (ref 70–99)
Potassium: 4.1 mmol/L (ref 3.5–5.1)
Sodium: 136 mmol/L (ref 135–145)

## 2022-12-03 LAB — MAGNESIUM: Magnesium: 2.2 mg/dL (ref 1.7–2.4)

## 2022-12-03 NOTE — Progress Notes (Signed)
Pulmonary [place]  PROGRESS NOTE     RIELYN KRUPINSKI  BZJ:[id number]  DOB: 07-14-1941   DOA: 11/08/2022  Referring Physician: Satira Sark, MD  HPI: Debra Ryan is a 82 y.o. female being followed for ventilator/airway/oxygen weaning Acute on Chronic Respiratory Failure.  Patient is off the ventilator on 3 L nasal cannula looks good  Medications: Reviewed on Rounds  Physical Exam:  Vitals: Temperature is 97.2 pulse 101 respiratory rate is 17 blood pressure is 146/76 saturation is 100%  Ventilator Settings 3 L nasal cannula  General: Comfortable at this time Neck: supple Cardiovascular: no malignant arrhythmias Respiratory: No rhonchi no rales are noted at this time Skin: no rash seen on limited exam Musculoskeletal: No gross abnormality Psychiatric:unable to assess Neurologic:no involuntary movements         Lab Data:   Basic Metabolic Panel: Recent Labs  Lab 11/27/22 0119 11/27/22 1240 11/27/22 1639 11/29/22 0238 12/02/22 1626 12/03/22 0131  NA 135  --   --  137 136 136  K 3.2* 3.4* 3.7 3.9 3.6 4.1  CL 99  --   --  99 99 103  CO2 27  --   --  '29 29 28  '$ GLUCOSE 320*  --   --  276* 99 87  BUN 33*  --   --  45* 36* 37*  CREATININE 1.90*  --   --  1.53* 1.30* 1.27*  CALCIUM 8.4*  --   --  8.7* 8.0* 7.7*  MG 1.7  --  1.7 2.2  --  2.2  PHOS 3.0  --   --   --   --   --     ABG: No results for input(s): "PHART", "PCO2ART", "PO2ART", "HCO3", "O2SAT" in the last 168 hours.  Liver Function Tests: Recent Labs  Lab 11/27/22 0119  AST 22  ALT 14  ALKPHOS 75  BILITOT 0.4  PROT 4.9*  ALBUMIN 2.6*   No results for input(s): "LIPASE", "AMYLASE" in the last 168 hours. No results for input(s): "AMMONIA" in the last 168 hours.  CBC: Recent Labs  Lab 11/27/22 0119 11/29/22 0238 12/02/22 1626 12/03/22 0131  WBC 6.7 12.5* 12.8* 8.5  NEUTROABS 6.2  --   --   --    HGB 9.7* 11.1* 11.9* 10.7*  HCT 31.2* 33.0* 36.9 34.3*  MCV 97.5 93.0 95.8 98.8  PLT PLATELET CLUMPS NOTED ON SMEAR, UNABLE TO ESTIMATE 195 192 161    Cardiac Enzymes: No results for input(s): "CKTOTAL", "CKMB", "CKMBINDEX", "TROPONINI" in the last 168 hours.  BNP (last 3 results) Recent Labs    08/21/22 0323 11/19/22 1617 11/20/22 0143  BNP 461.9* 512.4* 524.6*    ProBNP (last 3 results) No results for input(s): "PROBNP" in the last 8760 hours.  Radiological Exams: CT CHEST WO CONTRAST  Result Date: 12/03/2022 CLINICAL DATA:  3 cm nodular density projecting over the left lung base on plain film. History of ampullary carcinoma. * Tracking Code: BO * EXAM: CT CHEST WITHOUT CONTRAST TECHNIQUE: Multidetector CT imaging of the chest was performed following the standard protocol without IV contrast. RADIATION DOSE REDUCTION: This exam was performed according to the departmental dose-optimization program which includes automated exposure control, adjustment of the mA and/or kV according to patient size and/or use of iterative reconstruction technique. COMPARISON:  Plain film of earlier today. CT of 11/14/2022, from [facility] FINDINGS: Cardiovascular: Mild cardiomegaly with small pericardial effusion. Aortic  atherosclerosis. Left main and 3 vessel coronary artery calcification. Pulmonary artery enlargement, outflow tract 3.2 cm. Mediastinum/Nodes: No mediastinal adenopathy. Hilar regions poorly evaluated without intravenous contrast. Lungs/Pleura: Moderate right and small left pleural effusions, similar. Similarly compressed airways to the lung bases. Persistent, right greater than left septal thickening, primarily smooth. Bilateral pulmonary nodules consistent with metastatic disease. Index right upper lobe 11 mm nodule on 39/5 measures 10 mm on the prior. Posterior right upper lobe 15 mm nodule on 35/5 measured 14 mm on the prior. More inferior right upper lobe nodule measures 2.1 x 1.8  cm on 49/5 versus similar on the prior CT. Worsened right and similar left base airspace disease. Upper Abdomen: Dependent gallstones. Normal imaged portions of the spleen, stomach, adrenal glands, kidneys, liver. Pneumobilia is likely iatrogenic. Atrophic pancreas. Musculoskeletal: Progressive anasarca. IMPRESSION: 1. Pulmonary metastasis, similar to mildly progressive compared to 11/14/2022 chest CT. 2. Bilateral pleural effusions with bibasilar collapse/consolidation, most likely atelectasis. 3. Diffuse septal thickening, primarily felt to be related to pulmonary edema. Lymphangitic tumor spread could look similar. 4. Cardiomegaly with small pericardial effusion and progressive anasarca 5. Cholelithiasis 6. Coronary artery atherosclerosis. Aortic Atherosclerosis (ICD10-I70.0). 7. Pulmonary artery enlargement suggests pulmonary arterial hypertension. Electronically Signed   By: Abigail Miyamoto M.D.   On: 12/03/2022 13:59   DG CHEST PORT 1 VIEW  Result Date: 12/03/2022 CLINICAL DATA:  Pneumothorax. EXAM: PORTABLE CHEST 1 VIEW COMPARISON:  12/02/2022 FINDINGS: 0634 hours. Tiny right apical pneumothorax is similar to prior. The cardio pericardial silhouette is enlarged. There is pulmonary vascular congestion. Diffuse interstitial opacity likely chronic although superimposed interstitial edema not excluded. 3.1 cm nodular density overlies the left lung base. There is bibasilar collapse/consolidation with small to moderate bilateral pleural effusions, similar to prior. IMPRESSION: 1. Tiny right apical pneumothorax, similar to prior. 2. Bibasilar collapse/consolidation with small to moderate bilateral pleural effusions. 3. 3.1 cm nodular density overlies the left lung base. CT chest recommended to further evaluate. Electronically Signed   By: Misty Stanley M.D.   On: 12/03/2022 07:34   DG CHEST PORT 1 VIEW  Result Date: 12/02/2022 CLINICAL DATA:  Shortness of breath EXAM: PORTABLE CHEST 1 VIEW COMPARISON:  12/01/2022  FINDINGS: Bilateral interstitial and patchy alveolar airspace opacities. Small bilateral pleural effusions. No pneumothorax. Stable cardiomediastinal silhouette. No aggressive osseous lesion. IMPRESSION: 1. Findings concerning for pulmonary edema versus multilobar pneumonia. Electronically Signed   By: Kathreen Devoid M.D.   On: 12/02/2022 16:05    Assessment/Plan Active Problems:   Acute on chronic respiratory failure with hypoxia (HCC)   Bronchopneumonia   Acute on chronic combined systolic and diastolic CHF (congestive heart failure) (HCC)   Chronic pain syndrome   Chronic atrial fibrillation with RVR (HCC)   Acute on chronic respiratory failure with hypoxia will continue with oxygen therapy titrate down as tolerated continue secretion management pulmonary toilet Bronchopneumonia treated we will continue to follow-up x-rays chest x-ray showing some pulmonary edema Chronic pain syndrome pain management will continue to follow along Chronic atrial fibrillation rate is controlled Combined systolic diastolic heart failure no change will continue with supportive care   I have personally seen and evaluated the patient, evaluated laboratory and imaging results, formulated the assessment and plan and placed orders. The Patient requires high complexity decision making with multiple systems involvement.  Rounds were done with the Respiratory Therapy Director and Staff therapists and discussed with nursing staff also.  Allyne Gee, MD [facility] Pulmonary Critical Care Medicine Sleep Medicine

## 2022-12-05 DIAGNOSIS — J9621 Acute and chronic respiratory failure with hypoxia: Secondary | ICD-10-CM | POA: Diagnosis not present

## 2022-12-05 DIAGNOSIS — I482 Chronic atrial fibrillation, unspecified: Secondary | ICD-10-CM | POA: Diagnosis not present

## 2022-12-05 DIAGNOSIS — J180 Bronchopneumonia, unspecified organism: Secondary | ICD-10-CM | POA: Diagnosis not present

## 2022-12-05 DIAGNOSIS — I5043 Acute on chronic combined systolic (congestive) and diastolic (congestive) heart failure: Secondary | ICD-10-CM | POA: Diagnosis not present

## 2022-12-05 LAB — CYTOLOGY - NON PAP

## 2022-12-05 NOTE — Progress Notes (Signed)
Pulmonary [place]  PROGRESS NOTE     KATALIN COLLEDGE  PRF:[id number]  DOB: Apr 14, 1941   DOA: 10/31/2022  Referring Physician: Satira Sark, MD  HPI: Debra Ryan is a 82 y.o. female being followed for ventilator/airway/oxygen weaning Acute on Chronic Respiratory Failure.  Patient has been requiring BiPAP for saturations.  CT scan was done yesterday shows metastatic disease which is unchanged from scan of January which was done at the other facility.  Medications: Reviewed on Rounds  Physical Exam:  Vitals: Temperature is 96.7 pulse 92 respiratory rate is 12 blood pressure 102/60 saturation is 100%  Ventilator Settings currently is on BiPAP  General: Comfortable at this time Neck: supple Cardiovascular: no malignant arrhythmias Respiratory: Coarse rhonchi are noted bilaterally Skin: no rash seen on limited exam Musculoskeletal: No gross abnormality Psychiatric:unable to assess Neurologic:no involuntary movements         Lab Data:   Basic Metabolic Panel: Recent Labs  Lab 11/29/22 0238 12/02/22 1626 12/03/22 0131  NA 137 136 136  K 3.9 3.6 4.1  CL 99 99 103  CO2 '29 29 28  '$ GLUCOSE 276* 99 87  BUN 45* 36* 37*  CREATININE 1.53* 1.30* 1.27*  CALCIUM 8.7* 8.0* 7.7*  MG 2.2  --  2.2    ABG: No results for input(s): "PHART", "PCO2ART", "PO2ART", "HCO3", "O2SAT" in the last 168 hours.  Liver Function Tests: No results for input(s): "AST", "ALT", "ALKPHOS", "BILITOT", "PROT", "ALBUMIN" in the last 168 hours. No results for input(s): "LIPASE", "AMYLASE" in the last 168 hours. No results for input(s): "AMMONIA" in the last 168 hours.  CBC: Recent Labs  Lab 11/29/22 0238 12/02/22 1626 12/03/22 0131  WBC 12.5* 12.8* 8.5  HGB 11.1* 11.9* 10.7*  HCT 33.0* 36.9 34.3*  MCV 93.0 95.8 98.8  PLT 195 192 161    Cardiac Enzymes: No results for input(s): "CKTOTAL", "CKMB",  "CKMBINDEX", "TROPONINI" in the last 168 hours.  BNP (last 3 results) Recent Labs    08/21/22 0323 11/19/22 1617 11/20/22 0143  BNP 461.9* 512.4* 524.6*    ProBNP (last 3 results) No results for input(s): "PROBNP" in the last 8760 hours.  Radiological Exams: CT CHEST WO CONTRAST  Result Date: 12/03/2022 CLINICAL DATA:  3 cm nodular density projecting over the left lung base on plain film. History of ampullary carcinoma. * Tracking Code: BO * EXAM: CT CHEST WITHOUT CONTRAST TECHNIQUE: Multidetector CT imaging of the chest was performed following the standard protocol without IV contrast. RADIATION DOSE REDUCTION: This exam was performed according to the departmental dose-optimization program which includes automated exposure control, adjustment of the mA and/or kV according to patient size and/or use of iterative reconstruction technique. COMPARISON:  Plain film of earlier today. CT of 11/14/2022, from [facility] FINDINGS: Cardiovascular: Mild cardiomegaly with small pericardial effusion. Aortic atherosclerosis. Left main and 3 vessel coronary artery calcification. Pulmonary artery enlargement, outflow tract 3.2 cm. Mediastinum/Nodes: No mediastinal adenopathy. Hilar regions poorly evaluated without intravenous contrast. Lungs/Pleura: Moderate right and small left pleural effusions, similar. Similarly compressed airways to the lung bases. Persistent, right greater than left septal thickening, primarily smooth. Bilateral pulmonary nodules consistent with metastatic disease. Index right upper lobe 11 mm nodule on 39/5 measures 10 mm on the prior. Posterior right upper lobe 15 mm nodule on 35/5 measured 14 mm on the prior. More inferior right upper lobe nodule measures 2.1 x 1.8 cm on 49/5 versus similar on  the prior CT. Worsened right and similar left base airspace disease. Upper Abdomen: Dependent gallstones. Normal imaged portions of the spleen, stomach, adrenal glands, kidneys, liver.  Pneumobilia is likely iatrogenic. Atrophic pancreas. Musculoskeletal: Progressive anasarca. IMPRESSION: 1. Pulmonary metastasis, similar to mildly progressive compared to 11/14/2022 chest CT. 2. Bilateral pleural effusions with bibasilar collapse/consolidation, most likely atelectasis. 3. Diffuse septal thickening, primarily felt to be related to pulmonary edema. Lymphangitic tumor spread could look similar. 4. Cardiomegaly with small pericardial effusion and progressive anasarca 5. Cholelithiasis 6. Coronary artery atherosclerosis. Aortic Atherosclerosis (ICD10-I70.0). 7. Pulmonary artery enlargement suggests pulmonary arterial hypertension. Electronically Signed   By: Abigail Miyamoto M.D.   On: 12/03/2022 13:59    Assessment/Plan Active Problems:   Acute on chronic respiratory failure with hypoxia (HCC)   Bronchopneumonia   Acute on chronic combined systolic and diastolic CHF (congestive heart failure) (HCC)   Chronic pain syndrome   Chronic atrial fibrillation with RVR (HCC)   Acute on chronic respiratory failure hypoxia will continue with BiPAP and titrate the oxygen down.  Reviewed the CT scan personally she has diffuse possibility of lymphangitic spread and also some solid nodules noted Bronchopneumonia has been treated with antibiotics we will continue to monitor closely some residual pneumonitis cannot be ruled out on the current CT Combined systolic diastolic heart failure overall no change will continue to follow along closely Chronic pain syndrome pain management Chronic atrial fibrillation rate is controlled at this time   I have personally seen and evaluated the patient, evaluated laboratory and imaging results, formulated the assessment and plan and placed orders. The Patient requires high complexity decision making with multiple systems involvement.  Rounds were done with the Respiratory Therapy Director and Staff therapists and discussed with nursing staff also.  Allyne Gee, MD  [facility] Pulmonary Critical Care Medicine Sleep Medicine

## 2022-12-06 ENCOUNTER — Other Ambulatory Visit (HOSPITAL_COMMUNITY): Payer: Medicare Other

## 2022-12-06 DIAGNOSIS — I482 Chronic atrial fibrillation, unspecified: Secondary | ICD-10-CM | POA: Diagnosis not present

## 2022-12-06 DIAGNOSIS — J180 Bronchopneumonia, unspecified organism: Secondary | ICD-10-CM | POA: Diagnosis not present

## 2022-12-06 DIAGNOSIS — J9621 Acute and chronic respiratory failure with hypoxia: Secondary | ICD-10-CM | POA: Diagnosis not present

## 2022-12-06 DIAGNOSIS — I5043 Acute on chronic combined systolic (congestive) and diastolic (congestive) heart failure: Secondary | ICD-10-CM | POA: Diagnosis not present

## 2022-12-06 LAB — BASIC METABOLIC PANEL
Anion gap: 8 (ref 5–15)
BUN: 30 mg/dL — ABNORMAL HIGH (ref 8–23)
CO2: 33 mmol/L — ABNORMAL HIGH (ref 22–32)
Calcium: 8.1 mg/dL — ABNORMAL LOW (ref 8.9–10.3)
Chloride: 98 mmol/L (ref 98–111)
Creatinine, Ser: 1.28 mg/dL — ABNORMAL HIGH (ref 0.44–1.00)
GFR, Estimated: 43 mL/min — ABNORMAL LOW (ref >60)
Glucose, Bld: 105 mg/dL — ABNORMAL HIGH (ref 70–99)
Potassium: 3.9 mmol/L (ref 3.5–5.1)
Sodium: 139 mmol/L (ref 135–145)

## 2022-12-06 LAB — CBC
HCT: 32.6 % — ABNORMAL LOW (ref 36.0–46.0)
Hemoglobin: 10.4 g/dL — ABNORMAL LOW (ref 12.0–15.0)
MCH: 31 pg (ref 26.0–34.0)
MCHC: 31.9 g/dL (ref 30.0–36.0)
MCV: 97 fL (ref 80.0–100.0)
Platelets: 154 10*3/uL (ref 150–400)
RBC: 3.36 MIL/uL — ABNORMAL LOW (ref 3.87–5.11)
RDW: 14.1 % (ref 11.5–15.5)
WBC: 8.5 10*3/uL (ref 4.0–10.5)
nRBC: 0 % (ref 0.0–0.2)

## 2022-12-06 LAB — MAGNESIUM: Magnesium: 2 mg/dL (ref 1.7–2.4)

## 2022-12-06 NOTE — Progress Notes (Signed)
Pulmonary [place]  PROGRESS NOTE     Debra Ryan  [id number]:[id number]  DOB: 06-Dec-1940   DOA: 11/13/2022  Referring Physician: Satira Sark, MD  HPI: Debra Ryan is a 82 y.o. female being followed for ventilator/airway/oxygen weaning Acute on Chronic Respiratory Failure.  Patient remains grossly unchanged currently is on 3 L of oxygen apparently refused BiPAP last night  Medications: Reviewed on Rounds  Physical Exam:  Vitals: Temperature is 96.9 pulse of 90 respiratory rate is 30 blood pressure 112/68 saturation is 100%  Ventilator Settings 3 L of oxygen  General: Comfortable at this time Neck: supple Cardiovascular: no malignant arrhythmias Respiratory: Scattered rhonchi expansion is equal Skin: no rash seen on limited exam Musculoskeletal: No gross abnormality Psychiatric:unable to assess Neurologic:no involuntary movements         Lab Data:   Basic Metabolic Panel: Recent Labs  Lab 12/02/22 1626 12/03/22 0131 12/06/22 0145  NA 136 136 139  K 3.6 4.1 3.9  CL 99 103 98  CO2 29 28 33*  GLUCOSE 99 87 105*  BUN 36* 37* 30*  CREATININE 1.30* 1.27* 1.28*  CALCIUM 8.0* 7.7* 8.1*  MG  --  2.2 2.0    ABG: No results for input(s): "PHART", "PCO2ART", "PO2ART", "HCO3", "O2SAT" in the last 168 hours.  Liver Function Tests: No results for input(s): "AST", "ALT", "ALKPHOS", "BILITOT", "PROT", "ALBUMIN" in the last 168 hours. No results for input(s): "LIPASE", "AMYLASE" in the last 168 hours. No results for input(s): "AMMONIA" in the last 168 hours.  CBC: Recent Labs  Lab 12/02/22 1626 12/03/22 0131 12/06/22 0145  WBC 12.8* 8.5 8.5  HGB 11.9* 10.7* 10.4*  HCT 36.9 34.3* 32.6*  MCV 95.8 98.8 97.0  PLT 192 161 154    Cardiac Enzymes: No results for input(s): "CKTOTAL", "CKMB", "CKMBINDEX", "TROPONINI" in the last 168 hours.  BNP (last 3 results) Recent Labs     08/21/22 0323 11/19/22 1617 11/20/22 0143  BNP 461.9* 512.4* 524.6*    ProBNP (last 3 results) No results for input(s): "PROBNP" in the last 8760 hours.  Radiological Exams: No results found.  Assessment/Plan Active Problems:   Acute on chronic respiratory failure with hypoxia (HCC)   Bronchopneumonia   Acute on chronic combined systolic and diastolic CHF (congestive heart failure) (HCC)   Chronic pain syndrome   Chronic atrial fibrillation with RVR (HCC)   Acute on chronic respiratory failure with hypoxia plan continue with oxygen therapy titrate as tolerated continue pulmonary toilet. Bronchopneumonia treated we will continue to follow along closely Chronic pain syndrome supportive care Chronic atrial fibrillation rate is controlled Chronic diastolic heart failure compensated we will continue to monitor   I have personally seen and evaluated the patient, evaluated laboratory and imaging results, formulated the assessment and plan and placed orders. The Patient requires high complexity decision making with multiple systems involvement.  Rounds were done with the Respiratory Therapy Director and Staff therapists and discussed with nursing staff also.  Allyne Gee, MD [facility] Pulmonary Critical Care Medicine Sleep Medicine

## 2022-12-07 ENCOUNTER — Other Ambulatory Visit (HOSPITAL_COMMUNITY): Payer: Medicare Other

## 2022-12-07 DIAGNOSIS — J180 Bronchopneumonia, unspecified organism: Secondary | ICD-10-CM | POA: Diagnosis not present

## 2022-12-07 DIAGNOSIS — I482 Chronic atrial fibrillation, unspecified: Secondary | ICD-10-CM | POA: Diagnosis not present

## 2022-12-07 DIAGNOSIS — J9621 Acute and chronic respiratory failure with hypoxia: Secondary | ICD-10-CM | POA: Diagnosis not present

## 2022-12-07 DIAGNOSIS — I5043 Acute on chronic combined systolic (congestive) and diastolic (congestive) heart failure: Secondary | ICD-10-CM | POA: Diagnosis not present

## 2022-12-07 HISTORY — PX: IR THORACENTESIS ASP PLEURAL SPACE W/IMG GUIDE: IMG5380

## 2022-12-07 LAB — GLUCOSE, PLEURAL OR PERITONEAL FLUID: Glucose, Fluid: 163 mg/dL

## 2022-12-07 LAB — PROTEIN, PLEURAL OR PERITONEAL FLUID: Total protein, fluid: <3 g/dL

## 2022-12-07 LAB — GRAM STAIN

## 2022-12-07 NOTE — Procedures (Signed)
PROCEDURE SUMMARY:  Successful US guided left thoracentesis. Yielded 800 ml of amber-colored fluid. Pt tolerated procedure well. No immediate complications.  Specimen sent for labs. CXR ordered; no post-procedure pneumothorax identified   EBL < 2 mL  Theresa Duty, NP 12/07/2022 11:20 AM

## 2022-12-07 NOTE — Progress Notes (Signed)
Pulmonary [name]  PROGRESS NOTE     Debra Ryan  ESP:[id number]  DOB: [birth year]   DOA: 11/05/2022  Referring Physician: Satira Sark, MD  HPI: Debra Ryan is a 82 y.o. female being followed for ventilator/airway/oxygen weaning Acute on Chronic Respiratory Failure.  Patient is on 3 L of oxygen saturations are good using the CPAP at night  Medications: Reviewed on Rounds  Physical Exam:  Vitals: Temperature is 97.8 pulse of 84 respiratory rate 16  Ventilator Settings on 3 L of O2  General: Comfortable at this time Neck: supple Cardiovascular: no malignant arrhythmias Respiratory: No rhonchi no rales are noted Skin: no rash seen on limited exam Musculoskeletal: No gross abnormality Psychiatric:unable to assess Neurologic:no involuntary movements         Lab Data:   Basic Metabolic Panel: Recent Labs  Lab 12/02/22 1626 12/03/22 0131 12/06/22 0145  NA 136 136 139  K 3.6 4.1 3.9  CL 99 103 98  CO2 29 28 33*  GLUCOSE 99 87 105*  BUN 36* 37* 30*  CREATININE 1.30* 1.27* 1.28*  CALCIUM 8.0* 7.7* 8.1*  MG  --  2.2 2.0    ABG: No results for input(s): "PHART", "PCO2ART", "PO2ART", "HCO3", "O2SAT" in the last 168 hours.  Liver Function Tests: No results for input(s): "AST", "ALT", "ALKPHOS", "BILITOT", "PROT", "ALBUMIN" in the last 168 hours. No results for input(s): "LIPASE", "AMYLASE" in the last 168 hours. No results for input(s): "AMMONIA" in the last 168 hours.  CBC: Recent Labs  Lab 12/02/22 1626 12/03/22 0131 12/06/22 0145  WBC 12.8* 8.5 8.5  HGB 11.9* 10.7* 10.4*  HCT 36.9 34.3* 32.6*  MCV 95.8 98.8 97.0  PLT 192 161 154    Cardiac Enzymes: No results for input(s): "CKTOTAL", "CKMB", "CKMBINDEX", "TROPONINI" in the last 168 hours.  BNP (last 3 results) Recent Labs    08/21/22 0323 11/19/22 1617 11/20/22 0143  BNP 461.9* 512.4* 524.6*     ProBNP (last 3 results) No results for input(s): "PROBNP" in the last 8760 hours.  Radiological Exams: No results found.  Assessment/Plan Active Problems:   Acute on chronic respiratory failure with hypoxia (HCC)   Bronchopneumonia   Acute on chronic combined systolic and diastolic CHF (congestive heart failure) (HCC)   Chronic pain syndrome   Chronic atrial fibrillation with RVR (HCC)   Acute on chronic respiratory failure hypoxia will continue with oxygen therapy encourage compliance with BiPAP Lobar pneumonia no change supportive care will continue to monitor Chronic combined systolic diastolic heart failure supportive care Chronic pain syndrome pain management will continue to monitor Chronic atrial fibrillation rate is controlled   I have personally seen and evaluated the patient, evaluated laboratory and imaging results, formulated the assessment and plan and placed orders. The Patient requires high complexity decision making with multiple systems involvement.  Rounds were done with the Respiratory Therapy Director and Staff therapists and discussed with nursing staff also.  Allyne Gee, MD [facility] Pulmonary Critical Care Medicine Sleep Medicine

## 2022-12-08 ENCOUNTER — Other Ambulatory Visit (HOSPITAL_COMMUNITY): Payer: Medicare Other

## 2022-12-08 DIAGNOSIS — J180 Bronchopneumonia, unspecified organism: Secondary | ICD-10-CM | POA: Diagnosis not present

## 2022-12-08 DIAGNOSIS — J9621 Acute and chronic respiratory failure with hypoxia: Secondary | ICD-10-CM | POA: Diagnosis not present

## 2022-12-08 DIAGNOSIS — I5043 Acute on chronic combined systolic (congestive) and diastolic (congestive) heart failure: Secondary | ICD-10-CM | POA: Diagnosis not present

## 2022-12-08 DIAGNOSIS — I482 Chronic atrial fibrillation, unspecified: Secondary | ICD-10-CM | POA: Diagnosis not present

## 2022-12-08 LAB — ECHOCARDIOGRAM LIMITED: S' Lateral: 3.5 cm

## 2022-12-08 NOTE — Consult Note (Signed)
Ref: No primary care provider on file.   Subjective:  Awake.  Improved breathing post 800 cc fluid removed by right thoracentesis  Objective:  Vital Signs in the last 24 hours:  P: 93, a. Fib. R: 27, O2 sat 97 % by 2 L O2 by [place].  Physical Exam: BP Readings from Last 1 Encounters:  11/30/22 104/71     Wt Readings from Last 1 Encounters:  08/25/22 104.6 kg    Weight change:  There is no height or weight on file to calculate BMI. HEENT: [place]/AT, Eyes-Blue, Conjunctiva-Pale pink, Sclera-Non-icteric Neck: No JVD, No bruit, Trachea midline. Lungs:  Clearing, Bilateral. Cardiac: Irregular rhythm, normal S1 and S2, no S3. II/VI systolic murmur. Abdomen:  Soft, non-tender. BS present. Extremities:  No edema present. No cyanosis. No clubbing. CNS: AxOx2, Cranial nerves grossly intact, moves all 4 extremities.  Skin: Warm and dry.   Intake/Output from previous day: No intake/output data recorded.    Lab Results: BMET    Component Value Date/Time   NA 139 12/06/2022 0145   NA 136 12/03/2022 0131   NA 136 12/02/2022 1626   K 3.9 12/06/2022 0145   K 4.1 12/03/2022 0131   K 3.6 12/02/2022 1626   CL 98 12/06/2022 0145   CL 103 12/03/2022 0131   CL 99 12/02/2022 1626   CO2 33 (H) 12/06/2022 0145   CO2 28 12/03/2022 0131   CO2 29 12/02/2022 1626   GLUCOSE 105 (H) 12/06/2022 0145   GLUCOSE 87 12/03/2022 0131   GLUCOSE 99 12/02/2022 1626   BUN 30 (H) 12/06/2022 0145   BUN 37 (H) 12/03/2022 0131   BUN 36 (H) 12/02/2022 1626   CREATININE 1.28 (H) 12/06/2022 0145   CREATININE 1.27 (H) 12/03/2022 0131   CREATININE 1.30 (H) 12/02/2022 1626   CALCIUM 8.1 (L) 12/06/2022 0145   CALCIUM 7.7 (L) 12/03/2022 0131   CALCIUM 8.0 (L) 12/02/2022 1626   GFRNONAA 43 (L) 12/06/2022 0145   GFRNONAA 43 (L) 12/03/2022 0131   GFRNONAA 42 (L) 12/02/2022 1626   GFRAA 54 (L) 08/09/2019 2004   GFRAA 47 (L) 11/02/2017 1120   GFRAA 57 (L) 02/11/2014 1524   CBC    Component Value Date/Time   WBC  8.5 12/06/2022 0145   RBC 3.36 (L) 12/06/2022 0145   HGB 10.4 (L) 12/06/2022 0145   HCT 32.6 (L) 12/06/2022 0145   PLT 154 12/06/2022 0145   MCV 97.0 12/06/2022 0145   MCH 31.0 12/06/2022 0145   MCHC 31.9 12/06/2022 0145   RDW 14.1 12/06/2022 0145   LYMPHSABS 0.3 (L) 11/27/2022 0119   MONOABS 0.2 11/27/2022 0119   EOSABS 0.0 11/27/2022 0119   BASOSABS 0.0 11/27/2022 0119   HEPATIC Function Panel Recent Labs    11/19/22 1047 11/24/22 0237 11/27/22 0119  PROT 5.5* 5.2* 4.9*  ALBUMIN 2.7* 2.6* 2.6*  AST '19 18 22  '$ ALT '12 15 14  '$ ALKPHOS 78 95 75   HEMOGLOBIN A1C Lab Results  Component Value Date   MPG 205.86 08/15/2022   CARDIAC ENZYMES Lab Results  Component Value Date   TROPONINI 0.73 ([place]) 02/11/2014   TROPONINI <0.30 01/20/2014   TROPONINI >20.00 ([place]) 10/05/2013   BNP No results for input(s): "PROBNP" in the last 8760 hours. TSH Recent Labs    08/21/22 0323 11/19/22 1617 11/20/22 0143  TSH 0.956 5.161* 3.975   CHOLESTEROL No results for input(s): "CHOL" in the last 8760 hours.  Scheduled Meds: Continuous Infusions: PRN Meds:.  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   LOS: 0 days   Time spent including chart review, lab review, examination, discussion with patient/Doctor : 30 min   Dixie Dials  MD  12/08/2022, 11:50 AM

## 2022-12-08 NOTE — Progress Notes (Signed)
Echocardiogram 2D Echocardiogram has been performed.  Debra Ryan 12/08/2022, 2:12 PM

## 2022-12-08 NOTE — Progress Notes (Addendum)
Pulmonary [place]  PROGRESS NOTE     Debra Ryan  [id number]  DOB: 1940-12-19   DOA: 11/05/2022  Referring Physician: Satira Sark, MD  HPI: Debra Ryan is a 82 y.o. female being followed for ventilator/airway/oxygen weaning Acute on Chronic Respiratory Failure.  Patient seen lying in bed, currently on 3 L nasal cannula which is baseline.  Will continue use of BiPAP during resting/sleeping and nocturnal hours.  Medications: Reviewed on Rounds  Physical Exam:  Vitals: Temp 96.5, pulse 89, respirations 11, BP 107/69, SpO2 99%  Ventilator Settings 3 L nasal cannula  General: Comfortable at this time Neck: supple Cardiovascular: no malignant arrhythmias Respiratory: Bilaterally diminished Skin: no rash seen on limited exam Musculoskeletal: No gross abnormality Psychiatric:unable to assess Neurologic:no involuntary movements         Lab Data:   Basic Metabolic Panel: Recent Labs  Lab 12/02/22 1626 12/03/22 0131 12/06/22 0145  NA 136 136 139  K 3.6 4.1 3.9  CL 99 103 98  CO2 29 28 33*  GLUCOSE 99 87 105*  BUN 36* 37* 30*  CREATININE 1.30* 1.27* 1.28*  CALCIUM 8.0* 7.7* 8.1*  MG  --  2.2 2.0    ABG: No results for input(s): "PHART", "PCO2ART", "PO2ART", "HCO3", "O2SAT" in the last 168 hours.  Liver Function Tests: No results for input(s): "AST", "ALT", "ALKPHOS", "BILITOT", "PROT", "ALBUMIN" in the last 168 hours. No results for input(s): "LIPASE", "AMYLASE" in the last 168 hours. No results for input(s): "AMMONIA" in the last 168 hours.  CBC: Recent Labs  Lab 12/02/22 1626 12/03/22 0131 12/06/22 0145  WBC 12.8* 8.5 8.5  HGB 11.9* 10.7* 10.4*  HCT 36.9 34.3* 32.6*  MCV 95.8 98.8 97.0  PLT 192 161 154    Cardiac Enzymes: No results for input(s): "CKTOTAL", "CKMB", "CKMBINDEX", "TROPONINI" in the last 168 hours.  BNP (last 3 results) Recent Labs     08/21/22 0323 11/19/22 1617 11/20/22 0143  BNP 461.9* 512.4* 524.6*    ProBNP (last 3 results) No results for input(s): "PROBNP" in the last 8760 hours.  Radiological Exams: IR THORACENTESIS ASP PLEURAL SPACE W/IMG GUIDE  Result Date: 12/07/2022 INDICATION: Patient with a history of heart failure and recurrent bilateral pleural effusions. Interventional radiology asked to perform a diagnostic therapeutic thoracentesis. EXAM: ULTRASOUND GUIDED THORACENTESIS MEDICATIONS: 1% lidocaine 10 mL COMPLICATIONS: None immediate. PROCEDURE: An ultrasound guided thoracentesis was thoroughly discussed with the patient and questions answered. The benefits, risks, alternatives and complications were also discussed. The patient understands and wishes to proceed with the procedure. Written consent was obtained. Ultrasound was performed to localize and mark an adequate pocket of fluid in the left chest. The area was then prepped and draped in the normal sterile fashion. 1% Lidocaine was used for local anesthesia. Under ultrasound guidance a 6 Fr Safe-T-Centesis catheter was introduced. Thoracentesis was performed. The catheter was removed and a dressing applied. FINDINGS: A total of approximately 800 mL of amber-colored fluid was removed. Samples were sent to the laboratory as requested by the clinical team. IMPRESSION: Successful ultrasound guided right thoracentesis yielding 800 mL of pleural fluid. Read by: Soyla Dryer, NP Electronically Signed   By: Sandi Mariscal M.D.   On: 12/07/2022 11:55   CT ABDOMEN PELVIS WO CONTRAST  Result Date: 12/07/2022 CLINICAL DATA:  Ampullary cancer with pulmonary metastatic disease. * Tracking Code: BO * EXAM: CT ABDOMEN AND PELVIS WITHOUT CONTRAST TECHNIQUE: Multidetector CT imaging  of the abdomen and pelvis was performed following the standard protocol without IV contrast. RADIATION DOSE REDUCTION: This exam was performed according to the departmental dose-optimization program  which includes automated exposure control, adjustment of the mA and/or kV according to patient size and/or use of iterative reconstruction technique. COMPARISON:  CT chest 12/03/2022 and CT abdomen pelvis 07/22/2022. FINDINGS: Lower chest: Large right pleural effusion with collapse/consolidation in the right lower lobe, similar to increased from 12/03/2022. Collapse/consolidation in the central left lower lobe with overall improved aeration when compared with 12/03/2022. Basilar pulmonary nodules measure up to 8 mm in the left lower lobe (5/12). Trace left pleural fluid, decreased from 12/03/2022. Heart is enlarged. Large pericardial effusion. Atherosclerotic calcification of the aorta, aortic valve and coronary arteries. Distal esophagus is grossly unremarkable. Hepatobiliary: Liver is unremarkable. Stones layer in the gallbladder. No biliary ductal dilatation. Pancreas: Assessment of an ampullary lesion is challenging without IV contrast. Grossly unremarkable. Spleen: Negative. Adrenals/Urinary Tract: Adrenal glands and kidneys are grossly unremarkable. Ureters are decompressed. Bladder is grossly unremarkable. Stomach/Bowel: Stomach, small bowel and colon are grossly unremarkable. There may be slight rectal wall thickening. Appendix is not readily visualized. Vascular/Lymphatic: Atherosclerotic calcification of the aorta. No pathologically enlarged lymph nodes. Reproductive: No adnexal mass. Other: Bilateral inguinal hernias contain fat. No free fluid. Mesenteries and peritoneum are unremarkable. Musculoskeletal: Degenerative changes in the spine. No worrisome lytic or sclerotic lesions. IMPRESSION: 1. 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 Pulmonary metastatic disease. 2. Large right pleural effusion with right lower lobe collapse/consolidation, similar to increased from 12/03/2022. 3. Improved collapse/consolidation in the left lower lobe with trace residual left pleural fluid.  4. Large pericardial effusion. 5. Cholelithiasis. 6. Difficult to exclude slight rectal wall thickening. 7. Aortic atherosclerosis (ICD10-I70.0). Coronary artery calcification. Electronically Signed   By: Lorin Picket M.D.   On: 12/07/2022 11:26   DG Chest 1 View  Result Date: 12/07/2022 CLINICAL DATA:  Status post thoracentesis. EXAM: CHEST  1 VIEW COMPARISON:  Chest radiograph and CT 12/03/2022 FINDINGS: The cardiac silhouette remains enlarged. Aortic atherosclerosis is noted. Bibasilar lung opacities likely reflect a combination of collapse/consolidation and small to moderate pleural effusions. Nodular lung densities correspond to metastases, more fully evaluated on recent chest CT. The interstitial markings remain diffusely increased which may reflect a component of edema. No definite pneumothorax is identified. IMPRESSION: 1. No pneumothorax identified following thoracentesis. 2. Persistent bibasilar collapse/consolidation with pleural effusions and suspected interstitial edema. Electronically Signed   By: Logan Bores M.D.   On: 12/07/2022 11:00    Assessment/Plan Active Problems:   Acute on chronic respiratory failure with hypoxia (HCC)   Bronchopneumonia   Acute on chronic combined systolic and diastolic CHF (congestive heart failure) (HCC)   Chronic pain syndrome   Chronic atrial fibrillation with RVR (HCC)   Acute on chronic respiratory failure hypoxia-remains on baseline 3 L nasal cannula.  Will continue with oxygen therapy encourage compliance with BiPAP Lobar pneumonia no change supportive care will continue to monitor Chronic combined systolic diastolic heart failure supportive care Chronic pain syndrome pain management will continue to monitor Chronic atrial fibrillation rate is controlled   I have personally seen and evaluated the patient, evaluated laboratory and imaging results, formulated the assessment and plan and placed orders. The Patient requires high complexity  decision making with multiple systems involvement.  Rounds were done with the Respiratory Therapy Director and Staff therapists and discussed with nursing staff also.  Allyne Gee, MD [facility] Pulmonary  Critical Care Medicine Sleep Medicine

## 2022-12-09 DIAGNOSIS — I482 Chronic atrial fibrillation, unspecified: Secondary | ICD-10-CM | POA: Diagnosis not present

## 2022-12-09 DIAGNOSIS — I5043 Acute on chronic combined systolic (congestive) and diastolic (congestive) heart failure: Secondary | ICD-10-CM | POA: Diagnosis not present

## 2022-12-09 DIAGNOSIS — J9621 Acute and chronic respiratory failure with hypoxia: Secondary | ICD-10-CM | POA: Diagnosis not present

## 2022-12-09 DIAGNOSIS — J180 Bronchopneumonia, unspecified organism: Secondary | ICD-10-CM | POA: Diagnosis not present

## 2022-12-09 LAB — CBC
HCT: 30.2 % — ABNORMAL LOW (ref 36.0–46.0)
Hemoglobin: 9.3 g/dL — ABNORMAL LOW (ref 12.0–15.0)
MCH: 30.6 pg (ref 26.0–34.0)
MCHC: 30.8 g/dL (ref 30.0–36.0)
MCV: 99.3 fL (ref 80.0–100.0)
Platelets: 162 10*3/uL (ref 150–400)
RBC: 3.04 MIL/uL — ABNORMAL LOW (ref 3.87–5.11)
RDW: 14.2 % (ref 11.5–15.5)
WBC: 7 10*3/uL (ref 4.0–10.5)
nRBC: 0 % (ref 0.0–0.2)

## 2022-12-09 LAB — MAGNESIUM: Magnesium: 1.9 mg/dL (ref 1.7–2.4)

## 2022-12-09 LAB — BASIC METABOLIC PANEL
Anion gap: 9 (ref 5–15)
BUN: 25 mg/dL — ABNORMAL HIGH (ref 8–23)
CO2: 33 mmol/L — ABNORMAL HIGH (ref 22–32)
Calcium: 8 mg/dL — ABNORMAL LOW (ref 8.9–10.3)
Chloride: 93 mmol/L — ABNORMAL LOW (ref 98–111)
Creatinine, Ser: 1.48 mg/dL — ABNORMAL HIGH (ref 0.44–1.00)
GFR, Estimated: 36 mL/min — ABNORMAL LOW (ref >60)
Glucose, Bld: 154 mg/dL — ABNORMAL HIGH (ref 70–99)
Potassium: 4.3 mmol/L (ref 3.5–5.1)
Sodium: 135 mmol/L (ref 135–145)

## 2022-12-09 LAB — CYTOLOGY - NON PAP

## 2022-12-09 NOTE — Progress Notes (Cosign Needed)
Pulmonary [place]  PROGRESS NOTE     Debra Ryan  [id number]  DOB: 1941/05/04   DOA: 10/31/2022  Referring Physician: Satira Sark, MD  HPI: Debra Ryan is a 82 y.o. female being followed for ventilator/airway/oxygen weaning Acute on Chronic Respiratory Failure.  Patient seen sitting up in bed, currently on BiPAP.  Patient has been requesting BiPAP/high flow/nasal cannula at random times.  She does not seem to understand what these devices are for.  According to respiratory therapy they have been completing patient's request to make her comfortable.  Medications: Reviewed on Rounds  Physical Exam:  Vitals: Temp 97.1, pulse 86, respirations 15, BP 112/73, SpO2 98%  Ventilator Settings BiPAP 12/5 FiO2 40%  General: Comfortable at this time Neck: supple Cardiovascular: no malignant arrhythmias Respiratory: Bilaterally diminished Skin: no rash seen on limited exam Musculoskeletal: No gross abnormality Psychiatric:unable to assess Neurologic:no involuntary movements         Lab Data:   Basic Metabolic Panel: Recent Labs  Lab 12/02/22 1626 12/03/22 0131 12/06/22 0145 12/09/22 0205  NA 136 136 139 135  K 3.6 4.1 3.9 4.3  CL 99 103 98 93*  CO2 29 28 33* 33*  GLUCOSE 99 87 105* 154*  BUN 36* 37* 30* 25*  CREATININE 1.30* 1.27* 1.28* 1.48*  CALCIUM 8.0* 7.7* 8.1* 8.0*  MG  --  2.2 2.0 1.9    ABG: No results for input(s): "PHART", "PCO2ART", "PO2ART", "HCO3", "O2SAT" in the last 168 hours.  Liver Function Tests: No results for input(s): "AST", "ALT", "ALKPHOS", "BILITOT", "PROT", "ALBUMIN" in the last 168 hours. No results for input(s): "LIPASE", "AMYLASE" in the last 168 hours. No results for input(s): "AMMONIA" in the last 168 hours.  CBC: Recent Labs  Lab 12/02/22 1626 12/03/22 0131 12/06/22 0145 12/09/22 0205  WBC 12.8* 8.5 8.5 7.0  HGB 11.9* 10.7*  10.4* 9.3*  HCT 36.9 34.3* 32.6* 30.2*  MCV 95.8 98.8 97.0 99.3  PLT 192 161 154 162    Cardiac Enzymes: No results for input(s): "CKTOTAL", "CKMB", "CKMBINDEX", "TROPONINI" in the last 168 hours.  BNP (last 3 results) Recent Labs    08/21/22 0323 11/19/22 1617 11/20/22 0143  BNP 461.9* 512.4* 524.6*    ProBNP (last 3 results) No results for input(s): "PROBNP" in the last 8760 hours.  Radiological Exams: ECHOCARDIOGRAM LIMITED  Result Date: 12/08/2022    ECHOCARDIOGRAM LIMITED REPORT   Patient Name:   Debra Ryan Date of Exam: 12/08/2022 Medical Rec #:  [id number]           Height:       62.0 in Accession #:    [id number]          Weight:       230.6 lb Date of Birth:  01/08/1941           BSA:          2.031 m Patient Age:    82 years            BP:           104/71 mmHg Patient Gender: F                   HR:           79 bpm. Exam Location:  Inpatient Procedure: Limited Echo and Limited Color Doppler Indications:    Pericardial Effusion I31.3  History:  Patient has prior history of Echocardiogram examinations, most                 recent 11/24/2022. CHF, CAD, Arrythmias:Atrial Fibrillation; Risk                 Factors:Hypertension, Dyslipidemia, Sleep Apnea and Diabetes.                 CKD, stage III.  Sonographer:    Ronny Flurry Referring Phys: [place]  1. Left ventricular ejection fraction, by estimation, is 45 to 50%. The left ventricle has mildly decreased function. The left ventricle demonstrates regional wall motion abnormalities (see scoring diagram/findings for description). Indeterminate diastolic filling due to E-A fusion. There is mild hypokinesis of the left ventricular, entire septal wall.  2. Right ventricular systolic function is low normal. The right ventricular size is normal.  3. Left atrial size was moderately dilated.  4. Right atrial size was mildly dilated.  5. Moderate pericardial effusion. The pericardial effusion is  circumferential and posterior to the left ventricle. There is no evidence of cardiac tamponade.  6. The mitral valve is degenerative. Moderate to severe mitral valve regurgitation. Moderate mitral annular calcification.  7. The aortic valve is tricuspid. There is mild calcification of the aortic valve. Aortic valve regurgitation is not visualized.  8. There is mild (Grade II) atheroma plaque involving the ascending aorta.  9. The inferior vena cava is dilated in size with <50% respiratory variability, suggesting right atrial pressure of 15 mmHg. FINDINGS  Left Ventricle: Left ventricular ejection fraction, by estimation, is 45 to 50%. The left ventricle has mildly decreased function. The left ventricle demonstrates regional wall motion abnormalities. Mild hypokinesis of the left ventricular, entire septal wall. The left ventricular internal cavity size was normal in size. There is borderline concentric left ventricular hypertrophy. Indeterminate diastolic filling due to E-A fusion. Right Ventricle: The right ventricular size is normal. No increase in right ventricular wall thickness. Right ventricular systolic function is low normal. Left Atrium: Left atrial size was moderately dilated. Right Atrium: Right atrial size was mildly dilated. Pericardium: A moderately sized pericardial effusion is present. The pericardial effusion is circumferential and posterior to the left ventricle. There is no evidence of cardiac tamponade. Mitral Valve: The mitral valve is degenerative in appearance. Moderate mitral annular calcification. Moderate to severe mitral valve regurgitation. Tricuspid Valve: The tricuspid valve is normal in structure. Tricuspid valve regurgitation is trivial. Aortic Valve: The aortic valve is tricuspid. There is mild calcification of the aortic valve. There is mild aortic valve annular calcification. Aortic valve regurgitation is not visualized. Pulmonic Valve: The pulmonic valve was normal in structure.  Aorta: The aortic root is normal in size and structure. There is mild (Grade II) atheroma plaque involving the ascending aorta. Venous: The inferior vena cava is dilated in size with less than 50% respiratory variability, suggesting right atrial pressure of 15 mmHg. IAS/Shunts: The atrial septum is grossly normal. Additional Comments: There is a small pleural effusion in both left and right lateral regions.  LEFT VENTRICLE PLAX 2D LVIDd:         4.70 cm LVIDs:         3.50 cm LV PW:         1.10 cm LV IVS:        1.10 cm LVOT diam:     2.20 cm LVOT Area:     3.80 cm  IVC IVC diam: 2.40 cm LEFT ATRIUM  Index LA diam:    4.90 cm 2.41 cm/m   AORTA Ao Root diam: 3.50 cm Ao Asc diam:  3.50 cm  SHUNTS Systemic Diam: 2.20 cm Dixie Dials MD Electronically signed by Dixie Dials MD Signature Date/Time: 12/08/2022/5:42:38 PM    Final     Assessment/Plan Active Problems:   Acute on chronic respiratory failure with hypoxia (HCC)   Bronchopneumonia   Acute on chronic combined systolic and diastolic CHF (congestive heart failure) (HCC)   Chronic pain syndrome   Chronic atrial fibrillation with RVR (HCC)   Acute on chronic respiratory failure hypoxia-currently on BiPAP.  Continue BiPAP, heated high flow, nasal cannula per patient's request to comfort. Lobar pneumonia no change supportive care will continue to monitor Chronic combined systolic diastolic heart failure supportive care Chronic pain syndrome pain management will continue to monitor Chronic atrial fibrillation rate is controlled   I have personally seen and evaluated the patient, evaluated laboratory and imaging results, formulated the assessment and plan and placed orders. The Patient requires high complexity decision making with multiple systems involvement.  Rounds were done with the Respiratory Therapy Director and Staff therapists and discussed with nursing staff also.  Allyne Gee, MD [facility] Pulmonary Critical Care Medicine Sleep  Medicine

## 2022-12-09 NOTE — Consult Note (Signed)
Ref: Debra Ferries, MD   Subjective:  Awake. On High flow oxygen. Patient is aware of moderate pericardial effusion without tamponade. Creatinine 1.48 and Hgb 9.3 gm.  Objective:  Vital Signs in the last 24 hours:  P: 98, R: 40, O2 sat 96 %.   Physical Exam: BP Readings from Last 1 Encounters:  11/30/22 104/71     Wt Readings from Last 1 Encounters:  08/25/22 104.6 kg    Weight change:  There is no height or weight on file to calculate BMI. HEENT: [place]/AT, Eyes-Blue, Conjunctiva-Pale pink, Sclera-Non-icteric Neck: No JVD, No bruit, Trachea midline. Lungs:  Clearing, Bilateral. Cardiac:  Irregular rhythm, normal S1 and S2, no S3. II/VI systolic murmur. Abdomen:  Soft, non-tender. BS present. Extremities:  No edema present. No cyanosis. No clubbing. CNS: AxOx2, Cranial nerves grossly intact, moves all 4 extremities.  Skin: Warm and dry.   Intake/Output from previous day: No intake/output data recorded.    Lab Results: BMET    Component Value Date/Time   NA 135 12/09/2022 0205   NA 139 12/06/2022 0145   NA 136 12/03/2022 0131   K 4.3 12/09/2022 0205   K 3.9 12/06/2022 0145   K 4.1 12/03/2022 0131   CL 93 (L) 12/09/2022 0205   CL 98 12/06/2022 0145   CL 103 12/03/2022 0131   CO2 33 (H) 12/09/2022 0205   CO2 33 (H) 12/06/2022 0145   CO2 28 12/03/2022 0131   GLUCOSE 154 (H) 12/09/2022 0205   GLUCOSE 105 (H) 12/06/2022 0145   GLUCOSE 87 12/03/2022 0131   BUN 25 (H) 12/09/2022 0205   BUN 30 (H) 12/06/2022 0145   BUN 37 (H) 12/03/2022 0131   CREATININE 1.48 (H) 12/09/2022 0205   CREATININE 1.28 (H) 12/06/2022 0145   CREATININE 1.27 (H) 12/03/2022 0131   CALCIUM 8.0 (L) 12/09/2022 0205   CALCIUM 8.1 (L) 12/06/2022 0145   CALCIUM 7.7 (L) 12/03/2022 0131   GFRNONAA 36 (L) 12/09/2022 0205   GFRNONAA 43 (L) 12/06/2022 0145   GFRNONAA 43 (L) 12/03/2022 0131   GFRAA 54 (L) 08/09/2019 2004   GFRAA 47 (L) 11/02/2017 1120   GFRAA 57 (L) 02/11/2014 1524    CBC    Component Value Date/Time   WBC 7.0 12/09/2022 0205   RBC 3.04 (L) 12/09/2022 0205   HGB 9.3 (L) 12/09/2022 0205   HCT 30.2 (L) 12/09/2022 0205   PLT 162 12/09/2022 0205   MCV 99.3 12/09/2022 0205   MCH 30.6 12/09/2022 0205   MCHC 30.8 12/09/2022 0205   RDW 14.2 12/09/2022 0205   LYMPHSABS 0.3 (L) 11/27/2022 0119   MONOABS 0.2 11/27/2022 0119   EOSABS 0.0 11/27/2022 0119   BASOSABS 0.0 11/27/2022 0119   HEPATIC Function Panel Recent Labs    11/19/22 1047 11/24/22 0237 11/27/22 0119  PROT 5.5* 5.2* 4.9*  ALBUMIN 2.7* 2.6* 2.6*  AST 19 18 22  $ ALT 12 15 14  $ ALKPHOS 78 95 75   HEMOGLOBIN A1C Lab Results  Component Value Date   MPG 205.86 08/15/2022   CARDIAC ENZYMES Lab Results  Component Value Date   TROPONINI 0.73 ([place]) 02/11/2014   TROPONINI <0.30 01/20/2014   TROPONINI >20.00 ([place]) 10/05/2013   BNP No results for input(s): "PROBNP" in the last 8760 hours. TSH Recent Labs    08/21/22 0323 11/19/22 1617 11/20/22 0143  TSH 0.956 5.161* 3.975   CHOLESTEROL No results for input(s): "CHOL" in the last 8760 hours.  Scheduled Meds: Continuous Infusions: PRN Meds:.  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 Daily. Avoid indomethacin or high dose aspirin for anemia and renal dysfunction   LOS: 0 days   Time spent including chart review, lab review, examination, discussion with patient/Nurse/Tech : 30 min   Dixie Dials  MD  12/09/2022, 11:02 AM

## 2022-12-10 ENCOUNTER — Other Ambulatory Visit (HOSPITAL_COMMUNITY): Payer: Medicare Other

## 2022-12-10 DIAGNOSIS — J9621 Acute and chronic respiratory failure with hypoxia: Secondary | ICD-10-CM | POA: Diagnosis not present

## 2022-12-10 DIAGNOSIS — J180 Bronchopneumonia, unspecified organism: Secondary | ICD-10-CM | POA: Diagnosis not present

## 2022-12-10 DIAGNOSIS — I5043 Acute on chronic combined systolic (congestive) and diastolic (congestive) heart failure: Secondary | ICD-10-CM | POA: Diagnosis not present

## 2022-12-10 DIAGNOSIS — I482 Chronic atrial fibrillation, unspecified: Secondary | ICD-10-CM | POA: Diagnosis not present

## 2022-12-10 NOTE — Progress Notes (Cosign Needed)
Pulmonary [place]  PROGRESS NOTE     Debra Ryan  [id number]  DOB: 15-May-1941   DOA: 10/31/2022  Referring Physician: Satira Sark, MD  HPI: Debra Ryan is a 82 y.o. female being followed for ventilator/airway/oxygen weaning Acute on Chronic Respiratory Failure.  Patient seen lying in bed, currently on BiPAP.  Current complaints of increased shortness of breath.  I have ordered a one-time chest x-ray reevaluate effusions.  Patient continues to request multiple methods of oxygen.  Medications: Reviewed on Rounds  Physical Exam:  Vitals: Temp 96.0, pulse 68, respirations 16, BP 112/60, SpO2 97%  Ventilator Settings BiPAP 12/5 FiO2 40%  General: Comfortable at this time Neck: supple Cardiovascular: no malignant arrhythmias Respiratory: Bilaterally diminished Skin: no rash seen on limited exam Musculoskeletal: No gross abnormality Psychiatric:unable to assess Neurologic:no involuntary movements         Lab Data:   Basic Metabolic Panel: Recent Labs  Lab 12/06/22 0145 12/09/22 0205  NA 139 135  K 3.9 4.3  CL 98 93*  CO2 33* 33*  GLUCOSE 105* 154*  BUN 30* 25*  CREATININE 1.28* 1.48*  CALCIUM 8.1* 8.0*  MG 2.0 1.9    ABG: No results for input(s): "PHART", "PCO2ART", "PO2ART", "HCO3", "O2SAT" in the last 168 hours.  Liver Function Tests: No results for input(s): "AST", "ALT", "ALKPHOS", "BILITOT", "PROT", "ALBUMIN" in the last 168 hours. No results for input(s): "LIPASE", "AMYLASE" in the last 168 hours. No results for input(s): "AMMONIA" in the last 168 hours.  CBC: Recent Labs  Lab 12/06/22 0145 12/09/22 0205  WBC 8.5 7.0  HGB 10.4* 9.3*  HCT 32.6* 30.2*  MCV 97.0 99.3  PLT 154 162    Cardiac Enzymes: No results for input(s): "CKTOTAL", "CKMB", "CKMBINDEX", "TROPONINI" in the last 168 hours.  BNP (last 3 results) Recent Labs    08/21/22 0323  11/19/22 1617 11/20/22 0143  BNP 461.9* 512.4* 524.6*    ProBNP (last 3 results) No results for input(s): "PROBNP" in the last 8760 hours.  Radiological Exams: DG Chest Port 1 View  Result Date: 12/10/2022 CLINICAL DATA:  Shortness of breath EXAM: PORTABLE CHEST 1 VIEW COMPARISON:  12/07/2022 FINDINGS: Stable heart size, enlarged. Aortic atherosclerosis. Low lung volumes. Right worse than left bibasilar opacities with small bilateral pleural effusions. Scattered nodular airspace opacities bilaterally compatible with known pulmonary metastatic disease. IMPRESSION: 1. Right worse than left bibasilar opacities with small bilateral pleural effusions. Overall, little interval change from prior. 2. Scattered nodular airspace opacities bilaterally compatible with known pulmonary metastatic disease. Electronically Signed   By: Davina Poke D.O.   On: 12/10/2022 14:23    Assessment/Plan Active Problems:   Acute on chronic respiratory failure with hypoxia (HCC)   Bronchopneumonia   Acute on chronic combined systolic and diastolic CHF (congestive heart failure) (HCC)   Chronic pain syndrome   Chronic atrial fibrillation with RVR (HCC)   Acute on chronic respiratory failure hypoxia-currently on BiPAP.  Continue BiPAP, heated high flow, nasal cannula per patient's request to comfort. Lobar pneumonia no change supportive care will continue to monitor Chronic combined systolic diastolic heart failure supportive care Chronic pain syndrome pain management will continue to monitor Chronic atrial fibrillation rate is controlled   I have personally seen and evaluated the patient, evaluated laboratory and imaging results, formulated the assessment and plan and placed orders. The Patient requires high complexity decision making with multiple systems involvement.  Rounds were  done with the Respiratory Therapy Director and Staff therapists and discussed with nursing staff also.  Allyne Gee, MD  [facility] Pulmonary Critical Care Medicine Sleep Medicine

## 2022-12-11 ENCOUNTER — Other Ambulatory Visit (HOSPITAL_COMMUNITY): Payer: Medicare Other

## 2022-12-11 DIAGNOSIS — J9621 Acute and chronic respiratory failure with hypoxia: Secondary | ICD-10-CM | POA: Diagnosis not present

## 2022-12-11 DIAGNOSIS — J180 Bronchopneumonia, unspecified organism: Secondary | ICD-10-CM | POA: Diagnosis not present

## 2022-12-11 DIAGNOSIS — I5043 Acute on chronic combined systolic (congestive) and diastolic (congestive) heart failure: Secondary | ICD-10-CM | POA: Diagnosis not present

## 2022-12-11 DIAGNOSIS — I482 Chronic atrial fibrillation, unspecified: Secondary | ICD-10-CM | POA: Diagnosis not present

## 2022-12-12 DIAGNOSIS — J180 Bronchopneumonia, unspecified organism: Secondary | ICD-10-CM | POA: Diagnosis not present

## 2022-12-12 DIAGNOSIS — J9621 Acute and chronic respiratory failure with hypoxia: Secondary | ICD-10-CM | POA: Diagnosis not present

## 2022-12-12 DIAGNOSIS — I5043 Acute on chronic combined systolic (congestive) and diastolic (congestive) heart failure: Secondary | ICD-10-CM | POA: Diagnosis not present

## 2022-12-12 DIAGNOSIS — I482 Chronic atrial fibrillation, unspecified: Secondary | ICD-10-CM | POA: Diagnosis not present

## 2022-12-12 LAB — CBC
HCT: 27.6 % — ABNORMAL LOW (ref 36.0–46.0)
Hemoglobin: 8.9 g/dL — ABNORMAL LOW (ref 12.0–15.0)
MCH: 31.1 pg (ref 26.0–34.0)
MCHC: 32.2 g/dL (ref 30.0–36.0)
MCV: 96.5 fL (ref 80.0–100.0)
Platelets: 171 10*3/uL (ref 150–400)
RBC: 2.86 MIL/uL — ABNORMAL LOW (ref 3.87–5.11)
RDW: 14.2 % (ref 11.5–15.5)
WBC: 6.3 10*3/uL (ref 4.0–10.5)
nRBC: 0 % (ref 0.0–0.2)

## 2022-12-12 LAB — BASIC METABOLIC PANEL
Anion gap: 7 (ref 5–15)
BUN: 23 mg/dL (ref 8–23)
CO2: 35 mmol/L — ABNORMAL HIGH (ref 22–32)
Calcium: 8.2 mg/dL — ABNORMAL LOW (ref 8.9–10.3)
Chloride: 95 mmol/L — ABNORMAL LOW (ref 98–111)
Creatinine, Ser: 1.22 mg/dL — ABNORMAL HIGH (ref 0.44–1.00)
GFR, Estimated: 45 mL/min — ABNORMAL LOW (ref >60)
Glucose, Bld: 145 mg/dL — ABNORMAL HIGH (ref 70–99)
Potassium: 4 mmol/L (ref 3.5–5.1)
Sodium: 137 mmol/L (ref 135–145)

## 2022-12-13 DIAGNOSIS — J180 Bronchopneumonia, unspecified organism: Secondary | ICD-10-CM | POA: Diagnosis not present

## 2022-12-13 DIAGNOSIS — J9621 Acute and chronic respiratory failure with hypoxia: Secondary | ICD-10-CM | POA: Diagnosis not present

## 2022-12-13 DIAGNOSIS — I5043 Acute on chronic combined systolic (congestive) and diastolic (congestive) heart failure: Secondary | ICD-10-CM | POA: Diagnosis not present

## 2022-12-13 DIAGNOSIS — I482 Chronic atrial fibrillation, unspecified: Secondary | ICD-10-CM | POA: Diagnosis not present

## 2022-12-13 LAB — MISC LABCORP TEST (SEND OUT): Labcorp test code: 9985

## 2022-12-13 NOTE — Progress Notes (Addendum)
Pulmonary [place]  PROGRESS NOTE     Debra Ryan  [id number]  DOB: [birth year]   DOA: 11/20/2022  Referring Physician: Satira Sark, MD  HPI: Debra Ryan is a 82 y.o. female being followed for ventilator/airway/oxygen weaning Acute on Chronic Respiratory Failure.  Patient seen lying in bed, currently on BiPAP.  According to respiratory therapy patient has requested to be on continuous BiPAP with the exception at times of eating.  Medications: Reviewed on Rounds  Physical Exam:  Vitals: Temp 97.5, pulse 79, respirations 19, BP 133/74, SpO2 96%  Ventilator Settings BiPAP 14/6 FiO2 35%  General: Comfortable at this time Neck: supple Cardiovascular: no malignant arrhythmias Respiratory: Bilaterally diminished Skin: no rash seen on limited exam Musculoskeletal: No gross abnormality Psychiatric:unable to assess Neurologic:no involuntary movements         Lab Data:   Basic Metabolic Panel: Recent Labs  Lab 12/06/22 0145 12/09/22 0205 12/12/22 0158  NA 139 135 137  K 3.9 4.3 4.0  CL 98 93* 95*  CO2 33* 33* 35*  GLUCOSE 105* 154* 145*  BUN 30* 25* 23  CREATININE 1.28* 1.48* 1.22*  CALCIUM 8.1* 8.0* 8.2*  MG 2.0 1.9  --     ABG: No results for input(s): "PHART", "PCO2ART", "PO2ART", "HCO3", "O2SAT" in the last 168 hours.  Liver Function Tests: No results for input(s): "AST", "ALT", "ALKPHOS", "BILITOT", "PROT", "ALBUMIN" in the last 168 hours. No results for input(s): "LIPASE", "AMYLASE" in the last 168 hours. No results for input(s): "AMMONIA" in the last 168 hours.  CBC: Recent Labs  Lab 12/06/22 0145 12/09/22 0205 12/12/22 0158  WBC 8.5 7.0 6.3  HGB 10.4* 9.3* 8.9*  HCT 32.6* 30.2* 27.6*  MCV 97.0 99.3 96.5  PLT 154 162 171    Cardiac Enzymes: No results for input(s): "CKTOTAL", "CKMB", "CKMBINDEX", "TROPONINI" in the last 168 hours.  BNP (last 3  results) Recent Labs    08/21/22 0323 11/19/22 1617 11/20/22 0143  BNP 461.9* 512.4* 524.6*    ProBNP (last 3 results) No results for input(s): "PROBNP" in the last 8760 hours.  Radiological Exams: DG CHEST PORT 1 VIEW  Result Date: 12/11/2022 CLINICAL DATA:  Edema and pleural effusion EXAM: PORTABLE CHEST 1 VIEW COMPARISON:  Yesterday FINDINGS: Hazy appearance of the right more than left chest is attributed to edema and layering pleural effusions. Superimposed spiculated nodular densities by most recent chest CT. Cardiomegaly and vascular pedicle widening distorted by rightward rotation. IMPRESSION: Similar extensive opacification of the chest primarily related to layering pleural effusions. Underlying interstitial edema and obscured pulmonary metastatic disease. Electronically Signed   By: Jorje Guild M.D.   On: 12/11/2022 07:19    Assessment/Plan Active Problems:   Acute on chronic respiratory failure with hypoxia (HCC)   Bronchopneumonia   Acute on chronic combined systolic and diastolic CHF (congestive heart failure) (HCC)   Chronic pain syndrome   Chronic atrial fibrillation with RVR (HCC)   Acute on chronic respiratory failure hypoxia-currently on BiPAP.  Continue BiPAP, heated high flow, nasal cannula per patient's request to comfort. Lobar pneumonia no change supportive care will continue to monitor Chronic combined systolic diastolic heart failure supportive care Chronic pain syndrome pain management will continue to monitor Chronic atrial fibrillation rate is controlled   I have personally seen and evaluated the patient, evaluated laboratory and imaging results, formulated the assessment and plan and placed orders. The Patient requires high complexity decision  making with multiple systems involvement.  Rounds were done with the Respiratory Therapy Director and Staff therapists and discussed with nursing staff also.  Allyne Gee, MD [facility] Pulmonary Critical Care  Medicine Sleep Medicine

## 2022-12-13 NOTE — Progress Notes (Addendum)
Pulmonary [place]  PROGRESS NOTE     AREMY Ryan  [id number]  DOB: May 31, 1941   DOA: 11/10/2022  Referring Physician: Satira Sark, MD  HPI: Debra Ryan is a 82 y.o. female being followed for ventilator/airway/oxygen weaning Acute on Chronic Respiratory Failure.  Patient seen lying in bed, family at bedside.  Patient currently on BiPAP 14/6 FiO2 35%.  Patient continues to complain of shortness of breath with heated high flow.  Medications: Reviewed on Rounds  Physical Exam:  Vitals: Temp 97.6, pulse 74, respirations 15, BP 114/70, SpO2 98%  Ventilator Settings BiPAP 14/6 FiO2 35%  General: Comfortable at this time Neck: supple Cardiovascular: no malignant arrhythmias Respiratory: Bilaterally diminished Skin: no rash seen on limited exam Musculoskeletal: No gross abnormality Psychiatric:unable to assess Neurologic:no involuntary movements         Lab Data:   Basic Metabolic Panel: Recent Labs  Lab 12/06/22 0145 12/09/22 0205 12/12/22 0158  NA 139 135 137  K 3.9 4.3 4.0  CL 98 93* 95*  CO2 33* 33* 35*  GLUCOSE 105* 154* 145*  BUN 30* 25* 23  CREATININE 1.28* 1.48* 1.22*  CALCIUM 8.1* 8.0* 8.2*  MG 2.0 1.9  --     ABG: No results for input(s): "PHART", "PCO2ART", "PO2ART", "HCO3", "O2SAT" in the last 168 hours.  Liver Function Tests: No results for input(s): "AST", "ALT", "ALKPHOS", "BILITOT", "PROT", "ALBUMIN" in the last 168 hours. No results for input(s): "LIPASE", "AMYLASE" in the last 168 hours. No results for input(s): "AMMONIA" in the last 168 hours.  CBC: Recent Labs  Lab 12/06/22 0145 12/09/22 0205 12/12/22 0158  WBC 8.5 7.0 6.3  HGB 10.4* 9.3* 8.9*  HCT 32.6* 30.2* 27.6*  MCV 97.0 99.3 96.5  PLT 154 162 171    Cardiac Enzymes: No results for input(s): "CKTOTAL", "CKMB", "CKMBINDEX", "TROPONINI" in the last 168 hours.  BNP (last 3  results) Recent Labs    08/21/22 0323 11/19/22 1617 11/20/22 0143  BNP 461.9* 512.4* 524.6*    ProBNP (last 3 results) No results for input(s): "PROBNP" in the last 8760 hours.  Radiological Exams: DG CHEST PORT 1 VIEW  Result Date: 12/11/2022 CLINICAL DATA:  Edema and pleural effusion EXAM: PORTABLE CHEST 1 VIEW COMPARISON:  Yesterday FINDINGS: Hazy appearance of the right more than left chest is attributed to edema and layering pleural effusions. Superimposed spiculated nodular densities by most recent chest CT. Cardiomegaly and vascular pedicle widening distorted by rightward rotation. IMPRESSION: Similar extensive opacification of the chest primarily related to layering pleural effusions. Underlying interstitial edema and obscured pulmonary metastatic disease. Electronically Signed   By: Jorje Guild M.D.   On: 12/11/2022 07:19    Assessment/Plan Active Problems:   Acute on chronic respiratory failure with hypoxia (HCC)   Bronchopneumonia   Acute on chronic combined systolic and diastolic CHF (congestive heart failure) (HCC)   Chronic pain syndrome   Chronic atrial fibrillation with RVR (HCC)   Acute on chronic respiratory failure hypoxia-currently on BiPAP.  Continue BiPAP, heated high flow, nasal cannula per patient's request to comfort. Lobar pneumonia no change supportive care will continue to monitor Chronic combined systolic diastolic heart failure supportive care Chronic pain syndrome pain management will continue to monitor Chronic atrial fibrillation rate is controlled   I have personally seen and evaluated the patient, evaluated laboratory and imaging results, formulated the assessment and plan and placed orders. The Patient requires high complexity  decision making with multiple systems involvement.  Rounds were done with the Respiratory Therapy Director and Staff therapists and discussed with nursing staff also.  Allyne Gee, MD [facility] Pulmonary Critical Care  Medicine Sleep Medicine

## 2022-12-13 NOTE — Progress Notes (Incomplete)
Pulmonary [place]  PROGRESS NOTE     Debra Ryan  [id number]  DOB: [birth year]   DOA: 11/17/2022  Referring Physician: Satira Sark, MD  HPI: Debra Ryan is a 82 y.o. female being followed for ventilator/airway/oxygen weaning Acute on Chronic Respiratory Failure. ***  Medications: Reviewed on Rounds  Physical Exam:  Vitals: ***  Ventilator Settings ***  General: Comfortable at this time Neck: supple Cardiovascular: no malignant arrhythmias Respiratory: *** Skin: no rash seen on limited exam Musculoskeletal: No gross abnormality Psychiatric:unable to assess Neurologic:no involuntary movements         Lab Data:   Basic Metabolic Panel: Recent Labs  Lab 12/09/22 0205 12/12/22 0158  NA 135 137  K 4.3 4.0  CL 93* 95*  CO2 33* 35*  GLUCOSE 154* 145*  BUN 25* 23  CREATININE 1.48* 1.22*  CALCIUM 8.0* 8.2*  MG 1.9  --     ABG: No results for input(s): "PHART", "PCO2ART", "PO2ART", "HCO3", "O2SAT" in the last 168 hours.  Liver Function Tests: No results for input(s): "AST", "ALT", "ALKPHOS", "BILITOT", "PROT", "ALBUMIN" in the last 168 hours. No results for input(s): "LIPASE", "AMYLASE" in the last 168 hours. No results for input(s): "AMMONIA" in the last 168 hours.  CBC: Recent Labs  Lab 12/09/22 0205 12/12/22 0158  WBC 7.0 6.3  HGB 9.3* 8.9*  HCT 30.2* 27.6*  MCV 99.3 96.5  PLT 162 171    Cardiac Enzymes: No results for input(s): "CKTOTAL", "CKMB", "CKMBINDEX", "TROPONINI" in the last 168 hours.  BNP (last 3 results) Recent Labs    08/21/22 0323 11/19/22 1617 11/20/22 0143  BNP 461.9* 512.4* 524.6*    ProBNP (last 3 results) No results for input(s): "PROBNP" in the last 8760 hours.  Radiological Exams: DG CHEST PORT 1 VIEW  Result Date: 12/11/2022 CLINICAL DATA:  Edema and pleural effusion EXAM: PORTABLE CHEST 1 VIEW COMPARISON:   Yesterday FINDINGS: Hazy appearance of the right more than left chest is attributed to edema and layering pleural effusions. Superimposed spiculated nodular densities by most recent chest CT. Cardiomegaly and vascular pedicle widening distorted by rightward rotation. IMPRESSION: Similar extensive opacification of the chest primarily related to layering pleural effusions. Underlying interstitial edema and obscured pulmonary metastatic disease. Electronically Signed   By: Jorje Guild M.D.   On: 12/11/2022 07:19    Assessment/Plan Active Problems:   Acute on chronic respiratory failure with hypoxia (HCC)   Bronchopneumonia   Acute on chronic combined systolic and diastolic CHF (congestive heart failure) (HCC)   Chronic pain syndrome   Chronic atrial fibrillation with RVR (HCC)  Acute on chronic respiratory failure hypoxia-currently on BiPAP.  Continue BiPAP, heated high flow, nasal cannula per patient's request to comfort. Lobar pneumonia no change supportive care will continue to monitor Chronic combined systolic diastolic heart failure supportive care Chronic pain syndrome pain management will continue to monitor Chronic atrial fibrillation rate is controlled  I have personally seen and evaluated the patient, evaluated laboratory and imaging results, formulated the assessment and plan and placed orders. The Patient requires high complexity decision making with multiple systems involvement.  Rounds were done with the Respiratory Therapy Director and Staff therapists and discussed with nursing staff also.  Allyne Gee, MD [facility] Pulmonary Critical Care Medicine Sleep Medicine

## 2022-12-14 DIAGNOSIS — J180 Bronchopneumonia, unspecified organism: Secondary | ICD-10-CM | POA: Diagnosis not present

## 2022-12-14 DIAGNOSIS — I5043 Acute on chronic combined systolic (congestive) and diastolic (congestive) heart failure: Secondary | ICD-10-CM | POA: Diagnosis not present

## 2022-12-14 DIAGNOSIS — J9621 Acute and chronic respiratory failure with hypoxia: Secondary | ICD-10-CM | POA: Diagnosis not present

## 2022-12-14 DIAGNOSIS — I482 Chronic atrial fibrillation, unspecified: Secondary | ICD-10-CM | POA: Diagnosis not present

## 2022-12-14 NOTE — Progress Notes (Incomplete)
Pulmonary [place]  PROGRESS NOTE     Debra Ryan  [id number]  DOB: Apr 01, 1941   DOA: 11/10/2022  Referring Physician: Satira Sark, MD  HPI: Debra Ryan is a 82 y.o. female being followed for ventilator/airway/oxygen weaning Acute on Chronic Respiratory Failure. ***  Medications: Reviewed on Rounds  Physical Exam:  Vitals: ***  Ventilator Settings ***  General: Comfortable at this time Neck: supple Cardiovascular: no malignant arrhythmias Respiratory: *** Skin: no rash seen on limited exam Musculoskeletal: No gross abnormality Psychiatric:unable to assess Neurologic:no involuntary movements         Lab Data:   Basic Metabolic Panel: Recent Labs  Lab 12/09/22 0205 12/12/22 0158  NA 135 137  K 4.3 4.0  CL 93* 95*  CO2 33* 35*  GLUCOSE 154* 145*  BUN 25* 23  CREATININE 1.48* 1.22*  CALCIUM 8.0* 8.2*  MG 1.9  --     ABG: No results for input(s): "PHART", "PCO2ART", "PO2ART", "HCO3", "O2SAT" in the last 168 hours.  Liver Function Tests: No results for input(s): "AST", "ALT", "ALKPHOS", "BILITOT", "PROT", "ALBUMIN" in the last 168 hours. No results for input(s): "LIPASE", "AMYLASE" in the last 168 hours. No results for input(s): "AMMONIA" in the last 168 hours.  CBC: Recent Labs  Lab 12/09/22 0205 12/12/22 0158  WBC 7.0 6.3  HGB 9.3* 8.9*  HCT 30.2* 27.6*  MCV 99.3 96.5  PLT 162 171    Cardiac Enzymes: No results for input(s): "CKTOTAL", "CKMB", "CKMBINDEX", "TROPONINI" in the last 168 hours.  BNP (last 3 results) Recent Labs    08/21/22 0323 11/19/22 1617 11/20/22 0143  BNP 461.9* 512.4* 524.6*    ProBNP (last 3 results) No results for input(s): "PROBNP" in the last 8760 hours.  Radiological Exams: No results found.  Assessment/Plan Active Problems:   Acute on chronic respiratory failure with hypoxia (HCC)   Bronchopneumonia    Acute on chronic combined systolic and diastolic CHF (congestive heart failure) (HCC)   Chronic pain syndrome   Chronic atrial fibrillation with RVR (HCC)   Acute on chronic respiratory failure hypoxia-currently on BiPAP.  Continue BiPAP, heated high flow, nasal cannula per patient's request to comfort. Lobar pneumonia no change supportive care will continue to monitor Chronic combined systolic diastolic heart failure supportive care Chronic pain syndrome pain management will continue to monitor Chronic atrial fibrillation rate is controlled   I have personally seen and evaluated the patient, evaluated laboratory and imaging results, formulated the assessment and plan and placed orders. The Patient requires high complexity decision making with multiple systems involvement.  Rounds were done with the Respiratory Therapy Director and Staff therapists and discussed with nursing staff also.  Allyne Gee, MD [facility] Pulmonary Critical Care Medicine Sleep Medicine

## 2022-12-15 DIAGNOSIS — J180 Bronchopneumonia, unspecified organism: Secondary | ICD-10-CM | POA: Diagnosis not present

## 2022-12-15 DIAGNOSIS — I5043 Acute on chronic combined systolic (congestive) and diastolic (congestive) heart failure: Secondary | ICD-10-CM | POA: Diagnosis not present

## 2022-12-15 DIAGNOSIS — J9621 Acute and chronic respiratory failure with hypoxia: Secondary | ICD-10-CM | POA: Diagnosis not present

## 2022-12-15 DIAGNOSIS — I482 Chronic atrial fibrillation, unspecified: Secondary | ICD-10-CM | POA: Diagnosis not present

## 2022-12-15 NOTE — Progress Notes (Signed)
Pulmonary [place]  PROGRESS NOTE     NICOLI ROAM  [id number]  DOB: 02/02/1941   DOA: 11/10/2022  Referring Physician: Satira Sark, MD  HPI: Debra Ryan is a 82 y.o. female being followed for ventilator/airway/oxygen weaning Acute on Chronic Respiratory Failure.  Patient is resting comfortably at this time without distress has been basically BiPAP dependent  Medications: Reviewed on Rounds  Physical Exam:  Vitals: Temperature is 97 pulse 62 respiratory 27 blood pressure is 138/60 saturation is 100%  Ventilator Settings on the BiPAP continuously  General: Comfortable at this time Neck: supple Cardiovascular: no malignant arrhythmias Respiratory: Scattered coarse rhonchi Skin: no rash seen on limited exam Musculoskeletal: No gross abnormality Psychiatric:unable to assess Neurologic:no involuntary movements         Lab Data:   Basic Metabolic Panel: Recent Labs  Lab 12/09/22 0205 12/12/22 0158  NA 135 137  K 4.3 4.0  CL 93* 95*  CO2 33* 35*  GLUCOSE 154* 145*  BUN 25* 23  CREATININE 1.48* 1.22*  CALCIUM 8.0* 8.2*  MG 1.9  --     ABG: No results for input(s): "PHART", "PCO2ART", "PO2ART", "HCO3", "O2SAT" in the last 168 hours.  Liver Function Tests: No results for input(s): "AST", "ALT", "ALKPHOS", "BILITOT", "PROT", "ALBUMIN" in the last 168 hours. No results for input(s): "LIPASE", "AMYLASE" in the last 168 hours. No results for input(s): "AMMONIA" in the last 168 hours.  CBC: Recent Labs  Lab 12/09/22 0205 12/12/22 0158  WBC 7.0 6.3  HGB 9.3* 8.9*  HCT 30.2* 27.6*  MCV 99.3 96.5  PLT 162 171    Cardiac Enzymes: No results for input(s): "CKTOTAL", "CKMB", "CKMBINDEX", "TROPONINI" in the last 168 hours.  BNP (last 3 results) Recent Labs    08/21/22 0323 11/19/22 1617 11/20/22 0143  BNP 461.9* 512.4* 524.6*    ProBNP (last 3  results) No results for input(s): "PROBNP" in the last 8760 hours.  Radiological Exams: No results found.  Assessment/Plan Active Problems:   Acute on chronic respiratory failure with hypoxia (HCC)   Bronchopneumonia   Acute on chronic combined systolic and diastolic CHF (congestive heart failure) (HCC)   Chronic pain syndrome   Chronic atrial fibrillation with RVR (HCC)   Acute on chronic respiratory failure with hypoxia as she is not improving do not expect her to improve I did speak with the patient as well as the husband regarding overall prognosis.  I explained to them the findings of the CT scan with multiple metastatic nodules.  They were still confused I reviewed the CT with them personally so as they have a better understanding of what is going on.  I gave them the option of the possibility hospice care however she does not want to go home to die as not to be a burden on her husband Bronchopneumonia no change will continue to follow along closely at this time Chronic pain syndrome pain management will continue to monitor along closely. Systolic diastolic heart failure prognosis guarded Chronic atrial fibrillation rate is controlled   I have personally seen and evaluated the patient, evaluated laboratory and imaging results, formulated the assessment and plan and placed orders. The Patient requires high complexity decision making with multiple systems involvement.  Rounds were done with the Respiratory Therapy Director and Staff therapists and discussed with nursing staff also.  Allyne Gee, MD [facility] Pulmonary Critical Care Medicine Sleep Medicine

## 2022-12-16 DIAGNOSIS — I5043 Acute on chronic combined systolic (congestive) and diastolic (congestive) heart failure: Secondary | ICD-10-CM | POA: Diagnosis not present

## 2022-12-16 DIAGNOSIS — J9621 Acute and chronic respiratory failure with hypoxia: Secondary | ICD-10-CM | POA: Diagnosis not present

## 2022-12-16 DIAGNOSIS — J180 Bronchopneumonia, unspecified organism: Secondary | ICD-10-CM | POA: Diagnosis not present

## 2022-12-16 DIAGNOSIS — I482 Chronic atrial fibrillation, unspecified: Secondary | ICD-10-CM | POA: Diagnosis not present

## 2022-12-16 LAB — CBC
HCT: 28.7 % — ABNORMAL LOW (ref 36.0–46.0)
Hemoglobin: 9 g/dL — ABNORMAL LOW (ref 12.0–15.0)
MCH: 31 pg (ref 26.0–34.0)
MCHC: 31.4 g/dL (ref 30.0–36.0)
MCV: 99 fL (ref 80.0–100.0)
Platelets: 185 10*3/uL (ref 150–400)
RBC: 2.9 MIL/uL — ABNORMAL LOW (ref 3.87–5.11)
RDW: 14.6 % (ref 11.5–15.5)
WBC: 5.6 10*3/uL (ref 4.0–10.5)
nRBC: 0 % (ref 0.0–0.2)

## 2022-12-16 LAB — BASIC METABOLIC PANEL
Anion gap: 8 (ref 5–15)
BUN: 29 mg/dL — ABNORMAL HIGH (ref 8–23)
CO2: 33 mmol/L — ABNORMAL HIGH (ref 22–32)
Calcium: 8.4 mg/dL — ABNORMAL LOW (ref 8.9–10.3)
Chloride: 96 mmol/L — ABNORMAL LOW (ref 98–111)
Creatinine, Ser: 1.22 mg/dL — ABNORMAL HIGH (ref 0.44–1.00)
GFR, Estimated: 45 mL/min — ABNORMAL LOW (ref >60)
Glucose, Bld: 121 mg/dL — ABNORMAL HIGH (ref 70–99)
Potassium: 3.6 mmol/L (ref 3.5–5.1)
Sodium: 137 mmol/L (ref 135–145)

## 2022-12-16 LAB — MAGNESIUM: Magnesium: 2 mg/dL (ref 1.7–2.4)

## 2022-12-16 NOTE — Progress Notes (Signed)
Pulmonary [place]  PROGRESS NOTE     WINNI KAMSTRA  [id number]  DOB: [birth year]   DOA: 11/07/2022  Referring Physician: Satira Sark, MD  HPI: Debra Ryan is a 82 y.o. female being followed for ventilator/airway/oxygen weaning Acute on Chronic Respiratory Failure. ***  Medications: Reviewed on Rounds  Physical Exam:  Vitals: ***  Ventilator Settings ***  General: Comfortable at this time Neck: supple Cardiovascular: no malignant arrhythmias Respiratory: *** Skin: no rash seen on limited exam Musculoskeletal: No gross abnormality Psychiatric:unable to assess Neurologic:no involuntary movements         Lab Data:   Basic Metabolic Panel: Recent Labs  Lab 12/12/22 0158 12/16/22 0225  NA 137 137  K 4.0 3.6  CL 95* 96*  CO2 35* 33*  GLUCOSE 145* 121*  BUN 23 29*  CREATININE 1.22* 1.22*  CALCIUM 8.2* 8.4*  MG  --  2.0    ABG: No results for input(s): "PHART", "PCO2ART", "PO2ART", "HCO3", "O2SAT" in the last 168 hours.  Liver Function Tests: No results for input(s): "AST", "ALT", "ALKPHOS", "BILITOT", "PROT", "ALBUMIN" in the last 168 hours. No results for input(s): "LIPASE", "AMYLASE" in the last 168 hours. No results for input(s): "AMMONIA" in the last 168 hours.  CBC: Recent Labs  Lab 12/12/22 0158 12/16/22 0225  WBC 6.3 5.6  HGB 8.9* 9.0*  HCT 27.6* 28.7*  MCV 96.5 99.0  PLT 171 185    Cardiac Enzymes: No results for input(s): "CKTOTAL", "CKMB", "CKMBINDEX", "TROPONINI" in the last 168 hours.  BNP (last 3 results) Recent Labs    08/21/22 0323 11/19/22 1617 11/20/22 0143  BNP 461.9* 512.4* 524.6*    ProBNP (last 3 results) No results for input(s): "PROBNP" in the last 8760 hours.  Radiological Exams: No results found.  Assessment/Plan Active Problems:   Acute on chronic respiratory failure with hypoxia (HCC)   Bronchopneumonia    Acute on chronic combined systolic and diastolic CHF (congestive heart failure) (HCC)   Chronic pain syndrome   Chronic atrial fibrillation with RVR (HCC)   *** *** *** *** ***   I have personally seen and evaluated the patient, evaluated laboratory and imaging results, formulated the assessment and plan and placed orders. The Patient requires high complexity decision making with multiple systems involvement.  Rounds were done with the Respiratory Therapy Director and Staff therapists and discussed with nursing staff also.  Allyne Gee, MD [facility] Pulmonary Critical Care Medicine Sleep Medicine

## 2022-12-17 DIAGNOSIS — I482 Chronic atrial fibrillation, unspecified: Secondary | ICD-10-CM | POA: Diagnosis not present

## 2022-12-17 DIAGNOSIS — I5043 Acute on chronic combined systolic (congestive) and diastolic (congestive) heart failure: Secondary | ICD-10-CM | POA: Diagnosis not present

## 2022-12-17 DIAGNOSIS — J9621 Acute and chronic respiratory failure with hypoxia: Secondary | ICD-10-CM | POA: Diagnosis not present

## 2022-12-17 DIAGNOSIS — J180 Bronchopneumonia, unspecified organism: Secondary | ICD-10-CM | POA: Diagnosis not present

## 2022-12-17 NOTE — Progress Notes (Signed)
Pulmonary [place]  PROGRESS NOTE     Debra Ryan  [id number]  DOB: [birth year]   DOA: 11/09/2022  Referring Physician: Satira Sark, MD  HPI: Debra Ryan is a 82 y.o. female being followed for ventilator/airway/oxygen weaning Acute on Chronic Respiratory Failure.  Patient is currently on BiPAP 16/6 and 35% FiO2 saturations are good  Medications: Reviewed on Rounds  Physical Exam:  Vitals: Temperature is 96.7 pulse 89 respiratory is 21 blood pressure 134/79 saturation is 99%  Ventilator Settings on the BiPAP FiO2 is 35% she is BiPAP dependent  General: Comfortable at this time Neck: supple Cardiovascular: no malignant arrhythmias Respiratory: Scattered coarse rhonchi Skin: no rash seen on limited exam Musculoskeletal: No gross abnormality Psychiatric:unable to assess Neurologic:no involuntary movements         Lab Data:   Basic Metabolic Panel: Recent Labs  Lab 12/12/22 0158 12/16/22 0225  NA 137 137  K 4.0 3.6  CL 95* 96*  CO2 35* 33*  GLUCOSE 145* 121*  BUN 23 29*  CREATININE 1.22* 1.22*  CALCIUM 8.2* 8.4*  MG  --  2.0    ABG: No results for input(s): "PHART", "PCO2ART", "PO2ART", "HCO3", "O2SAT" in the last 168 hours.  Liver Function Tests: No results for input(s): "AST", "ALT", "ALKPHOS", "BILITOT", "PROT", "ALBUMIN" in the last 168 hours. No results for input(s): "LIPASE", "AMYLASE" in the last 168 hours. No results for input(s): "AMMONIA" in the last 168 hours.  CBC: Recent Labs  Lab 12/12/22 0158 12/16/22 0225  WBC 6.3 5.6  HGB 8.9* 9.0*  HCT 27.6* 28.7*  MCV 96.5 99.0  PLT 171 185    Cardiac Enzymes: No results for input(s): "CKTOTAL", "CKMB", "CKMBINDEX", "TROPONINI" in the last 168 hours.  BNP (last 3 results) Recent Labs    08/21/22 0323 11/19/22 1617 11/20/22 0143  BNP 461.9* 512.4* 524.6*    ProBNP (last 3 results) No  results for input(s): "PROBNP" in the last 8760 hours.  Radiological Exams: No results found.  Assessment/Plan Active Problems:   Acute on chronic respiratory failure with hypoxia (HCC)   Bronchopneumonia   Acute on chronic combined systolic and diastolic CHF (congestive heart failure) (HCC)   Chronic pain syndrome   Chronic atrial fibrillation with RVR (HCC)   Acute on chronic respiratory failure hypoxia continue with BiPAP as needed.  She needs to have breaks in between however she is becoming more and more BiPAP dependent patient is DNR Bronchopneumonia has been treated with antibiotics patient has multiple nodules however indicative of metastatic carcinoma Chronic pain syndrome pain management will continue to follow along Chronic atrial fibrillation rate is controlled at this time Combined systolic diastolic heart failure no change   I have personally seen and evaluated the patient, evaluated laboratory and imaging results, formulated the assessment and plan and placed orders. The Patient requires high complexity decision making with multiple systems involvement.  Rounds were done with the Respiratory Therapy Director and Staff therapists and discussed with nursing staff also.  Allyne Gee, MD [facility] Pulmonary Critical Care Medicine Sleep Medicine

## 2022-12-18 DIAGNOSIS — I482 Chronic atrial fibrillation, unspecified: Secondary | ICD-10-CM | POA: Diagnosis not present

## 2022-12-18 DIAGNOSIS — J9621 Acute and chronic respiratory failure with hypoxia: Secondary | ICD-10-CM | POA: Diagnosis not present

## 2022-12-18 DIAGNOSIS — J180 Bronchopneumonia, unspecified organism: Secondary | ICD-10-CM | POA: Diagnosis not present

## 2022-12-18 DIAGNOSIS — I5043 Acute on chronic combined systolic (congestive) and diastolic (congestive) heart failure: Secondary | ICD-10-CM | POA: Diagnosis not present

## 2022-12-18 NOTE — Progress Notes (Signed)
Pulmonary [place]  PROGRESS NOTE     TERRILEE LAVERDURE  [id number]  DOB: [birth year]   DOA: 11/16/2022  Referring Physician: Satira Sark, MD  HPI: Debra Ryan is a 82 y.o. female being followed for ventilator/airway/oxygen weaning Acute on Chronic Respiratory Failure.  Patient has been on the BiPAP she has basically BiPAP dependent  Medications: Reviewed on Rounds  Physical Exam:  Vitals: Temperature is 97.1 pulse of 79 respiratory 27 blood pressure 140/42 saturation is 96%  Ventilator Settings she remains on BiPAP 16/6 FiO2 35% tidal volume 497  General: Comfortable at this time Neck: supple Cardiovascular: no malignant arrhythmias Respiratory: Scattered coarse rhonchi are noted bilaterally Skin: no rash seen on limited exam Musculoskeletal: No gross abnormality Psychiatric:unable to assess Neurologic:no involuntary movements         Lab Data:   Basic Metabolic Panel: Recent Labs  Lab 12/12/22 0158 12/16/22 0225  NA 137 137  K 4.0 3.6  CL 95* 96*  CO2 35* 33*  GLUCOSE 145* 121*  BUN 23 29*  CREATININE 1.22* 1.22*  CALCIUM 8.2* 8.4*  MG  --  2.0    ABG: No results for input(s): "PHART", "PCO2ART", "PO2ART", "HCO3", "O2SAT" in the last 168 hours.  Liver Function Tests: No results for input(s): "AST", "ALT", "ALKPHOS", "BILITOT", "PROT", "ALBUMIN" in the last 168 hours. No results for input(s): "LIPASE", "AMYLASE" in the last 168 hours. No results for input(s): "AMMONIA" in the last 168 hours.  CBC: Recent Labs  Lab 12/12/22 0158 12/16/22 0225  WBC 6.3 5.6  HGB 8.9* 9.0*  HCT 27.6* 28.7*  MCV 96.5 99.0  PLT 171 185    Cardiac Enzymes: No results for input(s): "CKTOTAL", "CKMB", "CKMBINDEX", "TROPONINI" in the last 168 hours.  BNP (last 3 results) Recent Labs    08/21/22 0323 11/19/22 1617 11/20/22 0143  BNP 461.9* 512.4* 524.6*    ProBNP  (last 3 results) No results for input(s): "PROBNP" in the last 8760 hours.  Radiological Exams: No results found.  Assessment/Plan Active Problems:   Acute on chronic respiratory failure with hypoxia (HCC)   Bronchopneumonia   Acute on chronic combined systolic and diastolic CHF (congestive heart failure) (HCC)   Chronic pain syndrome   Chronic atrial fibrillation with RVR (HCC)   Acute on chronic respiratory failure with hypoxia plan is going to continue BiPAP as ordered will continue secretion management pulmonary toilet.  Patient does remain DNR however family has not decided on comfort measures Chronic pain syndrome pain management comfort care would be recommended Bronchopneumonia has been treated patient has underlying metastatic malignancy also Chronic atrial fibrillation rate is controlled at this time Chronic systolic and diastolic heart failure patient appears to be at baseline   I have personally seen and evaluated the patient, evaluated laboratory and imaging results, formulated the assessment and plan and placed orders. The Patient requires high complexity decision making with multiple systems involvement.  Rounds were done with the Respiratory Therapy Director and Staff therapists and discussed with nursing staff also.  Allyne Gee, MD [facility] Pulmonary Critical Care Medicine Sleep Medicine

## 2022-12-19 DIAGNOSIS — I482 Chronic atrial fibrillation, unspecified: Secondary | ICD-10-CM | POA: Diagnosis not present

## 2022-12-19 DIAGNOSIS — J180 Bronchopneumonia, unspecified organism: Secondary | ICD-10-CM | POA: Diagnosis not present

## 2022-12-19 DIAGNOSIS — I5043 Acute on chronic combined systolic (congestive) and diastolic (congestive) heart failure: Secondary | ICD-10-CM | POA: Diagnosis not present

## 2022-12-19 DIAGNOSIS — J9621 Acute and chronic respiratory failure with hypoxia: Secondary | ICD-10-CM | POA: Diagnosis not present

## 2022-12-19 NOTE — Progress Notes (Signed)
Pulmonary [name]  PROGRESS NOTE     LEGACEE CREEKMORE  [id number]  DOB: 1941-07-07   DOA: 11/16/2022  Referring Physician: Satira Sark, MD  HPI: Debra Ryan is a 82 y.o. female being followed for ventilator/airway/oxygen weaning Acute on Chronic Respiratory Failure.  Patient is currently on BiPAP has been on 35% FiO2  Medications: Reviewed on Rounds  Physical Exam:  Vitals: Temperature is 98.8 pulse 100 respiratory rate 17 blood pressure 103/56 saturation 96%  Ventilator Settings on BiPAP FiO2 35% pressure 16/6  General: Comfortable at this time Neck: supple Cardiovascular: no malignant arrhythmias Respiratory: Scattered rhonchi very coarse breath sounds Skin: no rash seen on limited exam Musculoskeletal: No gross abnormality Psychiatric:unable to assess Neurologic:no involuntary movements         Lab Data:   Basic Metabolic Panel: Recent Labs  Lab 12/16/22 0225  NA 137  K 3.6  CL 96*  CO2 33*  GLUCOSE 121*  BUN 29*  CREATININE 1.22*  CALCIUM 8.4*  MG 2.0    ABG: No results for input(s): "PHART", "PCO2ART", "PO2ART", "HCO3", "O2SAT" in the last 168 hours.  Liver Function Tests: No results for input(s): "AST", "ALT", "ALKPHOS", "BILITOT", "PROT", "ALBUMIN" in the last 168 hours. No results for input(s): "LIPASE", "AMYLASE" in the last 168 hours. No results for input(s): "AMMONIA" in the last 168 hours.  CBC: Recent Labs  Lab 12/16/22 0225  WBC 5.6  HGB 9.0*  HCT 28.7*  MCV 99.0  PLT 185    Cardiac Enzymes: No results for input(s): "CKTOTAL", "CKMB", "CKMBINDEX", "TROPONINI" in the last 168 hours.  BNP (last 3 results) Recent Labs    08/21/22 0323 11/19/22 1617 11/20/22 0143  BNP 461.9* 512.4* 524.6*    ProBNP (last 3 results) No results for input(s): "PROBNP" in the last 8760 hours.  Radiological Exams: No results  found.  Assessment/Plan Active Problems:   Acute on chronic respiratory failure with hypoxia (HCC)   Bronchopneumonia   Acute on chronic combined systolic and diastolic CHF (congestive heart failure) (HCC)   Chronic pain syndrome   Chronic atrial fibrillation with RVR (HCC)   Acute on chronic respiratory failure hypoxia plan is going to continue with BiPAP as tolerated patient has waxing waning status Combined systolic diastolic heart failure appears to be compensated continue with fluid management Chronic pain syndrome pain management supportive care Metastatic cancer with multiple nodules noted on the chest x-ray and a CT Pleural effusion patient had pleural effusion will need thoracentesis   I have personally seen and evaluated the patient, evaluated laboratory and imaging results, formulated the assessment and plan and placed orders. The Patient requires high complexity decision making with multiple systems involvement.  Rounds were done with the Respiratory Therapy Director and Staff therapists and discussed with nursing staff also.  Allyne Gee, MD [facility] Pulmonary Critical Care Medicine Sleep Medicine

## 2022-12-20 ENCOUNTER — Other Ambulatory Visit (HOSPITAL_COMMUNITY): Payer: Medicare Other

## 2022-12-20 DIAGNOSIS — I5043 Acute on chronic combined systolic (congestive) and diastolic (congestive) heart failure: Secondary | ICD-10-CM | POA: Diagnosis not present

## 2022-12-20 DIAGNOSIS — J9621 Acute and chronic respiratory failure with hypoxia: Secondary | ICD-10-CM | POA: Diagnosis not present

## 2022-12-20 DIAGNOSIS — I482 Chronic atrial fibrillation, unspecified: Secondary | ICD-10-CM | POA: Diagnosis not present

## 2022-12-20 DIAGNOSIS — J180 Bronchopneumonia, unspecified organism: Secondary | ICD-10-CM | POA: Diagnosis not present

## 2022-12-30 NOTE — Progress Notes (Addendum)
[facility] [place]  PROGRESS NOTE     Debra Ryan  [id number]  DOB: 02-05-1941   DOA: 11/19/2022  Referring Physician: Satira Sark, MD  HPI: Debra Ryan is a 82 y.o. female being followed for ventilator/airway/oxygen weaning Acute on Chronic Respiratory Failure.  Patient seen lying in bed, currently on nonrebreather 15 L.  Patient continues to request the use of BiPAP.  Medications: Reviewed on Rounds  Physical Exam:  Vitals: Temp 97.4, pulse 88, respirations 19, BP 152/93, SpO2 96%  Ventilator Settings 15 L nonrebreather  General: Comfortable at this time Neck: supple Cardiovascular: no malignant arrhythmias Respiratory: Bilaterally coarse Skin: no rash seen on limited exam Musculoskeletal: No gross abnormality Psychiatric:unable to assess Neurologic:no involuntary movements         Lab Data:   Basic Metabolic Panel: Recent Labs  Lab 12/16/22 0225  NA 137  K 3.6  CL 96*  CO2 33*  GLUCOSE 121*  BUN 29*  CREATININE 1.22*  CALCIUM 8.4*  MG 2.0    ABG: No results for input(s): "PHART", "PCO2ART", "PO2ART", "HCO3", "O2SAT" in the last 168 hours.  Liver Function Tests: No results for input(s): "AST", "ALT", "ALKPHOS", "BILITOT", "PROT", "ALBUMIN" in the last 168 hours. No results for input(s): "LIPASE", "AMYLASE" in the last 168 hours. No results for input(s): "AMMONIA" in the last 168 hours.  CBC: Recent Labs  Lab 12/16/22 0225  WBC 5.6  HGB 9.0*  HCT 28.7*  MCV 99.0  PLT 185    Cardiac Enzymes: No results for input(s): "CKTOTAL", "CKMB", "CKMBINDEX", "TROPONINI" in the last 168 hours.  BNP (last 3 results) Recent Labs    08/21/22 0323 11/19/22 1617 11/20/22 0143  BNP 461.9* 512.4* 524.6*    ProBNP (last 3 results) No results for input(s): "PROBNP" in the last 8760 hours.  Radiological Exams: No results  found.  Assessment/Plan Active Problems:   Acute on chronic respiratory failure with hypoxia (HCC)   Bronchopneumonia   Acute on chronic combined systolic and diastolic CHF (congestive heart failure) (HCC)   Chronic pain syndrome   Chronic atrial fibrillation with RVR (HCC)   Acute on chronic respiratory failure hypoxia-currently on 15 L nonrebreather.  Patient continues to request the use of BiPAP. Combined systolic diastolic heart failure appears to be compensated continue with fluid management Chronic pain syndrome pain management supportive care Metastatic cancer with multiple nodules noted on the chest x-ray and a CT Pleural effusion patient had pleural effusion will need thoracentesis  I have personally seen and evaluated the patient, evaluated laboratory and imaging results, formulated the assessment and plan and placed orders. The Patient requires high complexity decision making with multiple systems involvement.  Rounds were done with the Respiratory Therapy Director and Staff therapists and discussed with nursing staff also.  Allyne Gee, MD [facility] Pulmonary Critical Care Medicine Sleep Medicine

## 2022-12-30 DEATH — deceased
